# Patient Record
Sex: Male | Born: 1985 | Race: White | Hispanic: No | Marital: Married | Smoking: Never smoker
Health system: Southern US, Community
[De-identification: ages and names within clinical notes are randomized; demographics above are authoritative.]

## PROBLEM LIST (undated history)

## (undated) DIAGNOSIS — M109 Gout, unspecified: Secondary | ICD-10-CM

## (undated) DIAGNOSIS — E079 Disorder of thyroid, unspecified: Secondary | ICD-10-CM

## (undated) HISTORY — DX: Disorder of thyroid, unspecified: E07.9

## (undated) HISTORY — DX: Gout, unspecified: M10.9

## (undated) HISTORY — PX: VASECTOMY: SHX75

---
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
## 2010-12-29 HISTORY — PX: RETINAL DETACHMENT SURGERY: SHX105

## 2010-12-29 HISTORY — PX: LASIK: SHX215

## 2010-12-29 HISTORY — PX: EYE SURGERY: SHX253

## 2011-12-30 HISTORY — PX: RHINOPLASTY: SHX2354

## 2012-12-09 ENCOUNTER — Ambulatory Visit: Payer: Self-pay | Admitting: Family Medicine

## 2013-03-03 ENCOUNTER — Ambulatory Visit: Payer: Self-pay | Admitting: Otolaryngology

## 2013-03-04 LAB — PATHOLOGY REPORT

## 2013-12-13 ENCOUNTER — Ambulatory Visit (INDEPENDENT_AMBULATORY_CARE_PROVIDER_SITE_OTHER): Payer: 59

## 2013-12-13 ENCOUNTER — Ambulatory Visit (INDEPENDENT_AMBULATORY_CARE_PROVIDER_SITE_OTHER): Payer: 59 | Admitting: Podiatry

## 2013-12-13 ENCOUNTER — Encounter: Payer: Self-pay | Admitting: Podiatry

## 2013-12-13 VITALS — BP 136/86 | HR 66 | Resp 16 | Ht 70.0 in | Wt 250.0 lb

## 2013-12-13 DIAGNOSIS — M79609 Pain in unspecified limb: Secondary | ICD-10-CM

## 2013-12-13 DIAGNOSIS — M79672 Pain in left foot: Secondary | ICD-10-CM

## 2013-12-13 DIAGNOSIS — M109 Gout, unspecified: Secondary | ICD-10-CM

## 2013-12-13 DIAGNOSIS — M779 Enthesopathy, unspecified: Secondary | ICD-10-CM

## 2013-12-13 DIAGNOSIS — S93409A Sprain of unspecified ligament of unspecified ankle, initial encounter: Secondary | ICD-10-CM

## 2013-12-13 MED ORDER — TRIAMCINOLONE ACETONIDE 10 MG/ML IJ SUSP
10.0000 mg | Freq: Once | INTRAMUSCULAR | Status: AC
  Administered 2013-12-13: 10 mg

## 2013-12-13 MED ORDER — DICLOFENAC SODIUM 75 MG PO TBEC: 75.0000 mg | DELAYED_RELEASE_TABLET | Freq: Two times a day (BID) | ORAL | Status: DC

## 2013-12-13 NOTE — Progress Notes (Signed)
   Subjective:    Patient ID: Oscar Allen, male    DOB: June 30, 1986, 27 y.o.   MRN: [id number]  HPI Comments: N PAIN L LEFT FOOT AND ANKLE D 1 YR O SLOWLY C WORSE A WALKING, ALL MOTIONS OF FOOT T IBUPROFEN, ICE, STRETCHING,     Foot Pain Associated symptoms include headaches.      Review of Systems  Constitutional: Negative.   Eyes: Negative.   Respiratory: Negative.   Cardiovascular: Negative.   Gastrointestinal: Positive for constipation.  Endocrine: Negative.   Genitourinary: Negative.   Musculoskeletal:       JOINT PAIN DIFFICULTY WALKING  Skin: Negative.   Allergic/Immunologic: Negative.   Neurological: Positive for headaches.  Hematological: Negative.   Psychiatric/Behavioral: Negative.        Objective:   Physical Exam        Assessment & Plan:

## 2013-12-13 NOTE — Progress Notes (Signed)
Subjective:     Patient ID: Oscar Allen, male   DOB: 10/28/1986, 27 y.o.   MRN: [id number]  Foot Pain   patient presents stating I'm having a lot of pain in my left ankle and now on the outside of my left foot has become sore patient states that this has been going on for about a year after helping a friend move and he did have it checked at that time and he did have elevated uric acid on blood work .patient states it's worse with activity    Review of Systems  All other systems reviewed and are negative.       Objective:   Physical Exam  Nursing note and vitals reviewed. Constitutional: He is oriented to person, place, and time.  Cardiovascular: Intact distal pulses.   Musculoskeletal: Normal range of motion.  Neurological: He is oriented to person, place, and time.  Skin: Skin is warm.   neurovascular status intact with no muscle strength loss or loss of motion in the subtalar midtarsal joint. No equinus condition no crepitus was noted upon inversion eversion of the subtalar or ankle joint pain is present underneath the fibula on the lateral side of the ankle and at the peroneal brevis insertion fifth metatarsal     Assessment:    Cannot rule out systemic disease versus localized inflammation versus possibility for some kind of subtle arthritis local process     Plan:     H&P and foot and ankle x-rays reviewed. Today I injected the lateral gutter of the fibula 3 mg Kenalog 500 Xylocaine Marcaine mixture and at the peroneal base two milligrams Kenalog 3 mg Xylocaine and dispensed air fracture walker for complete immobilization of the ankle and placed on Voltaren 75 mg twice a day. I am sending for blood work to rule out systemic disease

## 2013-12-14 LAB — ANA: Anti Nuclear Antibody(ANA): NEGATIVE

## 2013-12-14 LAB — RHEUMATOID FACTOR: Rhuematoid fact SerPl-aCnc: 10 IU/mL (ref 0.0–13.9)

## 2013-12-14 LAB — C-REACTIVE PROTEIN: CRP: 5.8 mg/L — ABNORMAL HIGH (ref 0.0–4.9)

## 2013-12-16 ENCOUNTER — Telehealth: Payer: Self-pay | Admitting: *Deleted

## 2013-12-16 NOTE — Telephone Encounter (Signed)
Pt called was in severe pain, spoke with dr Charlsie Merles about this i called him in a prescription to [facility] sterapred dos pak 12 day. He is away for the holidays , also gave him the results of his uric acid which was elevated.

## 2013-12-30 ENCOUNTER — Telehealth: Payer: Self-pay | Admitting: *Deleted

## 2013-12-30 MED ORDER — PREDNISONE 10 MG PO TABS: ORAL_TABLET | ORAL | Status: DC

## 2013-12-30 NOTE — Telephone Encounter (Signed)
Patient called requesting possible meds or appt. States that he does have appt on Monday with Dr Milinda Pointer in [place], but his foot was hurting a lot. States that the 5th MPJ is sore. Wearing an air fracture walker and taking Diclofenac Rx'd by Dr Paulla Dolly at last visit. Requesting something else to help until appt on Monday. I adivsed to continue with boot and I would check with the doc in office today.

## 2013-12-30 NOTE — Telephone Encounter (Signed)
E-scribed Sterapred dose pack 12 day per Dr Valentina Lucks. Called pt to let him know. Also advised to continue with air fracture walker and rest as much as possible over the weekend.

## 2014-01-02 ENCOUNTER — Ambulatory Visit (INDEPENDENT_AMBULATORY_CARE_PROVIDER_SITE_OTHER): Payer: 59

## 2014-01-02 ENCOUNTER — Ambulatory Visit (INDEPENDENT_AMBULATORY_CARE_PROVIDER_SITE_OTHER): Payer: 59 | Admitting: Podiatry

## 2014-01-02 ENCOUNTER — Encounter: Payer: Self-pay | Admitting: Podiatry

## 2014-01-02 ENCOUNTER — Ambulatory Visit: Payer: Self-pay

## 2014-01-02 VITALS — BP 146/93 | HR 75 | Resp 16

## 2014-01-02 DIAGNOSIS — M7750 Other enthesopathy of unspecified foot: Secondary | ICD-10-CM

## 2014-01-02 DIAGNOSIS — M79609 Pain in unspecified limb: Secondary | ICD-10-CM

## 2014-01-02 DIAGNOSIS — M79672 Pain in left foot: Secondary | ICD-10-CM

## 2014-01-02 MED ORDER — OXYCODONE-ACETAMINOPHEN 10-325 MG PO TABS
ORAL_TABLET | ORAL | Status: DC
Start: 2014-01-02 — End: 2015-08-02

## 2014-01-02 NOTE — Progress Notes (Signed)
   Subjective:    Patient ID: Oscar Allen, male    DOB: [birth year], 28 y.o.   MRN: [id number]  HPI Comments: Saw dr Paulla Dolly back in December was diagnosed with gout still having a ton of pain ,its staying around the 5th met lateral side and radiates down the side of the foot, gets worse at night, been using the boot and diclofenac , took the first round of steroid and it tore up my stomach. Did not take the second round that dr egerton prescribed.     Review of Systems     Objective:   Physical Exam: I have reviewed his past medical history medications allergies surgical history and labs. He continues to have pain about the fifth metatarsal and fifth metatarsal base as well as the peroneal tendons. Elevated uric acid levels are indicative of gout however his pain has changed very little and on for evaluation I see no signs of typical gout. He has tenderness on palpation of the fifth metatarsal base of the left foot. He also has pain on plantar flexion and abduction against resistance as well as pain on abduction against resistance. There is no overlying edema. Radiographic evaluation does not demonstrate any type of osseous abnormalities other than some soft tissue increase in density of the peroneal tendons as they course beneath the lateral malleolus extending to the level of the arcuate portion of the cuboid and the fifth metatarsal base.        Assessment & Plan:  Assessment: At this point I am concerned that he has rather gout but a tear of the peroneal brevis or peroneal longus tendons.  Plan: We discussed the etiology pathology conservative versus surgical therapies. We also discussed the fact that he has hyperuricemia and this very well may need to be treated as well. But at this point since the boot seems to help particularly with the tendons, I feel we should leave him in the boot for another 6 weeks. I also wrote her prescription for Percocet 7.5-325 mg #60, take 1-2 tablets by  mouth every 6-8 hours.

## 2014-01-04 ENCOUNTER — Ambulatory Visit: Payer: 59 | Admitting: Podiatry

## 2014-02-08 ENCOUNTER — Ambulatory Visit (INDEPENDENT_AMBULATORY_CARE_PROVIDER_SITE_OTHER): Payer: 59

## 2014-02-08 ENCOUNTER — Ambulatory Visit (INDEPENDENT_AMBULATORY_CARE_PROVIDER_SITE_OTHER): Payer: 59 | Admitting: Podiatry

## 2014-02-08 ENCOUNTER — Encounter: Payer: Self-pay | Admitting: Podiatry

## 2014-02-08 VITALS — BP 134/91 | HR 77 | Resp 16

## 2014-02-08 DIAGNOSIS — M7750 Other enthesopathy of unspecified foot: Secondary | ICD-10-CM

## 2014-02-08 DIAGNOSIS — M7670 Peroneal tendinitis, unspecified leg: Secondary | ICD-10-CM

## 2014-02-08 DIAGNOSIS — M109 Gout, unspecified: Secondary | ICD-10-CM

## 2014-02-08 MED ORDER — DICLOFENAC SODIUM 75 MG PO TBEC: 75.0000 mg | DELAYED_RELEASE_TABLET | Freq: Two times a day (BID) | ORAL | Status: DC

## 2014-02-08 MED ORDER — OXYCODONE-ACETAMINOPHEN 10-325 MG PO TABS: ORAL_TABLET | ORAL | Status: DC

## 2014-02-08 NOTE — Progress Notes (Signed)
Two weeks ago it was doing real good and about 5-6 days ago it flared up again out of the blue. He denies fever chills nausea vomiting muscle aches or pains or any trauma to the foot. States that he may the overdone it while at a convention.  Objective: Vital signs are stable he is alert and oriented x3. There is no erythema edema cellulitis drainage or odor to the left foot. Pulses are palpable left foot. He still has pain on palpation of the peroneal tendons from the ankle distal to the fifth metatarsal into the cuboid. He also has tenderness on the dorsal aspect of the foot and within the mid arch of the foot deep. He does have a nonpulsatile soft movable mass to the dorsal aspect of the foot that is visible on radiograph. Radiograph does not demonstrate any type of osseous abnormalities. There is no erythema edema cellulitis drainage or odor to the foot.  Assessment: Probable peroneal tendinitis with split tear left.  Plan: Discussed etiology pathology conservative versus surgical therapies. At this point I'm going to send him for blood work consisting of an arthritic profile. I refilled his diclofenac as well as his Percocet. 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 He would like to take a look at his peroneal tendons to see if there is a tear. I encouraged him to do so. Should his blood work come back abnormal we will correct with medications if necessary. Otherwise at the blood work is normal an MRI of the left foot would be necessary.

## 2014-02-09 ENCOUNTER — Other Ambulatory Visit: Payer: Self-pay | Admitting: Podiatry

## 2014-02-09 LAB — C-REACTIVE PROTEIN: CRP: 4 mg/L (ref 0.0–4.9)

## 2014-02-09 LAB — URIC ACID: Uric Acid: 8.8 mg/dL — ABNORMAL HIGH (ref 3.7–8.6)

## 2014-02-09 LAB — ANA: ANA: NEGATIVE

## 2014-02-09 LAB — RHEUMATOID FACTOR: Rhuematoid fact SerPl-aCnc: 9.3 IU/mL (ref 0.0–13.9)

## 2014-02-09 LAB — SEDIMENTATION RATE: SED RATE: 21 mm/h — AB (ref 0–15)

## 2014-02-09 MED ORDER — COLCHICINE 0.6 MG PO TABS: 0.6000 mg | ORAL_TABLET | Freq: Every day | ORAL | Status: DC

## 2014-02-10 ENCOUNTER — Telehealth: Payer: Self-pay | Admitting: *Deleted

## 2014-02-10 NOTE — Telephone Encounter (Signed)
Left message for Oscar Allen regarding his lab results.

## 2014-02-10 NOTE — Telephone Encounter (Signed)
Message copied by Dierdre Searles on Fri Feb 10, 2014  8:33 AM ------      Message from: Tyson Dense T      Created: Thu Feb 09, 2014  5:21 PM       Hyper uricemia (gout) with elevated inflammatory markers.  Start on colchicine ------

## 2014-02-22 ENCOUNTER — Ambulatory Visit (INDEPENDENT_AMBULATORY_CARE_PROVIDER_SITE_OTHER): Payer: 59 | Admitting: Podiatry

## 2014-02-22 VITALS — BP 135/87 | HR 70 | Resp 16 | Ht 70.0 in | Wt 252.0 lb

## 2014-02-22 DIAGNOSIS — M7750 Other enthesopathy of unspecified foot: Secondary | ICD-10-CM

## 2014-02-22 DIAGNOSIS — S86319A Strain of muscle(s) and tendon(s) of peroneal muscle group at lower leg level, unspecified leg, initial encounter: Secondary | ICD-10-CM

## 2014-02-22 DIAGNOSIS — M7670 Peroneal tendinitis, unspecified leg: Secondary | ICD-10-CM

## 2014-02-22 DIAGNOSIS — S838X9A Sprain of other specified parts of unspecified knee, initial encounter: Secondary | ICD-10-CM

## 2014-02-22 DIAGNOSIS — S86819A Strain of other muscle(s) and tendon(s) at lower leg level, unspecified leg, initial encounter: Secondary | ICD-10-CM

## 2014-02-22 DIAGNOSIS — M109 Gout, unspecified: Secondary | ICD-10-CM

## 2014-02-22 MED ORDER — ALLOPURINOL 100 MG PO TABS: 100.0000 mg | ORAL_TABLET | Freq: Two times a day (BID) | ORAL | Status: DC

## 2014-02-23 NOTE — Progress Notes (Signed)
He presents today continually wearing the Cam Walker for his left foot secondary to peroneal tendinitis. He also has a history of gout in which she is taking colchicine. He's been taking colchicine daily with some mild stomach upset. He has related decrease in pain to the posterior lateral ankle and lateral left foot. He relates that one of his attendings I performed a diagnostic ultrasound over his lateral ankle and peroneal groove and it did appear to demonstrate a tear of his peroneal tendon.  Objective: Vital signs are stable alert and oriented x3. He has some tenderness on palpation of the peroneal tendons but appears to be less since then previously noted. Currently there is no erythema edema cellulitis drainage or odor to the first metatarsophalangeal joint lateral foot or ankle.  Assessment: History of gout with positive for elevated uric acid levels. Probable split tear of the peroneal tendon group left ankle.  Plan: Discussed etiology pathology conservative versus surgical therapies currently we are going to discontinue colchicine and start allopurinol 200 mg daily I will followup with him in 6 weeks to reevaluate and perform a blood uric acid level.

## 2014-04-05 ENCOUNTER — Ambulatory Visit: Payer: 59 | Admitting: Podiatry

## 2014-04-13 ENCOUNTER — Telehealth: Payer: Self-pay | Admitting: *Deleted

## 2014-04-13 MED ORDER — METHYLPREDNISOLONE (PAK) 4 MG PO TABS: ORAL_TABLET | ORAL | Status: DC

## 2014-04-13 NOTE — Telephone Encounter (Signed)
Pt called said he had a gout flare up and is taking colchicine and its not helping. Pt states he has had the flare up for one week in his great toe.  i left message for pt to return call.

## 2014-04-13 NOTE — Telephone Encounter (Signed)
Pt had gout flare up already taken colchicine .Marland Kitchen Ok per dr Valentina Lucks

## 2014-04-13 NOTE — Telephone Encounter (Signed)
Dr Milinda Pointer pt called said he has a gout flare up in his great toe and its been like that for one week. States he is taking colchicine and its not working. Wants to know whats next. i left him a message asking him to call [facility] office and make sure we have correct pharmacy in case he needed a different rx.

## 2014-04-13 NOTE — Telephone Encounter (Signed)
Steroid dose pack-- I think Jody already took care of this

## 2014-04-21 ENCOUNTER — Ambulatory Visit (INDEPENDENT_AMBULATORY_CARE_PROVIDER_SITE_OTHER): Payer: 59

## 2014-04-21 ENCOUNTER — Ambulatory Visit (INDEPENDENT_AMBULATORY_CARE_PROVIDER_SITE_OTHER): Payer: 59 | Admitting: Podiatrist

## 2014-04-21 VITALS — BP 125/86 | HR 70 | Resp 16

## 2014-04-21 DIAGNOSIS — M109 Gout, unspecified: Secondary | ICD-10-CM

## 2014-04-21 MED ORDER — METHYLPREDNISOLONE (PAK) 4 MG PO TABS: ORAL_TABLET | ORAL | Status: DC

## 2014-04-21 MED ORDER — HYDROCODONE-ACETAMINOPHEN 5-325 MG PO TABS: 1.0000 | ORAL_TABLET | ORAL | Status: DC | PRN

## 2014-04-21 MED ORDER — COLCHICINE 0.6 MG PO TABS: 0.6000 mg | ORAL_TABLET | Freq: Every day | ORAL | Status: DC

## 2014-04-21 NOTE — Patient Instructions (Signed)

## 2014-04-28 NOTE — Progress Notes (Signed)
Chief Complaint  Patient presents with  . Foot Pain    right great toe is still hurting, finished prednisone      HPI: Patient is 28 y.o. male who presents today for pain in his right foot.  He states it is still very painful despite taking the medication for gout.  His peroneal tendon feels fine now and he is having no further issues with it.  He is getting ready to go abroad on Monday for a rotation in school.       Physical Exam Neurovascular status intact and unchanged from his last visit.  He has pain on his right foot at the first MPJ and it is swollen, red and tender.      Assessment: gout flare right  Plan: injected the right foot with kenalog and marcaine mix.  Recommended a steroid dose pack and a rx was written.  He is to continue with the allopurinol as rx'd by dr. Milinda Pointer.  He will call if the pain does not improve.

## 2015-01-22 LAB — LIPID PANEL
Cholesterol: 192 mg/dL (ref 0–200)
HDL: 49 mg/dL (ref 35–70)
LDL CALC: 114 mg/dL
Triglycerides: 143 mg/dL (ref 40–160)

## 2015-02-11 ENCOUNTER — Ambulatory Visit: Payer: Self-pay | Admitting: Internal Medicine

## 2015-04-20 NOTE — Op Note (Signed)
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   OPERATION:   Septoplasty. Bilateral submucous resection of the inferior turbinates.   SURGEON:  Janalee Dane, MD  ANESTHESIA:  General  FINDINGS: The anterior septum on the left side, especially at Little's area, was very cobblestoned and the mucoperichondrium was very adherent there. I did send two pieces to Pathology for analysis. There were no overlying lesions. There was some crusting on the mucosa, but no obvious erosion or lesion. The septum was deviated to the left inferiorly, dislocated out of the maxillary crest, and to the right more superiorly. The inferior turbinates were severely hypertrophied.   DESCRIPTION OF PROCEDURE:  The patient was identified in the holding area and was brought back to the operating room in the supine position on the operating room table.  After general endotracheal anesthesia had been induced the patient was turned 90 degrees counter clockwise from anesthesia.  The nose was anesthetized with infraorbital nerve blocks and septal injection with 0.5% Lidocaine and 0.25% Bupivacaine mixed with 1:150,000 with Epinephrine and phenylephrine Lidocaine soaked pledgets, two on each side were placed and the face was prepped and draped in the usual fashion.  The pledgets were removed.  A 15 blade was used to make a left-sided hemitransfixion incision and septal mucoperichondrial mucoperiosteal leaflets elevated.  There was a large inferior spur that was resected with Jansen-Middleton forceps.  The remaining septum was deviated back and forth in an accordion like fashion.  The bony cartilaginous junction was then divided and a moderate amount of vomer and perpendicular  plate was taken down with Jansen-Middleton forceps, releasing the tension on the remaining septum.  The septum then swung back into the midline.  The septal leaflets were closed with quilting 4-0 chromic suture.  The left sided hemitransfixion incision was closed with 4-0 plain gut.  Attention was directed to the turbinates which had been previously injected on the left.  The head of the inferior turbinate on the left was incised with a 15 blade and the medial mucoperiosteum was elevated using a Psychologist, educational.  Once this had been elevated Knight scissors were used to resect the conchal bone and lateral mucoperiosteum.  The inferior margin of the remaining mucoperiosteum was then cauterized with suction cautery and Surgiflo was placed at the inferior to the inferior margin of the remaining inferior turbinate.  An identical procedure was performed on the right inferior turbinate with once again placement of Surgiflo along its inferior margin.  Temporary Telfa pledgets were then placed.  The patient was allowed to emerge from anesthesia, extubated in the operating room and taken to the recovery room in stable condition.  There were no complications.    BLOOD LOSS: 10 mL.    ____________________________ J. Nadeen Landau, MD jmc:dm D: 03/03/2013 14:14:00 ET T: 03/03/2013 14:50:00 ET JOB#: [id number]  cc: Janalee Dane, MD, <Dictator>  Nicholos Johns MD ELECTRONICALLY SIGNED 03/21/2013 7:45

## 2015-07-13 ENCOUNTER — Ambulatory Visit: Payer: Self-pay | Admitting: Family Medicine

## 2015-08-02 ENCOUNTER — Ambulatory Visit
Admission: EM | Admit: 2015-08-02 | Discharge: 2015-08-02 | Disposition: A | Discharge status: Home / Self Care | Payer: 59 | Attending: Internal Medicine | Admitting: Internal Medicine

## 2015-08-02 ENCOUNTER — Encounter: Payer: Self-pay | Admitting: Emergency Medicine

## 2015-08-02 ENCOUNTER — Ambulatory Visit: Payer: 59

## 2015-08-02 DIAGNOSIS — Z79899 Other long term (current) drug therapy: Secondary | ICD-10-CM | POA: Diagnosis not present

## 2015-08-02 DIAGNOSIS — M109 Gout, unspecified: Secondary | ICD-10-CM | POA: Insufficient documentation

## 2015-08-02 DIAGNOSIS — E079 Disorder of thyroid, unspecified: Secondary | ICD-10-CM | POA: Insufficient documentation

## 2015-08-02 DIAGNOSIS — J209 Acute bronchitis, unspecified: Secondary | ICD-10-CM | POA: Diagnosis not present

## 2015-08-02 DIAGNOSIS — J40 Bronchitis, not specified as acute or chronic: Secondary | ICD-10-CM

## 2015-08-02 DIAGNOSIS — R05 Cough: Secondary | ICD-10-CM | POA: Diagnosis present

## 2015-08-02 MED ORDER — HYDROCOD POLST-CPM POLST ER 10-8 MG/5ML PO SUER: 5.0000 mL | Freq: Two times a day (BID) | ORAL | Status: DC

## 2015-08-02 MED ORDER — IPRATROPIUM-ALBUTEROL 0.5-2.5 (3) MG/3ML IN SOLN
3.0000 mL | Freq: Once | RESPIRATORY_TRACT | Status: AC
  Administered 2015-08-02: 3 mL via RESPIRATORY_TRACT

## 2015-08-02 MED ORDER — ALBUTEROL SULFATE HFA 108 (90 BASE) MCG/ACT IN AERS: 1.0000 | INHALATION_SPRAY | Freq: Four times a day (QID) | RESPIRATORY_TRACT | Status: DC | PRN

## 2015-08-02 MED ORDER — AZITHROMYCIN 250 MG PO TABS: ORAL_TABLET | ORAL | Status: DC

## 2015-08-02 MED ORDER — PREDNISONE 20 MG PO TABS
ORAL_TABLET | ORAL | Status: DC
Start: 2015-08-02 — End: 2015-08-31

## 2015-08-02 NOTE — ED Provider Notes (Addendum)
CSN: [id number]     Arrival date & time 08/02/15  1804 History   First MD Initiated Contact with Patient 08/02/15 1833     Chief Complaint  Patient presents with  . 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 He's never had any lung issues in the past. Denies fever or chills. He states he is having difficulty taking in deep breaths. He has never smoked.  Past Medical History  Diagnosis Date  . Thyroid disorder   . Gout    Past Surgical History  Procedure Laterality Date  . Foot surgery Right   . Mandible fracture surgery    . Rhinoplasty    . Eye surgery Bilateral    History reviewed. No pertinent family history. History  Substance Use Topics  . Smoking status: Never Smoker   . Smokeless tobacco: Never Used  . Alcohol Use: Yes     Comment: OCCASIONALLY    Review of Systems  Respiratory: Positive for cough, chest tightness, shortness of breath and wheezing.   All other systems reviewed and are negative.   Allergies  Review of patient's allergies indicates no known allergies.  Home Medications   Prior to Admission medications   Medication Sig Start Date End Date Taking? Authorizing Provider  Acetaminophen (TYLENOL PO) Take by mouth as needed.    Historical Provider, MD  albuterol (PROVENTIL HFA;VENTOLIN HFA) 108 (90 BASE) MCG/ACT inhaler Inhale 1-2 puffs into the lungs every 6 (six) hours as needed for wheezing or shortness of breath. 08/02/15   Lorin Picket, PA-C  allopurinol (ZYLOPRIM) 100 MG tablet Take 1 tablet (100 mg total) by mouth 2 (two) times daily. 02/22/14   Max T Hyatt, DPM  Arginine 1000 MG TABS Take by mouth daily.    Historical Provider, MD  azithromycin (ZITHROMAX Z-PAK) 250 MG tablet Take per package instructions 08/02/15   Lorin Picket, PA-C   chlorpheniramine-HYDROcodone [facility]) 10-8 MG/5ML SUER Take 5 mLs by mouth 2 (two) times daily. 08/02/15   Lorin Picket, PA-C  cholecalciferol (VITAMIN D) 1000 UNITS tablet Take 1,000 Units by mouth daily.    Historical Provider, MD  clonazePAM (KLONOPIN) 0.5 MG tablet Take 0.5 mg by mouth as needed for anxiety.    Historical Provider, MD  diclofenac (VOLTAREN) 75 MG EC tablet Take 1 tablet (75 mg total) by mouth 2 (two) times daily. 12/13/13   Wallene Huh, DPM  diclofenac (VOLTAREN) 75 MG EC tablet Take 1 tablet (75 mg total) by mouth 2 (two) times daily. 02/08/14   Max T Hyatt, DPM  Ibuprofen (ADVIL PO) Take by mouth as needed.    Historical Provider, MD  levothyroxine (SYNTHROID, LEVOTHROID) 75 MCG tablet Take 75 mcg by mouth daily before breakfast.    Historical Provider, MD  Multiple Vitamins-Minerals (MULTIVITAMIN PO) Take by mouth daily.    Historical Provider, MD  predniSONE (DELTASONE) 20 MG tablet Take 2 tabs (40 mg total) daily for 4 days 08/02/15   Lorin Picket, PA-C   BP 137/75 mmHg  Pulse 56  Temp(Src) 98.3 F (36.8 C) (Oral)  Resp 16  SpO2 98% Physical Exam  Constitutional: He is oriented to person, place, and time. He appears well-developed and well-nourished.  HENT:  Head: Normocephalic and atraumatic.  Right Ear: External ear normal.  Left Ear: External ear normal.  Eyes: Pupils are equal, round,  and reactive to light.  Neck: Neck supple. No tracheal deviation present.  Cardiovascular: Normal rate, regular rhythm and normal heart sounds.  Exam reveals no gallop and no friction rub.   No murmur heard. Pulmonary/Chest: Effort normal. No respiratory distress. He has wheezes. He has no rales. He exhibits no tenderness.  Examination lungs harsh breath sounds throughout. He has loud rattly wheezing with expiration. No areas of consolidation or rales are appreciated.  Lymphadenopathy:    He has no cervical adenopathy.  Neurological: He is alert and  oriented to person, place, and time.  Skin: Skin is warm and dry.  Psychiatric: He has a normal mood and affect. His behavior is normal. Judgment and thought content normal.  Nursing note and vitals reviewed.   ED Course  Procedures (including critical care time) Labs Review Labs Reviewed - No data to display  Imaging Review No results found. 19:00 Medication Given TL  ipratropium-albuterol (DUONEB) 0.5-2.5 (3) MG/3ML nebulizer solution 3 mL - Dose: 3 mL ; Route: Nebulization ; Scheduled Time: 1900       MDM   1. Bronchitis with bronchospasm    Discharge Medication List as of 08/02/2015  7:41 PM    START taking these medications   Details  albuterol (PROVENTIL HFA;VENTOLIN HFA) 108 (90 BASE) MCG/ACT inhaler Inhale 1-2 puffs into the lungs every 6 (six) hours as needed for wheezing or shortness of breath., Starting 08/02/2015, Until Discontinued, Print    azithromycin (ZITHROMAX Z-PAK) 250 MG tablet Take per package instructions, Print    chlorpheniramine-HYDROcodone (TUSSIONEX PENNKINETIC ER) 10-8 MG/5ML SUER Take 5 mLs by mouth 2 (two) times daily., Starting 08/02/2015, Until Discontinued, Print    predniSONE (DELTASONE) 20 MG tablet Take 2 tabs (40 mg total) daily for 4 days, Print        Plan: 1. Test/x-ray results and diagnosis reviewed with patient 2. rx as per orders; risks, benefits, potential side effects reviewed with patient 3. Recommend supportive treatment with rest and fluids. 4. F/u with PCP or pulmonologist if not improving.  Lorin Picket, PA-C 08/02/15 1943  Lorin Picket, PA-C 08/06/15 [phone]

## 2015-08-02 NOTE — ED Notes (Signed)
Pt states that he has had a cough for 2 weeks with no relief of OTC medications.

## 2015-08-02 NOTE — Discharge Instructions (Signed)

## 2015-08-08 IMAGING — CR DG CHEST 2V
2 series · 2 of 2 positions shown · non-contrast
Comparison: None.

CLINICAL DATA: Productive cough with wheezing

EXAM:
CHEST  2 VIEW

[chest pa]
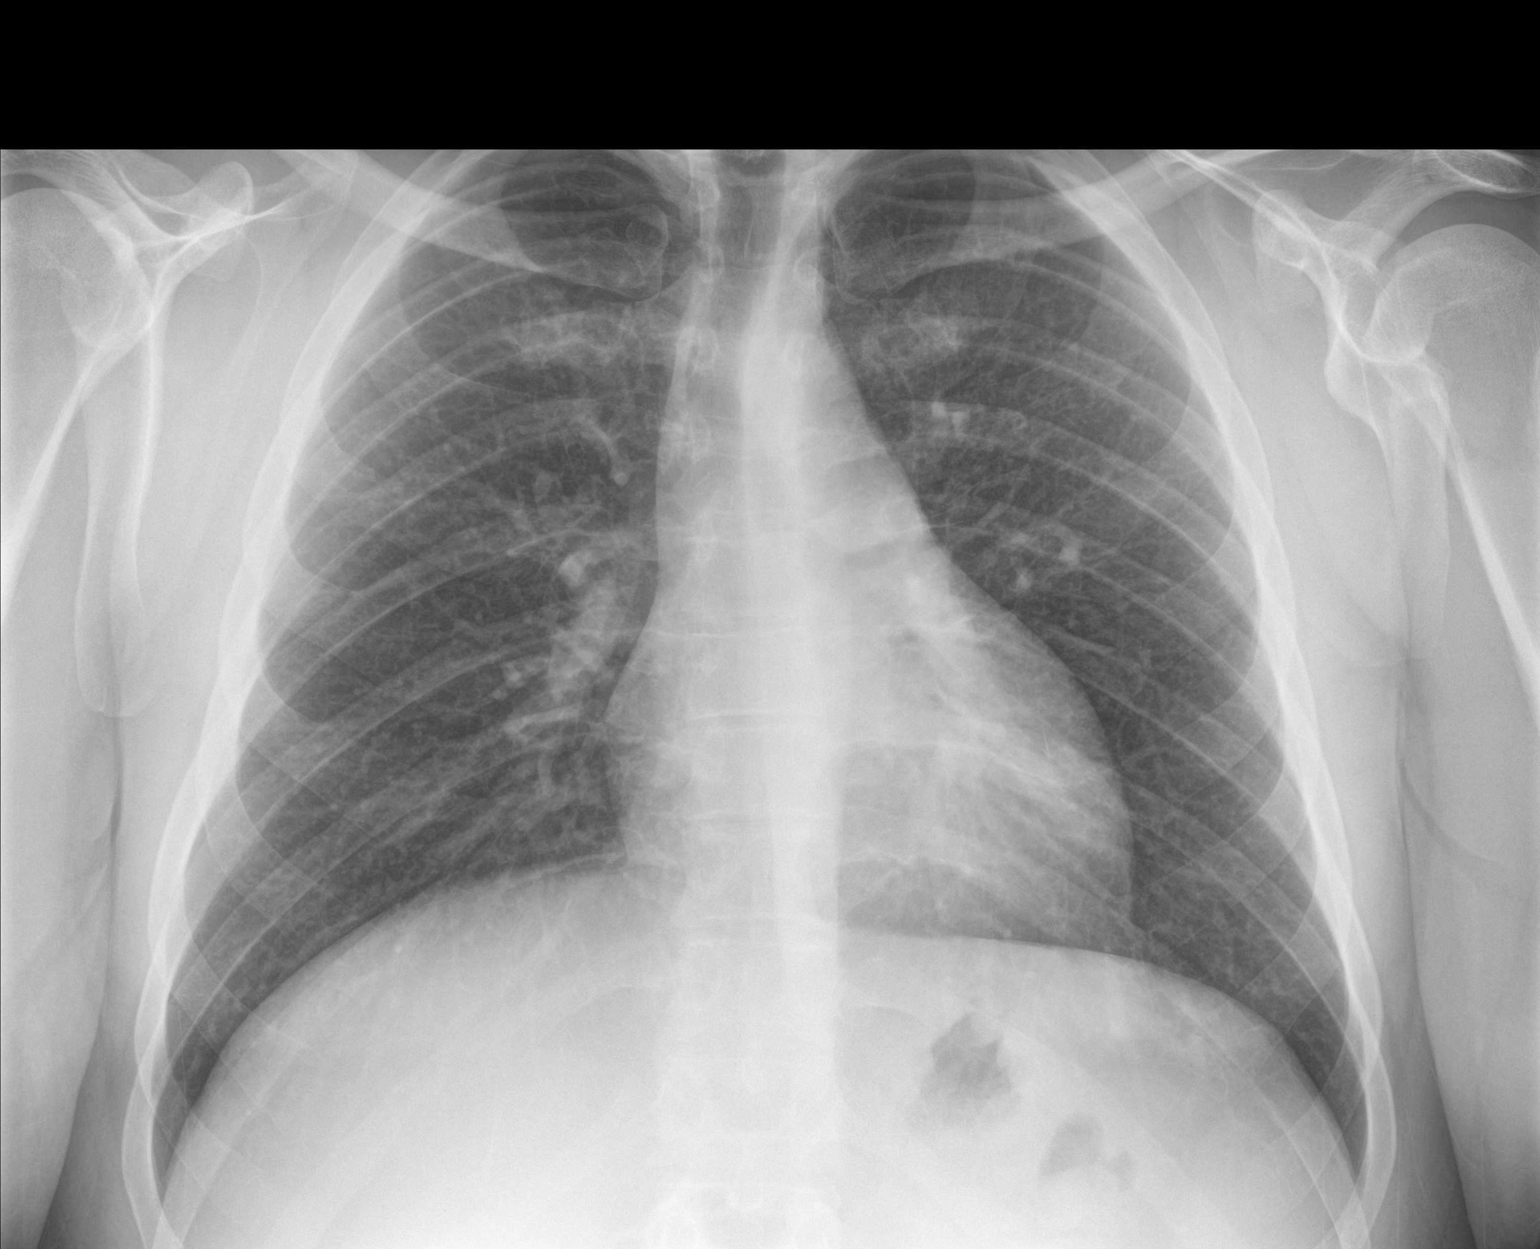

[chest lat]
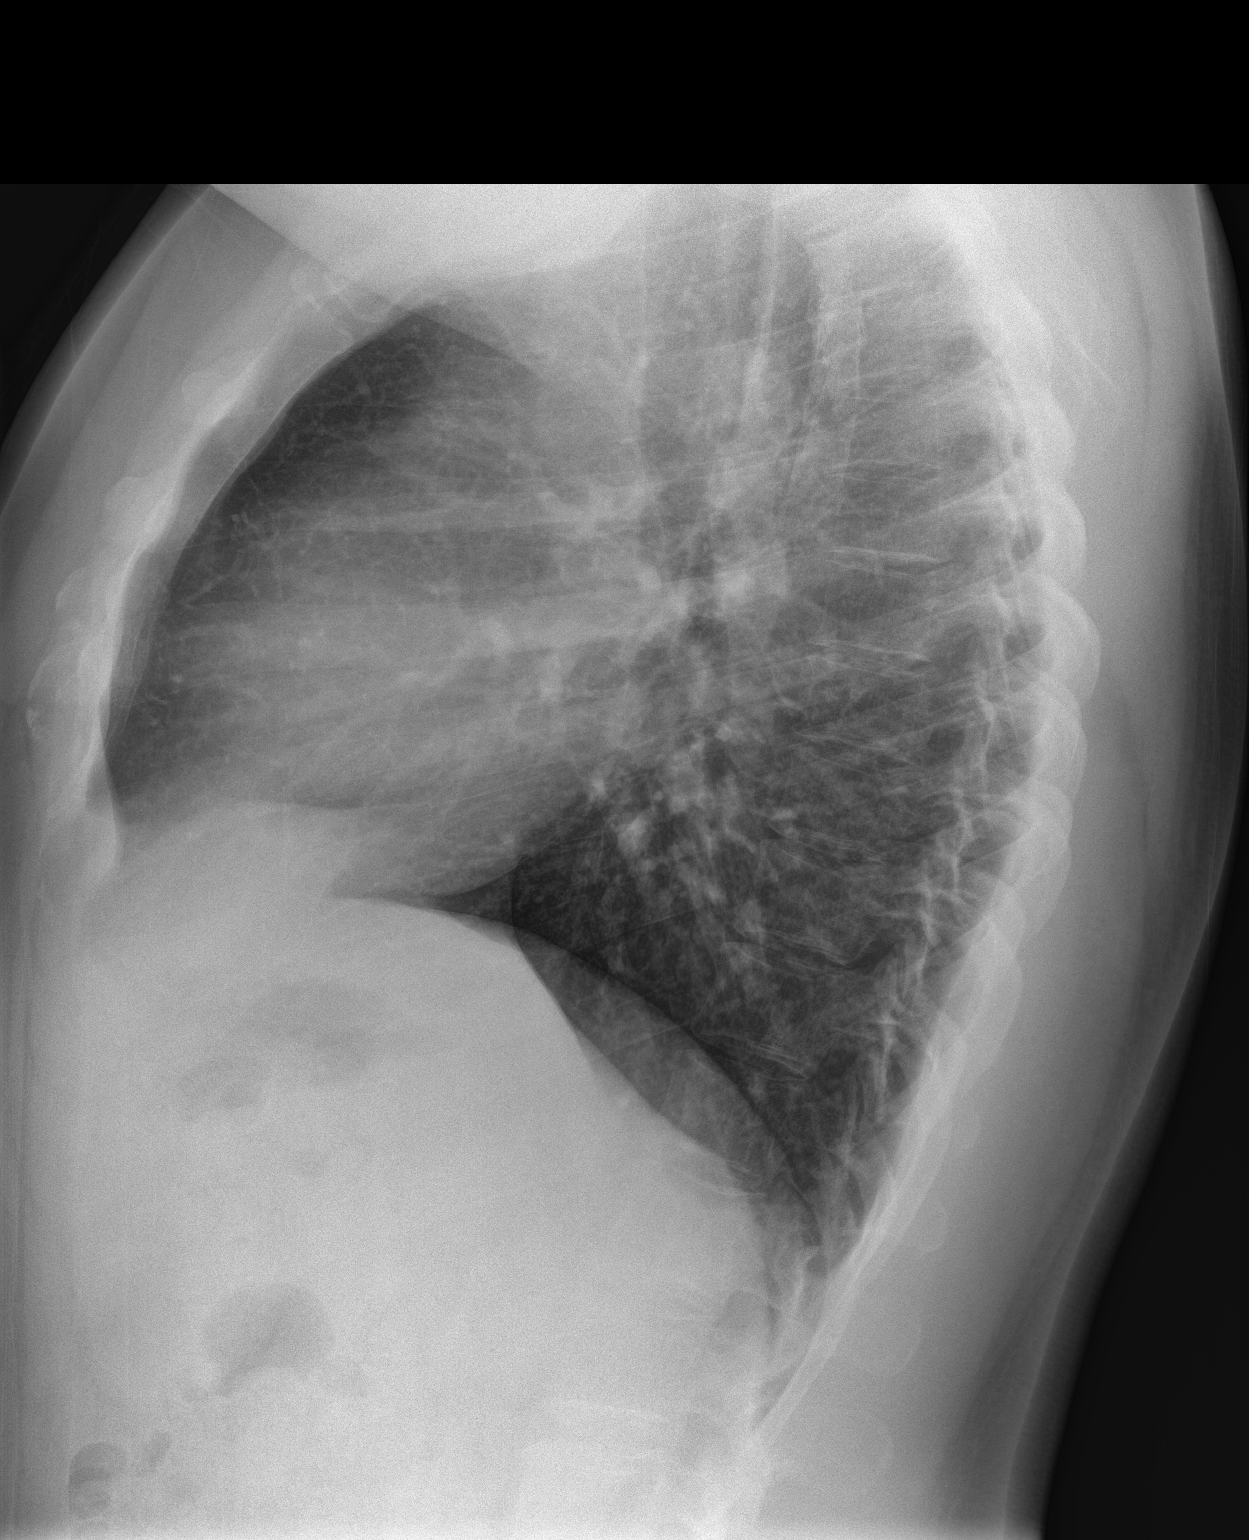

[2 of 2 positions shown; findings below may reference images not displayed]

FINDINGS: Prominent interstitial markings with tram tracking and bronchial
cuffing. No consolidation or collapse. No edema, effusion, or
pneumothorax. Normal heart size and mediastinal contours.
IMPRESSION: Bronchitis without pneumonia or collapse.

## 2015-08-26 ENCOUNTER — Encounter: Payer: Self-pay | Admitting: Family Medicine

## 2015-08-26 DIAGNOSIS — M109 Gout, unspecified: Secondary | ICD-10-CM | POA: Insufficient documentation

## 2015-08-26 DIAGNOSIS — E039 Hypothyroidism, unspecified: Secondary | ICD-10-CM | POA: Insufficient documentation

## 2015-08-26 DIAGNOSIS — Z8679 Personal history of other diseases of the circulatory system: Secondary | ICD-10-CM | POA: Insufficient documentation

## 2015-08-26 DIAGNOSIS — E669 Obesity, unspecified: Secondary | ICD-10-CM | POA: Insufficient documentation

## 2015-08-26 DIAGNOSIS — G4700 Insomnia, unspecified: Secondary | ICD-10-CM | POA: Insufficient documentation

## 2015-08-26 DIAGNOSIS — E538 Deficiency of other specified B group vitamins: Secondary | ICD-10-CM | POA: Insufficient documentation

## 2015-08-26 DIAGNOSIS — E559 Vitamin D deficiency, unspecified: Secondary | ICD-10-CM | POA: Insufficient documentation

## 2015-08-31 ENCOUNTER — Encounter: Payer: Self-pay | Admitting: Family Medicine

## 2015-08-31 ENCOUNTER — Ambulatory Visit (INDEPENDENT_AMBULATORY_CARE_PROVIDER_SITE_OTHER): Payer: 59 | Admitting: Family Medicine

## 2015-08-31 VITALS — BP 130/86 | HR 64 | Temp 98.6°F | Resp 16 | Ht 70.0 in | Wt 242.0 lb

## 2015-08-31 DIAGNOSIS — M109 Gout, unspecified: Secondary | ICD-10-CM

## 2015-08-31 DIAGNOSIS — E668 Other obesity: Secondary | ICD-10-CM | POA: Diagnosis not present

## 2015-08-31 DIAGNOSIS — I10 Essential (primary) hypertension: Secondary | ICD-10-CM

## 2015-08-31 DIAGNOSIS — R7989 Other specified abnormal findings of blood chemistry: Secondary | ICD-10-CM | POA: Diagnosis not present

## 2015-08-31 DIAGNOSIS — E559 Vitamin D deficiency, unspecified: Secondary | ICD-10-CM | POA: Diagnosis not present

## 2015-08-31 DIAGNOSIS — E038 Other specified hypothyroidism: Secondary | ICD-10-CM

## 2015-08-31 DIAGNOSIS — Z13220 Encounter for screening for lipoid disorders: Secondary | ICD-10-CM

## 2015-08-31 DIAGNOSIS — M1000 Idiopathic gout, unspecified site: Secondary | ICD-10-CM

## 2015-08-31 DIAGNOSIS — G4700 Insomnia, unspecified: Secondary | ICD-10-CM | POA: Diagnosis not present

## 2015-08-31 DIAGNOSIS — Z114 Encounter for screening for human immunodeficiency virus [HIV]: Secondary | ICD-10-CM | POA: Diagnosis not present

## 2015-08-31 DIAGNOSIS — E538 Deficiency of other specified B group vitamins: Secondary | ICD-10-CM

## 2015-08-31 DIAGNOSIS — IMO0002 Reserved for concepts with insufficient information to code with codable children: Secondary | ICD-10-CM

## 2015-08-31 MED ORDER — NALTREXONE-BUPROPION HCL ER 8-90 MG PO TB12: 2.0000 | ORAL_TABLET | Freq: Two times a day (BID) | ORAL | Status: DC

## 2015-08-31 MED ORDER — COLCHICINE 0.6 MG PO TABS: 1.2000 mg | ORAL_TABLET | Freq: Every day | ORAL | Status: DC | PRN

## 2015-08-31 MED ORDER — INDOMETHACIN 50 MG PO CAPS: 50.0000 mg | ORAL_CAPSULE | Freq: Three times a day (TID) | ORAL | Status: DC | PRN

## 2015-08-31 NOTE — Progress Notes (Signed)
Name: Oscar Allen   MRN: [id number]    DOB: 08-17-1986   Date:08/31/2015       Progress Note  Subjective  Chief Complaint  Chief Complaint  Patient presents with  . Medication Refill    3 month F/U  . Hypothyroidism    Fatigue, Constipated, Bloated.  . Obesity    Doing well on medication, losted 14 pounds total, weigh has been fluctuating.  . Hip Pain    Right Hip, non traumatic. Onset this am, first episode, took Arginine.    HPI  Hypothyroidism: taking medication as prescribed, he had bronchitis a few weeks ago and is feeling more tired than usual.   Obesity : started on Comtrave at the end of July and is finally on the two pills twice daily, he has been compliant, he is logging his diet on [employer]. He states medication has helped control his appetite and has lost 5 lbs since last visit. Goal is 5% of weight loss in 3 months, so he is on tract to get there.  He is also exercising, going to the gym five days weekly .   Hip Pain: he has history of gout and woke up at 4: 30 am with right hip pain, he took colchicine and is feeling better. He states he ate a burger and drank beer last night.   Fatigue: girlfriend has noticed her falls asleep fast, snores at times . ESS today was 10. He may have sleep apnea, but he wants to try weight loss first and if no improvement we will do a sleep study .   Patient Active Problem List   Diagnosis Date Noted  . Insomnia, persistent 08/26/2015  . Benign hypertension 08/26/2015  . Adult hypothyroidism 08/26/2015  . Low serum cobalamin 08/26/2015  . Adult BMI 30+ 08/26/2015  . Gout of big toe 08/26/2015  . Vitamin D deficiency 08/26/2015    Past Surgical History  Procedure Laterality Date  . Foot surgery Right   . Mandible fracture surgery    . Rhinoplasty    . Eye surgery Bilateral     Family History  Problem Relation Age of Onset  . Anxiety disorder Mother   . Hypothyroidism Father   . Heart Problems Brother      Social History   Social History  . Marital Status: Single    Spouse Name: N/A  . Number of Children: N/A  . Years of Education: N/A   Occupational History  . Not on file.   Social History Main Topics  . Smoking status: Never Smoker   . Smokeless tobacco: Never Used  . Alcohol Use: 0.0 oz/week    0 Standard drinks or equivalent per week     Comment: OCCASIONALLY  . Drug Use: No  . Sexual Activity:    Partners: Female   Other Topics Concern  . Not on file   Social History Narrative     Current outpatient prescriptions:  .  Acetaminophen (TYLENOL PO), Take by mouth as needed., Disp: , Rfl:  .  Ibuprofen (ADVIL PO), Take by mouth as needed., Disp: , Rfl:  .  levothyroxine (SYNTHROID, LEVOTHROID) 75 MCG tablet, Take 75 mcg by mouth daily before breakfast., Disp: , Rfl:  .  Multiple Vitamin tablet, Take by mouth., Disp: , Rfl:  .  Naltrexone-Bupropion HCl ER (CONTRAVE) 8-90 MG TB12, Take by mouth., Disp: , Rfl:  .  [DISCONTINUED] colchicine 0.6 MG tablet, Take 1 tablet (0.6 mg total) by mouth daily.,  Disp: 30 tablet, Rfl: 1  Allergies  Allergen Reactions  . Rhus Coriaria  [[place]]      ROS  Constitutional: Negative for fever positive for  weight change.  Respiratory: Negative for cough and shortness of breath.   Cardiovascular: Negative for chest pain or palpitations.  Gastrointestinal: Negative for abdominal pain, no bowel changes.  Musculoskeletal: Negative for gait problem or joint swelling.  Skin: Negative for rash.  Neurological: Negative for dizziness or headache.  No other specific complaints in a complete review of systems (except as listed in HPI above).  Objective  Filed Vitals:   08/31/15 0945  BP: 130/86  Pulse: 64  Temp: 98.6 F (37 C)  TempSrc: Oral  Resp: 16  Height: 5\' 10"  (1.778 m)  Weight: 242 lb (109.77 kg)  SpO2: 98%    Body mass index is 34.72 kg/(m^2).  Physical Exam  Constitutional: Patient appears well-developed and  well-nourished. Obese  No distress.  HEENT: head atraumatic, normocephalic, pupils equal and reactive to light, neck supple, throat within normal limits Cardiovascular: Normal rate, regular rhythm and normal heart sounds.  No murmur heard. No BLE edema. Pulmonary/Chest: Effort normal and breath sounds normal. No respiratory distress. Abdominal: Soft.  There is no tenderness. Psychiatric: Patient has a normal mood and affect. behavior is normal. Judgment and thought content normal. Muscular Skeletal: pain in right groin with internal and external rotation of right hip.    PHQ2/9: Depression screen PHQ 2/9 08/31/2015  Decreased Interest 0  Down, Depressed, Hopeless 0  PHQ - 2 Score 0    Fall Risk: Fall Risk  08/31/2015  Falls in the past year? No      Assessment & Plan  1. Benign hypertension Doing well with bp , off medication. Life style modification only  - Comprehensive metabolic panel  2. Other specified hypothyroidism  - TSH  3. Insomnia, persistent He falls asleep quickly but wakes up during the night, some snoring. ESS is 1 0 wakes up feeling tired, he will try losing weight and if needed check sleep study in 6 months  4. Low serum cobalamin  - Vitamin B12  5. Vitamin D deficiency  - Vit D  25 hydroxy (rtn osteoporosis monitoring)  6. Adult BMI 30+ Continue Contrave twice daily  Discussed all optiosns for weight loss medications including Belviq, Qsymia, Saxenda and Contrave. Discussed risk and benefits of each of them. - Hemoglobin A1c  7. Gout of big toe  - Uric acid  8. Lipid screening  - Lipid panel  9. Encounter for screening for HIV  - HIV antibody

## 2015-09-01 LAB — TSH: TSH: 2.06 u[IU]/mL (ref 0.450–4.500)

## 2015-09-01 LAB — HEMOGLOBIN A1C
Est. average glucose Bld gHb Est-mCnc: 111 mg/dL
HEMOGLOBIN A1C: 5.5 % (ref 4.8–5.6)

## 2015-09-01 LAB — VITAMIN B12: Vitamin B-12: 597 pg/mL (ref 211–946)

## 2015-09-01 LAB — COMPREHENSIVE METABOLIC PANEL
ALBUMIN: 4.6 g/dL (ref 3.5–5.5)
ALT: 33 IU/L (ref 0–44)
AST: 19 IU/L (ref 0–40)
Albumin/Globulin Ratio: 1.9 (ref 1.1–2.5)
Alkaline Phosphatase: 47 IU/L (ref 39–117)
BUN/Creatinine Ratio: 12 (ref 8–19)
BUN: 13 mg/dL (ref 6–20)
Bilirubin Total: 0.4 mg/dL (ref 0.0–1.2)
CALCIUM: 9.6 mg/dL (ref 8.7–10.2)
CO2: 25 mmol/L (ref 18–29)
CREATININE: 1.05 mg/dL (ref 0.76–1.27)
Chloride: 103 mmol/L (ref 97–108)
GFR calc Af Amer: 110 mL/min/{1.73_m2} (ref >59)
GFR, EST NON AFRICAN AMERICAN: 95 mL/min/{1.73_m2} (ref >59)
GLOBULIN, TOTAL: 2.4 g/dL (ref 1.5–4.5)
Glucose: 95 mg/dL (ref 65–99)
Potassium: 4.9 mmol/L (ref 3.5–5.2)
SODIUM: 143 mmol/L (ref 134–144)
Total Protein: 7 g/dL (ref 6.0–8.5)

## 2015-09-01 LAB — LIPID PANEL
CHOL/HDL RATIO: 4 ratio (ref 0.0–5.0)
Cholesterol, Total: 185 mg/dL (ref 100–199)
HDL: 46 mg/dL (ref >39)
LDL CALC: 116 mg/dL — AB (ref 0–99)
TRIGLYCERIDES: 114 mg/dL (ref 0–149)
VLDL CHOLESTEROL CAL: 23 mg/dL (ref 5–40)

## 2015-09-01 LAB — VITAMIN D 25 HYDROXY (VIT D DEFICIENCY, FRACTURES): Vit D, 25-Hydroxy: 20.8 ng/mL — ABNORMAL LOW (ref 30.0–100.0)

## 2015-09-01 LAB — URIC ACID: Uric Acid: 9.4 mg/dL — ABNORMAL HIGH (ref 3.7–8.6)

## 2015-09-01 LAB — HIV ANTIBODY (ROUTINE TESTING W REFLEX): HIV SCREEN 4TH GENERATION: NONREACTIVE

## 2015-09-04 NOTE — Progress Notes (Signed)
Pt.notified

## 2015-09-11 ENCOUNTER — Other Ambulatory Visit: Payer: Self-pay | Admitting: Family Medicine

## 2015-10-02 ENCOUNTER — Encounter: Payer: Self-pay | Admitting: Family Medicine

## 2015-10-19 ENCOUNTER — Ambulatory Visit: Payer: 59 | Admitting: Family Medicine

## 2015-11-02 ENCOUNTER — Ambulatory Visit (INDEPENDENT_AMBULATORY_CARE_PROVIDER_SITE_OTHER): Payer: 59 | Admitting: Family Medicine

## 2015-11-02 ENCOUNTER — Encounter: Payer: Self-pay | Admitting: Family Medicine

## 2015-11-02 VITALS — BP 130/86 | HR 64 | Temp 98.0°F | Resp 16 | Ht 70.0 in | Wt 247.7 lb

## 2015-11-02 DIAGNOSIS — E668 Other obesity: Secondary | ICD-10-CM | POA: Diagnosis not present

## 2015-11-02 DIAGNOSIS — G4700 Insomnia, unspecified: Secondary | ICD-10-CM

## 2015-11-02 DIAGNOSIS — E790 Hyperuricemia without signs of inflammatory arthritis and tophaceous disease: Secondary | ICD-10-CM

## 2015-11-02 DIAGNOSIS — R7989 Other specified abnormal findings of blood chemistry: Secondary | ICD-10-CM | POA: Diagnosis not present

## 2015-11-02 DIAGNOSIS — R61 Generalized hyperhidrosis: Secondary | ICD-10-CM

## 2015-11-02 DIAGNOSIS — I10 Essential (primary) hypertension: Secondary | ICD-10-CM | POA: Diagnosis not present

## 2015-11-02 DIAGNOSIS — Z23 Encounter for immunization: Secondary | ICD-10-CM | POA: Diagnosis not present

## 2015-11-02 DIAGNOSIS — IMO0002 Reserved for concepts with insufficient information to code with codable children: Secondary | ICD-10-CM

## 2015-11-02 MED ORDER — LORCASERIN HCL 10 MG PO TABS: 1.0000 | ORAL_TABLET | Freq: Two times a day (BID) | ORAL | Status: DC

## 2015-11-02 NOTE — Progress Notes (Signed)
Name: Oscar Allen   MRN: [id number]    DOB: [birth year]   Date:11/02/2015       Progress Note  Subjective  Chief Complaint  Chief Complaint  Patient presents with  . Follow-up    6 weeks  . Hypertension  . Obesity    currently out of contrave has gained 5pds since last visit  . Night Sweats    onset couple years off and on.  Patient wakes up with wet bed    HPI  HTN: on diet only, bp has been at goal, no chest pain or palpitatoin  Insomnia: he states recently he has been falling asleep but wakes up in the middle of the night with the entire mattress being sweaty. He does not recall any dreams. Girlfriend is worried that he may have leukemia. No easy bruising, no increase in fatigue. No weight loss.  Symptoms have been present for the past 5-6 years, he is not worried - intermittent symptoms, but his girlfriend is really worried  Obesity: he tried Contrave, but did not noticed any change in his appetite, out of medication past 2 weeks, and he has gained weight since last visit. He is still going to the gym a few times weekly , trying to eat healthy, but slacked off this past week.   Elevated Uric Acid: explained need to take allopurinol but he does not want to add medication, discussed risk of kidney stones   Patient Active Problem List   Diagnosis Date Noted  . Insomnia, persistent 08/26/2015  . Benign hypertension 08/26/2015  . Adult hypothyroidism 08/26/2015  . Low serum cobalamin 08/26/2015  . Adult BMI 30+ 08/26/2015  . Gout of big toe 08/26/2015  . Vitamin D deficiency 08/26/2015    Past Surgical History  Procedure Laterality Date  . Foot surgery Right   . Mandible fracture surgery    . Rhinoplasty    . Eye surgery Bilateral     Family History  Problem Relation Age of Onset  . Anxiety disorder Mother   . Hypothyroidism Father   . Heart Problems Brother     Social History   Social History  . Marital Status: Single    Spouse Name: N/A  . Number of  Children: N/A  . Years of Education: N/A   Occupational History  . Not on file.   Social History Main Topics  . Smoking status: Never Smoker   . Smokeless tobacco: Never Used  . Alcohol Use: 0.0 oz/week    0 Standard drinks or equivalent per week     Comment: OCCASIONALLY  . Drug Use: No  . Sexual Activity:    Partners: Female   Other Topics Concern  . Not on file   Social History Narrative     Current outpatient prescriptions:  .  colchicine 0.6 MG tablet, Take 2 tablets (1.2 mg total) by mouth daily as needed (and repat in one hour if no resolution of pain)., Disp: 30 tablet, Rfl: 0 .  Ibuprofen (ADVIL PO), Take by mouth as needed., Disp: , Rfl:  .  indomethacin (INDOCIN) 50 MG capsule, Take 1 capsule (50 mg total) by mouth 3 (three) times daily as needed., Disp: 30 capsule, Rfl: 0 .  Lorcaserin HCl (BELVIQ) 10 MG TABS, Take 1 tablet by mouth 2 (two) times daily., Disp: 60 tablet, Rfl: 1 .  Multiple Vitamin tablet, Take by mouth., Disp: , Rfl:  .  SYNTHROID 75 MCG tablet, TAKE 1 TABLET BY MOUTH ONCE DAILY, Disp:  90 tablet, Rfl: 1  Allergies  Allergen Reactions  . Rhus Coriaria  [[place]]      ROS  Constitutional: Negative for fever , positive for weight change.  Respiratory: Negative for cough and shortness of breath.   Cardiovascular: Negative for chest pain or palpitations.  Gastrointestinal: Negative for abdominal pain, no bowel changes.  Musculoskeletal: Negative for gait problem or joint swelling.  Skin: Negative for rash.  Neurological: Negative for dizziness or headache.  No other specific complaints in a complete review of systems (except as listed in HPI above).  Objective  Filed Vitals:   11/02/15 1003  BP: 130/86  Pulse: 64  Temp: 98 F (36.7 C)  TempSrc: Oral  Resp: 16  Height: 5\' 10"  (1.778 m)  Weight: 247 lb 11.2 oz (112.356 kg)  SpO2: 98%    Body mass index is 35.54 kg/(m^2).  Physical Exam  Constitutional: Patient appears  well-developed and well-nourished. Obese No distress.  HEENT: head atraumatic, normocephalic, pupils equal and reactive to light,  neck supple, throat within normal limits Cardiovascular: Normal rate, regular rhythm and normal heart sounds.  No murmur heard. No BLE edema. Pulmonary/Chest: Effort normal and breath sounds normal. No respiratory distress. Abdominal: Soft.  There is no tenderness. Psychiatric: Patient has a normal mood and affect. behavior is normal. Judgment and thought content normal.  Recent Results (from the past 2160 hour(s))  Comprehensive metabolic panel     Status: None   Collection Time: 08/31/15 11:05 AM  Result Value Ref Range   Glucose 95 65 - 99 mg/dL   BUN 13 6 - 20 mg/dL   Creatinine, Ser 1.05 0.76 - 1.27 mg/dL   GFR calc non Af Amer 95 >59 mL/min/1.73   GFR calc Af Amer 110 >59 mL/min/1.73   BUN/Creatinine Ratio 12 8 - 19   Sodium 143 134 - 144 mmol/L   Potassium 4.9 3.5 - 5.2 mmol/L   Chloride 103 97 - 108 mmol/L   CO2 25 18 - 29 mmol/L   Calcium 9.6 8.7 - 10.2 mg/dL   Total Protein 7.0 6.0 - 8.5 g/dL   Albumin 4.6 3.5 - 5.5 g/dL   Globulin, Total 2.4 1.5 - 4.5 g/dL   Albumin/Globulin Ratio 1.9 1.1 - 2.5   Bilirubin Total 0.4 0.0 - 1.2 mg/dL   Alkaline Phosphatase 47 39 - 117 IU/L   AST 19 0 - 40 IU/L   ALT 33 0 - 44 IU/L  Lipid panel     Status: Abnormal   Collection Time: 08/31/15 11:05 AM  Result Value Ref Range   Cholesterol, Total 185 100 - 199 mg/dL   Triglycerides 114 0 - 149 mg/dL   HDL 46 >39 mg/dL    Comment: According to ATP-III Guidelines, HDL-C >59 mg/dL is considered a negative risk factor for CHD.    VLDL Cholesterol Cal 23 5 - 40 mg/dL   LDL Calculated 116 (H) 0 - 99 mg/dL   Chol/HDL Ratio 4.0 0.0 - 5.0 ratio units    Comment:                                   T. Chol/HDL Ratio                                             Men  Women                               1/2 Avg.Risk  3.4    3.3                                    Avg.Risk  5.0    4.4                                2X Avg.Risk  9.6    7.1                                3X Avg.Risk 23.4   11.0   Vitamin B12     Status: None   Collection Time: 08/31/15 11:05 AM  Result Value Ref Range   Vitamin B-12 597 211 - 946 pg/mL  Vit D  25 hydroxy (rtn osteoporosis monitoring)     Status: Abnormal   Collection Time: 08/31/15 11:05 AM  Result Value Ref Range   Vit D, 25-Hydroxy 20.8 (L) 30.0 - 100.0 ng/mL    Comment: Vitamin D deficiency has been defined by the [place] and an Endocrine Society practice guideline as a level of serum 25-OH vitamin D less than 20 ng/mL (1,2). The Endocrine Society went on to further define vitamin D insufficiency as a level between 21 and 29 ng/mL (2). 1. IOM (Institute of Medicine). 2010. Dietary reference    intakes for calcium and D. [place]: The    [employer]. 2. Holick MF, Binkley [place], Bischoff-Ferrari HA, et al.    Evaluation, treatment, and prevention of vitamin D    deficiency: an Endocrine Society clinical practice    guideline. JCEM. 2011 Jul; 96(7):1911-30.   Hemoglobin A1c     Status: None   Collection Time: 08/31/15 11:05 AM  Result Value Ref Range   Hgb A1c MFr Bld 5.5 4.8 - 5.6 %    Comment:          Pre-diabetes: 5.7 - 6.4          Diabetes: >6.4          Glycemic control for adults with diabetes: <7.0    Est. average glucose Bld gHb Est-mCnc 111 mg/dL  Uric acid     Status: Abnormal   Collection Time: 08/31/15 11:05 AM  Result Value Ref Range   Uric Acid 9.4 (H) 3.7 - 8.6 mg/dL    Comment:            Therapeutic target for gout patients: <6.0  TSH     Status: None   Collection Time: 08/31/15 11:05 AM  Result Value Ref Range   TSH 2.060 0.450 - 4.500 uIU/mL  HIV antibody     Status: None   Collection Time: 08/31/15 11:05 AM  Result Value Ref Range   HIV Screen 4th Generation wRfx Non Reactive Non Reactive    PHQ2/9: Depression screen [facility] 2/9 11/02/2015 08/31/2015   Decreased Interest 0 0  Down, Depressed, Hopeless 0 0  PHQ - 2 Score 0 0    Fall Risk: Fall Risk  11/02/2015 08/31/2015  Falls in the past year? No No    Functional Status Survey: Is the patient deaf or have  difficulty hearing?: No Does the patient have difficulty seeing, even when wearing glasses/contacts?: No Does the patient have difficulty concentrating, remembering, or making decisions?: No Does the patient have difficulty walking or climbing stairs?: No Does the patient have difficulty dressing or bathing?: No Does the patient have difficulty doing errands alone such as visiting a doctor's office or shopping?: No    Assessment & Plan  1. Benign hypertension  Doing well on life style modification  2. Adult BMI 30+  - Lorcaserin HCl (BELVIQ) 10 MG TABS; Take 1 tablet by mouth 2 (two) times daily.  Dispense: 60 tablet; Refill: 1  3. Insomnia, persistent  From nigh sweats  4. Night sweats  - CBC with Differential/Platelet  5. Elevated uric acid in blood

## 2015-11-03 LAB — CBC WITH DIFFERENTIAL/PLATELET
BASOS: 0 %
Basophils Absolute: 0 10*3/uL (ref 0.0–0.2)
EOS (ABSOLUTE): 0.2 10*3/uL (ref 0.0–0.4)
EOS: 3 %
HEMATOCRIT: 43.7 % (ref 37.5–51.0)
HEMOGLOBIN: 14.7 g/dL (ref 12.6–17.7)
IMMATURE GRANS (ABS): 0 10*3/uL (ref 0.0–0.1)
IMMATURE GRANULOCYTES: 0 %
LYMPHS: 40 %
Lymphocytes Absolute: 2.5 10*3/uL (ref 0.7–3.1)
MCH: 27.3 pg (ref 26.6–33.0)
MCHC: 33.6 g/dL (ref 31.5–35.7)
MCV: 81 fL (ref 79–97)
MONOCYTES: 9 %
Monocytes Absolute: 0.6 10*3/uL (ref 0.1–0.9)
NEUTROS PCT: 48 %
Neutrophils Absolute: 2.9 10*3/uL (ref 1.4–7.0)
PLATELETS: 264 10*3/uL (ref 150–379)
RBC: 5.39 x10E6/uL (ref 4.14–5.80)
RDW: 14 % (ref 12.3–15.4)
WBC: 6.2 10*3/uL (ref 3.4–10.8)

## 2015-11-21 ENCOUNTER — Encounter: Payer: Self-pay | Admitting: Family Medicine

## 2015-12-14 ENCOUNTER — Ambulatory Visit (INDEPENDENT_AMBULATORY_CARE_PROVIDER_SITE_OTHER): Payer: 59 | Admitting: Family Medicine

## 2015-12-14 ENCOUNTER — Encounter: Payer: Self-pay | Admitting: Family Medicine

## 2015-12-14 VITALS — BP 122/64 | HR 58 | Temp 97.6°F | Resp 16 | Ht 70.0 in | Wt 251.5 lb

## 2015-12-14 DIAGNOSIS — R7989 Other specified abnormal findings of blood chemistry: Secondary | ICD-10-CM

## 2015-12-14 DIAGNOSIS — E668 Other obesity: Secondary | ICD-10-CM | POA: Diagnosis not present

## 2015-12-14 DIAGNOSIS — E538 Deficiency of other specified B group vitamins: Secondary | ICD-10-CM

## 2015-12-14 DIAGNOSIS — I10 Essential (primary) hypertension: Secondary | ICD-10-CM | POA: Diagnosis not present

## 2015-12-14 DIAGNOSIS — IMO0002 Reserved for concepts with insufficient information to code with codable children: Secondary | ICD-10-CM

## 2015-12-14 MED ORDER — B-12 1000 MCG SL SUBL: 1.0000 | SUBLINGUAL_TABLET | Freq: Every day | SUBLINGUAL | Status: DC

## 2015-12-14 MED ORDER — PHENTERMINE-TOPIRAMATE ER 3.75-23 MG PO CP24: 1.0000 | ORAL_CAPSULE | ORAL | Status: DC

## 2015-12-14 MED ORDER — PHENTERMINE-TOPIRAMATE ER 7.5-46 MG PO CP24: 1.0000 | ORAL_CAPSULE | Freq: Every day | ORAL | Status: DC

## 2015-12-14 NOTE — Progress Notes (Signed)
Name: Oscar Allen   MRN: [id number]    DOB: 17-Jul-1986   Date:12/14/2015       Progress Note  Subjective  Chief Complaint  Chief Complaint  Patient presents with  . Hypertension  . Obesity    belviq not working as well as Herbalist.  Patient states working out    HPI  Obesity: he tried Herbalist, but did not noticed any change in his appetite, also tool Belviq for the past 6 weeks and he has gained weight ( three pounds ). He states he is always hungry, usually worse after dinner.  He is still going to the gym a few times weekly and also doing some weight training twice a week at home. Eating a healthy breakfast, salads mostly for lunch and home cooked dinner, but feels hungry and snacks after dinner.   HTN: patient is on life style modification only, denies chest pain or palpitation, more cardiovascular exercises and heart rate and bp have improved  B12 deficiency: not taking supplements, explained the importance of getting SL B12  Patient Active Problem List   Diagnosis Date Noted  . Insomnia, persistent 08/26/2015  . Benign hypertension 08/26/2015  . Adult hypothyroidism 08/26/2015  . Low serum cobalamin 08/26/2015  . Adult BMI 30+ 08/26/2015  . Gout of big toe 08/26/2015  . Vitamin D deficiency 08/26/2015    Past Surgical History  Procedure Laterality Date  . Foot surgery Right   . Mandible fracture surgery    . Rhinoplasty    . Eye surgery Bilateral     Family History  Problem Relation Age of Onset  . Anxiety disorder Mother   . Hypothyroidism Father   . Heart Problems Brother     Social History   Social History  . Marital Status: Single    Spouse Name: N/A  . Number of Children: N/A  . Years of Education: N/A   Occupational History  . Not on file.   Social History Main Topics  . Smoking status: Never Smoker   . Smokeless tobacco: Never Used  . Alcohol Use: 0.0 oz/week    0 Standard drinks or equivalent per week     Comment: OCCASIONALLY  .  Drug Use: No  . Sexual Activity:    Partners: Female   Other Topics Concern  . Not on file   Social History Narrative     Current outpatient prescriptions:  .  colchicine 0.6 MG tablet, Take 2 tablets (1.2 mg total) by mouth daily as needed (and repat in one hour if no resolution of pain)., Disp: 30 tablet, Rfl: 0 .  Cyanocobalamin (B-12) 1000 MCG SUBL, Place 1 tablet under the tongue daily., Disp: 30 each, Rfl: 0 .  Ibuprofen (ADVIL PO), Take by mouth as needed., Disp: , Rfl:  .  indomethacin (INDOCIN) 50 MG capsule, Take 1 capsule (50 mg total) by mouth 3 (three) times daily as needed., Disp: 30 capsule, Rfl: 0 .  Phentermine-Topiramate (QSYMIA) 3.75-23 MG CP24, Take 1 tablet by mouth every morning., Disp: 14 capsule, Rfl: 0 .  Phentermine-Topiramate (QSYMIA) 7.5-46 MG CP24, Take 1 tablet by mouth daily., Disp: 30 capsule, Rfl: 0 .  SYNTHROID 75 MCG tablet, TAKE 1 TABLET BY MOUTH ONCE DAILY, Disp: 90 tablet, Rfl: 1  Allergies  Allergen Reactions  . Rhus Coriaria  [[place]]      ROS  Constitutional: Negative for fever or significant  weight change.  Respiratory: Negative for cough and shortness of breath.   Cardiovascular: Negative  for chest pain or palpitations.  Gastrointestinal: Negative for abdominal pain, no bowel changes.  Musculoskeletal: Negative for gait problem or joint swelling.  Skin: Negative for rash.  Neurological: Negative for dizziness or headache.  No other specific complaints in a complete review of systems (except as listed in HPI above).  Objective  Filed Vitals:   12/14/15 1405  BP: 122/64  Pulse: 58  Temp: 97.6 F (36.4 C)  TempSrc: Oral  Resp: 16  Height: 5\' 10"  (1.778 m)  Weight: 251 lb 8 oz (114.08 kg)  SpO2: 98%    Body mass index is 36.09 kg/(m^2).  Physical Exam  Constitutional: Patient appears well-developed and well-nourished. Obese  No distress.  HEENT: head atraumatic, normocephalic, pupils equal and reactive to light,  neck  supple, throat within normal limits Cardiovascular: Normal rate, regular rhythm and normal heart sounds.  No murmur heard. No BLE edema. Pulmonary/Chest: Effort normal and breath sounds normal. No respiratory distress. Abdominal: Soft.  There is no tenderness. Psychiatric: Patient has a normal mood and affect. behavior is normal. Judgment and thought content normal.  Recent Results (from the past 2160 hour(s))  CBC with Differential/Platelet     Status: None   Collection Time: 11/02/15 10:50 AM  Result Value Ref Range   WBC 6.2 3.4 - 10.8 x10E3/uL   RBC 5.39 4.14 - 5.80 x10E6/uL   Hemoglobin 14.7 12.6 - 17.7 g/dL   Hematocrit 43.7 37.5 - 51.0 %   MCV 81 79 - 97 fL   MCH 27.3 26.6 - 33.0 pg   MCHC 33.6 31.5 - 35.7 g/dL   RDW 14.0 12.3 - 15.4 %   Platelets 264 150 - 379 x10E3/uL   Neutrophils 48 %   Lymphs 40 %   Monocytes 9 %   Eos 3 %   Basos 0 %   Neutrophils Absolute 2.9 1.4 - 7.0 x10E3/uL   Lymphocytes Absolute 2.5 0.7 - 3.1 x10E3/uL   Monocytes Absolute 0.6 0.1 - 0.9 x10E3/uL   EOS (ABSOLUTE) 0.2 0.0 - 0.4 x10E3/uL   Basophils Absolute 0.0 0.0 - 0.2 x10E3/uL   Immature Granulocytes 0 %   Immature Grans (Abs) 0.0 0.0 - 0.1 x10E3/uL    PHQ2/9: Depression screen [facility] 2/9 11/02/2015 08/31/2015  Decreased Interest 0 0  Down, Depressed, Hopeless 0 0  PHQ - 2 Score 0 0    Fall Risk: Fall Risk  11/02/2015 08/31/2015  Falls in the past year? No No    Assessment & Plan  1. Benign hypertension  Doing well without medication  2. Adult BMI 30+  Discussed all optiosns for weight loss medications including Belviq, Qsymia, Saxenda and Contrave. Discussed risk and benefits of each of them. - Phentermine-Topiramate (QSYMIA) 3.75-23 MG CP24; Take 1 tablet by mouth every morning.  Dispense: 14 capsule; Refill: 0 - Phentermine-Topiramate (QSYMIA) 7.5-46 MG CP24; Take 1 tablet by mouth daily.  Dispense: 30 capsule; Refill: 0  3. Low serum cobalamin  - Cyanocobalamin (B-12) 1000 MCG  SUBL; Place 1 tablet under the tongue daily.  Dispense: 30 each; Refill: 0

## 2015-12-18 ENCOUNTER — Encounter: Payer: Self-pay | Admitting: Emergency Medicine

## 2015-12-18 ENCOUNTER — Ambulatory Visit
Admission: EM | Admit: 2015-12-18 | Discharge: 2015-12-18 | Disposition: A | Discharge status: Home / Self Care | Payer: 59 | Attending: Family Medicine | Admitting: Family Medicine

## 2015-12-18 ENCOUNTER — Ambulatory Visit (INDEPENDENT_AMBULATORY_CARE_PROVIDER_SITE_OTHER): Payer: 59

## 2015-12-18 DIAGNOSIS — M659 Synovitis and tenosynovitis, unspecified: Secondary | ICD-10-CM

## 2015-12-18 DIAGNOSIS — M65872 Other synovitis and tenosynovitis, left ankle and foot: Secondary | ICD-10-CM

## 2015-12-18 MED ORDER — NAPROXEN 500 MG PO TABS: 500.0000 mg | ORAL_TABLET | Freq: Two times a day (BID) | ORAL | Status: DC

## 2015-12-18 NOTE — ED Notes (Signed)
Patient c/o left foot and ankle pain that started last night.  Patient denies injury or fall.

## 2015-12-18 NOTE — ED Provider Notes (Signed)
CSN: [id number]     Arrival date & time 12/18/15  1131 History   First MD Initiated Contact with Patient 12/18/15 1216     Chief Complaint  Patient presents with  . Ankle Pain  . Foot Pain   (Consider location/radiation/quality/duration/timing/severity/associated sxs/prior Treatment) HPI   This a 29 year old male who presents today with the sudden onset of left foot and ankle pain began yesterday and worsened last night. He states he had no injury to his ankle that he remembers. There is been no increase in his physical activity. Works as a Community education officer at [employer] and has been performing some cardio exercises but nothing excessive or any change. He does have a history of gout in the past as well. He states that 3:00 this morning the pain was more severe he was unable to comfortable. He took an indomethacin ( unknown strength) Tylenol and it has  improved somewhat. Most pain is over anterior ankle. He also feels it near the tarsal navicular. He states with movement of his ankle he feels a definite clunk. He is limping somewhat.  Past Medical History  Diagnosis Date  . Thyroid disorder   . Gout    Past Surgical History  Procedure Laterality Date  . Foot surgery Right   . Mandible fracture surgery    . Rhinoplasty    . Eye surgery Bilateral    Family History  Problem Relation Age of Onset  . Anxiety disorder Mother   . Hypothyroidism Father   . Heart Problems Brother    Social History  Substance Use Topics  . Smoking status: Never Smoker   . Smokeless tobacco: Never Used  . Alcohol Use: 0.0 oz/week    0 Standard drinks or equivalent per week     Comment: OCCASIONALLY    Review of Systems  Constitutional: Positive for activity change. Negative for fever, chills, diaphoresis, fatigue and unexpected weight change.  Musculoskeletal: Positive for arthralgias and gait problem.  All other systems reviewed and are negative.   Allergies  Rhus coriaria   Home  Medications   Prior to Admission medications   Medication Sig Start Date End Date Taking? Authorizing Provider  colchicine 0.6 MG tablet Take 2 tablets (1.2 mg total) by mouth daily as needed (and repat in one hour if no resolution of pain). 08/31/15   Steele Sizer, MD  Cyanocobalamin (B-12) 1000 MCG SUBL Place 1 tablet under the tongue daily. 12/14/15   Steele Sizer, MD  Ibuprofen (ADVIL PO) Take by mouth as needed.    Historical Provider, MD  indomethacin (INDOCIN) 50 MG capsule Take 1 capsule (50 mg total) by mouth 3 (three) times daily as needed. 08/31/15   Steele Sizer, MD  naproxen (NAPROSYN) 500 MG tablet Take 1 tablet (500 mg total) by mouth 2 (two) times daily with a meal. 12/18/15   Lorin Picket, PA-C  Phentermine-Topiramate (QSYMIA) 3.75-23 MG CP24 Take 1 tablet by mouth every morning. 12/14/15   Steele Sizer, MD  Phentermine-Topiramate (QSYMIA) 7.5-46 MG CP24 Take 1 tablet by mouth daily. 12/14/15   Steele Sizer, MD  SYNTHROID 75 MCG tablet TAKE 1 TABLET BY MOUTH ONCE DAILY 09/11/15   Steele Sizer, MD   Meds Ordered and Administered this Visit  Medications - No data to display  BP 144/89 mmHg  Pulse 51  Temp(Src) 96.5 F (35.8 C) (Tympanic)  Resp 16  Ht 5\' 10"  (1.778 m)  Wt 248 lb (112.492 kg)  BMI 35.58 kg/m2  SpO2 99%  No data found.   Physical Exam  Constitutional: He is oriented to person, place, and time. He appears well-developed and well-nourished. No distress.  HENT:  Head: Normocephalic and atraumatic.  Eyes: Pupils are equal, round, and reactive to light.  Musculoskeletal: He exhibits edema and tenderness.  Exam nation of the left ankle in comparison to the right shows a near normal range of motion bilaterally. Talar motion is also intact but complains of increased ankle pain with inversion with more pain laterally with that motion. There appears to be some bogginess to the anterior ankle joint in comparison to the right. There is no warmth or  significant tenderness.  Neurological: He is alert and oriented to person, place, and time.  Skin: Skin is warm and dry. No rash noted. He is not diaphoretic. No erythema.  Psychiatric: He has a normal mood and affect. His behavior is normal. Judgment and thought content normal.  Nursing note and vitals reviewed.   ED Course  Procedures (including critical care time)  Labs Review Labs Reviewed - No data to display  Imaging Review Dg Ankle Complete Left  12/18/2015  CLINICAL DATA:  29 year old male with left ankle pain since 0300 hours. No known injury. Initial encounter. EXAM: LEFT ANKLE COMPLETE - 3+ VIEW COMPARISON:  12/09/2012. FINDINGS: Mortise joint alignment is stable and normal. Ankle joint effusion seen in 2013 has resolved. Talar dome intact. Unchanged small lucent area in the lateral malleolus since 2013, favor benign and inconsequential otherwise Bone mineralization is within normal limits. Mild degenerative spurring at the lateral malleolus and lateral talus has increased since that time. Calcaneus intact. No acute osseous abnormality identified. IMPRESSION: Mild progression of lateral ankle degenerative spurring since 2013. No acute osseous abnormality identified. Electronically Signed   By: Genevie Ann M.D.   On: 12/18/2015 12:48     Visual Acuity Review  Right Eye Distance:   Left Eye Distance:   Bilateral Distance:    Right Eye Near:   Left Eye Near:    Bilateral Near:         MDM   1. Synovitis of left ankle    Discharge Medication List as of 12/18/2015  1:09 PM    START taking these medications   Details  naproxen (NAPROSYN) 500 MG tablet Take 1 tablet (500 mg total) by mouth 2 (two) times daily with a meal., Starting 12/18/2015, Until Discontinued, Normal      Plan: 1. Test/x-ray results and diagnosis reviewed with patient 2. rx as per orders; risks, benefits, potential side effects reviewed with patient 3. Recommend supportive treatment with symptom  avoidance and rest as necessary. May use a boot when necessary. Patient states that he has one at home from previous injury. Avoid aggravating his ankle. 4. F/u  with his PCP if it worsens.     Lorin Picket, PA-C 12/18/15 2056

## 2016-01-25 ENCOUNTER — Encounter: Payer: Self-pay | Admitting: Family Medicine

## 2016-01-25 ENCOUNTER — Ambulatory Visit (INDEPENDENT_AMBULATORY_CARE_PROVIDER_SITE_OTHER): Payer: 59 | Admitting: Family Medicine

## 2016-01-25 ENCOUNTER — Other Ambulatory Visit: Payer: Self-pay | Admitting: Family Medicine

## 2016-01-25 VITALS — BP 130/82 | HR 73 | Temp 98.7°F | Resp 16 | Ht 70.0 in | Wt 245.7 lb

## 2016-01-25 DIAGNOSIS — I10 Essential (primary) hypertension: Secondary | ICD-10-CM

## 2016-01-25 DIAGNOSIS — M25572 Pain in left ankle and joints of left foot: Secondary | ICD-10-CM | POA: Diagnosis not present

## 2016-01-25 DIAGNOSIS — G8929 Other chronic pain: Secondary | ICD-10-CM

## 2016-01-25 DIAGNOSIS — IMO0002 Reserved for concepts with insufficient information to code with codable children: Secondary | ICD-10-CM

## 2016-01-25 DIAGNOSIS — E668 Other obesity: Secondary | ICD-10-CM | POA: Diagnosis not present

## 2016-01-25 MED ORDER — PHENTERMINE-TOPIRAMATE ER 7.5-46 MG PO CP24: 1.0000 | ORAL_CAPSULE | Freq: Every day | ORAL | Status: DC

## 2016-01-25 MED ORDER — TRAMADOL HCL 50 MG PO TABS: 50.0000 mg | ORAL_TABLET | Freq: Four times a day (QID) | ORAL | Status: DC

## 2016-01-25 NOTE — Progress Notes (Signed)
Name: Oscar Allen   MRN: [id number]    DOB: 07/14/1986   Date:01/25/2016       Progress Note  Subjective  Chief Complaint  Chief Complaint  Patient presents with  . Hypertension    lower dose more headache than with the higher dose of medication. monitors it regularly.  . Ankle Injury    patient stated that he has intermittent pain in his left ankle, has been seen at the urgent care    HPI  HTN: he has been drinking more water, going to the gym 4-5 days weekly and incorporating more cardio. His bp has been under control without medication  Ankle Injury: he has a long history of left lateral ankle pain, seen by Podiatrist years ago - [place], per patient x-ray was normal, but they suspect a peroneal tendon tear and will need a MRI - but he can't afford it. Recently seen by Urgent Care with a flare of pain, and was given Naproxen, but the pain affected his ability to work for a couple days. Pain at this time is not bad, only mild with plantar flexion and left lateral movement of foot.   Obesity: he tried Contrave, but did not noticed any change in his appetite, also tool Belviq for the past 6 weeks and he gained weight.  He is still going to the gym about 5 times per weekly. He states since he started on Qsymia 11/2016 he has noticed improvement of his appetite, not feeling hungry all the time.   Patient Active Problem List   Diagnosis Date Noted  . Insomnia, persistent 08/26/2015  . Benign hypertension 08/26/2015  . Adult hypothyroidism 08/26/2015  . Low serum cobalamin 08/26/2015  . Adult BMI 30+ 08/26/2015  . Gout of big toe 08/26/2015  . Vitamin D deficiency 08/26/2015    Past Surgical History  Procedure Laterality Date  . Foot surgery Right   . Mandible fracture surgery    . Rhinoplasty    . Eye surgery Bilateral     Family History  Problem Relation Age of Onset  . Anxiety disorder Mother   . Hypothyroidism Father   . Heart Problems Brother      Social History   Social History  . Marital Status: Single    Spouse Name: N/A  . Number of Children: N/A  . Years of Education: N/A   Occupational History  . Not on file.   Social History Main Topics  . Smoking status: Never Smoker   . Smokeless tobacco: Never Used  . Alcohol Use: 0.0 oz/week    0 Standard drinks or equivalent per week     Comment: OCCASIONALLY  . Drug Use: No  . Sexual Activity:    Partners: Female   Other Topics Concern  . Not on file   Social History Narrative     Current outpatient prescriptions:  .  naproxen (NAPROSYN) 500 MG tablet, Take 1 tablet (500 mg total) by mouth 2 (two) times daily with a meal., Disp: 60 tablet, Rfl: 0 .  Phentermine-Topiramate (QSYMIA) 7.5-46 MG CP24, Take 1 tablet by mouth daily., Disp: 30 capsule, Rfl: 1 .  SYNTHROID 75 MCG tablet, TAKE 1 TABLET BY MOUTH ONCE DAILY, Disp: 90 tablet, Rfl: 1 .  colchicine 0.6 MG tablet, Take 2 tablets (1.2 mg total) by mouth daily as needed (and repat in one hour if no resolution of pain). (Patient not taking: Reported on 01/25/2016), Disp: 30 tablet, Rfl: 0 .  Cyanocobalamin (B-12)  1000 MCG SUBL, Place 1 tablet under the tongue daily. (Patient not taking: Reported on 01/25/2016), Disp: 30 each, Rfl: 0 .  Ibuprofen (ADVIL PO), Take by mouth as needed. Reported on 01/25/2016, Disp: , Rfl:  .  indomethacin (INDOCIN) 50 MG capsule, Take 1 capsule (50 mg total) by mouth 3 (three) times daily as needed. (Patient not taking: Reported on 01/25/2016), Disp: 30 capsule, Rfl: 0 .  traMADol (ULTRAM) 50 MG tablet, Take 1 tablet (50 mg total) by mouth 4 (four) times daily., Disp: 40 tablet, Rfl: 0  Allergies  Allergen Reactions  . Rhus Coriaria  [[place]]      ROS  Constitutional: Negative for fever or significant weight change.  Respiratory: Negative for cough and shortness of breath.   Cardiovascular: Negative for chest pain or palpitations.  Gastrointestinal: Negative for abdominal pain, no bowel  changes.  Musculoskeletal: Negative for gait problem or joint swelling.  Skin: Negative for rash.  Neurological: Negative for dizziness or headache.  No other specific complaints in a complete review of systems (except as listed in HPI above).  Objective  Filed Vitals:   01/25/16 1450  BP: 130/82  Pulse: 73  Temp: 98.7 F (37.1 C)  TempSrc: Oral  Resp: 16  Height: 5\' 10"  (1.778 m)  Weight: 245 lb 11.2 oz (111.449 kg)  SpO2: 98%    Body mass index is 35.25 kg/(m^2).  Physical Exam  Constitutional: Patient appears well-developed and well-nourished. Obese  No distress.  HEENT: head atraumatic, normocephalic, pupils equal and reactive to light,  neck supple, throat within normal limits Cardiovascular: Normal rate, regular rhythm and normal heart sounds.  No murmur heard. No BLE edema. Pulmonary/Chest: Effort normal and breath sounds normal. No respiratory distress. Abdominal: Soft.  There is no tenderness. Psychiatric: Patient has a normal mood and affect. behavior is normal. Judgment and thought content normal. Muscular Skeletal: normal rom of left foot behind left lateral malleolus, no effusion or redness.   Recent Results (from the past 2160 hour(s))  CBC with Differential/Platelet     Status: None   Collection Time: 11/02/15 10:50 AM  Result Value Ref Range   WBC 6.2 3.4 - 10.8 x10E3/uL   RBC 5.39 4.14 - 5.80 x10E6/uL   Hemoglobin 14.7 12.6 - 17.7 g/dL   Hematocrit 43.7 37.5 - 51.0 %   MCV 81 79 - 97 fL   MCH 27.3 26.6 - 33.0 pg   MCHC 33.6 31.5 - 35.7 g/dL   RDW 14.0 12.3 - 15.4 %   Platelets 264 150 - 379 x10E3/uL   Neutrophils 48 %   Lymphs 40 %   Monocytes 9 %   Eos 3 %   Basos 0 %   Neutrophils Absolute 2.9 1.4 - 7.0 x10E3/uL   Lymphocytes Absolute 2.5 0.7 - 3.1 x10E3/uL   Monocytes Absolute 0.6 0.1 - 0.9 x10E3/uL   EOS (ABSOLUTE) 0.2 0.0 - 0.4 x10E3/uL   Basophils Absolute 0.0 0.0 - 0.2 x10E3/uL   Immature Granulocytes 0 %   Immature Grans (Abs) 0.0 0.0  - 0.1 x10E3/uL     PHQ2/9: Depression screen [facility] 2/9 01/25/2016 11/02/2015 08/31/2015  Decreased Interest 0 0 0  Down, Depressed, Hopeless 0 0 0  PHQ - 2 Score 0 0 0    Fall Risk: Fall Risk  01/25/2016 11/02/2015 08/31/2015  Falls in the past year? No No No     Functional Status Survey: Is the patient deaf or have difficulty hearing?: No Does the patient have difficulty seeing,  even when wearing glasses/contacts?: No Does the patient have difficulty concentrating, remembering, or making decisions?: No Does the patient have difficulty walking or climbing stairs?: Yes (only when his left ankle flares up) Does the patient have difficulty dressing or bathing?: No Does the patient have difficulty doing errands alone such as visiting a doctor's office or shopping?: No    Assessment & Plan  1. Chronic pain of left ankle  - traMADol (ULTRAM) 50 MG tablet; Take 1 tablet (50 mg total) by mouth 4 (four) times daily.  Dispense: 40 tablet; Refill: 0  2. Adult BMI 30+  - Phentermine-Topiramate (QSYMIA) 7.5-46 MG CP24; Take 1 tablet by mouth daily.  Dispense: 30 capsule; Refill: 1  3. Benign hypertension  Doing well, continue to monitor, avoid nsaid's

## 2016-02-29 ENCOUNTER — Other Ambulatory Visit: Payer: Self-pay | Admitting: Family Medicine

## 2016-03-28 ENCOUNTER — Ambulatory Visit: Payer: 59 | Admitting: Family Medicine

## 2016-03-28 ENCOUNTER — Encounter: Payer: Self-pay | Admitting: Family Medicine

## 2016-03-28 ENCOUNTER — Ambulatory Visit (INDEPENDENT_AMBULATORY_CARE_PROVIDER_SITE_OTHER): Payer: 59 | Admitting: Family Medicine

## 2016-03-28 VITALS — BP 122/76 | HR 56 | Temp 98.3°F | Resp 12 | Wt 230.3 lb

## 2016-03-28 DIAGNOSIS — IMO0002 Reserved for concepts with insufficient information to code with codable children: Secondary | ICD-10-CM

## 2016-03-28 DIAGNOSIS — Z8679 Personal history of other diseases of the circulatory system: Secondary | ICD-10-CM | POA: Diagnosis not present

## 2016-03-28 DIAGNOSIS — M779 Enthesopathy, unspecified: Secondary | ICD-10-CM

## 2016-03-28 DIAGNOSIS — M7752 Other enthesopathy of left foot: Secondary | ICD-10-CM | POA: Insufficient documentation

## 2016-03-28 DIAGNOSIS — E668 Other obesity: Secondary | ICD-10-CM

## 2016-03-28 DIAGNOSIS — E038 Other specified hypothyroidism: Secondary | ICD-10-CM | POA: Diagnosis not present

## 2016-03-28 DIAGNOSIS — M25572 Pain in left ankle and joints of left foot: Secondary | ICD-10-CM | POA: Diagnosis not present

## 2016-03-28 MED ORDER — PHENTERMINE-TOPIRAMATE ER 7.5-46 MG PO CP24: 1.0000 | ORAL_CAPSULE | Freq: Every day | ORAL | Status: DC

## 2016-03-28 NOTE — Progress Notes (Signed)
Name: Oscar Allen   MRN: [id number]    DOB: 1986/09/24   Date:03/28/2016       Progress Note  Subjective  Chief Complaint  Chief Complaint  Patient presents with  . Follow-up  . Ankle Pain    patient stated that he has had a flare up, nonresponsive to tramadol and ice    HPI  Ankle pain: he states that symptoms started three years ago, without injury. Seen by [place] and was diagnosed with possible peroneal tendon split and he was given a couple steroid injections and symptoms improved. Since that time he has been having intermittent pain, but he went running for a little over one mile and that evening he developed a dull pain on the left lateral malleolus, and has been aching since, he states symptoms are worse after activity and he has been taking Tramadol at night to control symptoms. He had recent x-ray done at Urgent Care and showed some spurring on left ankle.   Obesity: he tried Contrave, but did not noticed any change in his appetite, also tool Belviq for the past 6 weeks and he gained weight.  He is still going to the gym about 3  times per weekly. He states since he started on Qsymia 11/2016  At weight of 251 lbs he has noticed improvement of his appetite, not feeling hungry all the time. He has lost 21 lbs since and has achieve over 5 % of weight loss. He is tracking his calorie intake.  Hypothyroidism: taking medication and denies side effects of medication.   Patient Active Problem List   Diagnosis Date Noted  . Bone spur of left ankle 03/28/2016  . Insomnia, persistent 08/26/2015  . History of hypertension 08/26/2015  . Adult hypothyroidism 08/26/2015  . Low serum cobalamin 08/26/2015  . Adult BMI 30+ 08/26/2015  . Gout of big toe 08/26/2015  . Vitamin D deficiency 08/26/2015    Past Surgical History  Procedure Laterality Date  . Foot surgery Right   . Mandible fracture surgery    . Rhinoplasty    . Eye surgery Bilateral     Family History   Problem Relation Age of Onset  . Anxiety disorder Mother   . Hypothyroidism Father   . Heart Problems Brother     Social History   Social History  . Marital Status: Single    Spouse Name: N/A  . Number of Children: N/A  . Years of Education: N/A   Occupational History  . Not on file.   Social History Main Topics  . Smoking status: Never Smoker   . Smokeless tobacco: Never Used  . Alcohol Use: 0.0 oz/week    0 Standard drinks or equivalent per week     Comment: OCCASIONALLY  . Drug Use: No  . Sexual Activity:    Partners: Female   Other Topics Concern  . Not on file   Social History Narrative     Current outpatient prescriptions:  .  colchicine 0.6 MG tablet, Take 2 tablets (1.2 mg total) by mouth daily as needed (and repat in one hour if no resolution of pain). (Patient not taking: Reported on 01/25/2016), Disp: 30 tablet, Rfl: 0 .  Cyanocobalamin (B-12) 1000 MCG SUBL, Place 1 tablet under the tongue daily. (Patient not taking: Reported on 01/25/2016), Disp: 30 each, Rfl: 0 .  Ibuprofen (ADVIL PO), Take by mouth as needed. Reported on 01/25/2016, Disp: , Rfl:  .  indomethacin (INDOCIN) 50 MG capsule, Take  1 capsule (50 mg total) by mouth 3 (three) times daily as needed. (Patient not taking: Reported on 01/25/2016), Disp: 30 capsule, Rfl: 0 .  naproxen (NAPROSYN) 500 MG tablet, Take 1 tablet (500 mg total) by mouth 2 (two) times daily with a meal., Disp: 60 tablet, Rfl: 0 .  Phentermine-Topiramate (QSYMIA) 7.5-46 MG CP24, Take 1 tablet by mouth daily., Disp: 30 capsule, Rfl: 1 .  SYNTHROID 75 MCG tablet, TAKE 1 TABLET BY MOUTH ONCE DAILY, Disp: 90 tablet, Rfl: 0 .  traMADol (ULTRAM) 50 MG tablet, Take 1 tablet (50 mg total) by mouth 4 (four) times daily., Disp: 40 tablet, Rfl: 0  Allergies  Allergen Reactions  . Rhus Coriaria  [[place]]      ROS  Ten systems reviewed and is negative except as mentioned in HPI   Objective  Filed Vitals:   03/28/16 1402  BP: 122/76   Pulse: 56  Temp: 98.3 F (36.8 C)  TempSrc: Oral  Resp: 12  Weight: 230 lb 4.8 oz (104.463 kg)  SpO2: 98%    Body mass index is 33.04 kg/(m^2).  Physical Exam  Constitutional: Patient appears well-developed and well-nourished. Obese  No distress.  HEENT: head atraumatic, normocephalic, pupils equal and reactive to light,  neck supple, throat within normal limits Cardiovascular: Normal rate, regular rhythm and normal heart sounds.  No murmur heard. No BLE edema. Pulmonary/Chest: Effort normal and breath sounds normal. No respiratory distress. Abdominal: Soft.  There is no tenderness. Psychiatric: Patient has a normal mood and affect. behavior is normal. Judgment and thought content normal. Muscular Skeletal: pain with dorsiflexion of left ankle, some pain during palpation of left malleolus, no swelling or redness.   PHQ2/9: Depression screen [facility] 2/9 03/28/2016 01/25/2016 11/02/2015 08/31/2015  Decreased Interest 0 0 0 0  Down, Depressed, Hopeless 0 0 0 0  PHQ - 2 Score 0 0 0 0    Fall Risk: Fall Risk  03/28/2016 01/25/2016 11/02/2015 08/31/2015  Falls in the past year? No No No No    Functional Status Survey: Is the patient deaf or have difficulty hearing?: No Does the patient have difficulty seeing, even when wearing glasses/contacts?: No Does the patient have difficulty concentrating, remembering, or making decisions?: No Does the patient have difficulty walking or climbing stairs?: No Does the patient have difficulty dressing or bathing?: No Does the patient have difficulty doing errands alone such as visiting a doctor's office or shopping?: No    Assessment & Plan  1. Left ankle pain  He will try Naproxen for 5-7 days and if no improvement he will call back for referral to Podiatrist ( seen at [facility] in the past )  2. Adult BMI 30+  Doing great with weight loss, changed his diet, logging calories, exercising on a regular basis and is feeling good -  Phentermine-Topiramate (QSYMIA) 7.5-46 MG CP24; Take 1 tablet by mouth daily.  Dispense: 30 capsule; Refill: 2  3. Bone spur of left ankle  On x-ray done at Urgent Care  4. Other specified hypothyroidism  Continue levothyroxine  5. History of hypertension  He has lost weight, bp is great, no longer has hypertension, mild bradycardia likely from cardiovascular fitness, since he has lost so much weight and is asymptomatic.

## 2016-04-07 ENCOUNTER — Other Ambulatory Visit: Payer: Self-pay | Admitting: Family Medicine

## 2016-04-07 NOTE — Telephone Encounter (Signed)
Patient requesting refill. 

## 2016-06-27 ENCOUNTER — Other Ambulatory Visit: Payer: Self-pay | Admitting: Family Medicine

## 2016-06-27 NOTE — Telephone Encounter (Signed)
Patient requesting refill. 

## 2016-07-07 ENCOUNTER — Ambulatory Visit: Payer: 59 | Admitting: Family Medicine

## 2016-07-11 ENCOUNTER — Encounter: Payer: Self-pay | Admitting: Family Medicine

## 2016-07-11 ENCOUNTER — Ambulatory Visit (INDEPENDENT_AMBULATORY_CARE_PROVIDER_SITE_OTHER): Payer: 59 | Admitting: Family Medicine

## 2016-07-11 VITALS — BP 128/72 | HR 83 | Temp 98.0°F | Resp 16 | Ht 70.0 in | Wt 218.3 lb

## 2016-07-11 DIAGNOSIS — G4700 Insomnia, unspecified: Secondary | ICD-10-CM | POA: Diagnosis not present

## 2016-07-11 DIAGNOSIS — R7989 Other specified abnormal findings of blood chemistry: Secondary | ICD-10-CM | POA: Diagnosis not present

## 2016-07-11 DIAGNOSIS — IMO0002 Reserved for concepts with insufficient information to code with codable children: Secondary | ICD-10-CM

## 2016-07-11 DIAGNOSIS — E038 Other specified hypothyroidism: Secondary | ICD-10-CM

## 2016-07-11 DIAGNOSIS — E668 Other obesity: Secondary | ICD-10-CM

## 2016-07-11 DIAGNOSIS — M779 Enthesopathy, unspecified: Secondary | ICD-10-CM

## 2016-07-11 DIAGNOSIS — E538 Deficiency of other specified B group vitamins: Secondary | ICD-10-CM

## 2016-07-11 DIAGNOSIS — E559 Vitamin D deficiency, unspecified: Secondary | ICD-10-CM | POA: Diagnosis not present

## 2016-07-11 DIAGNOSIS — M7752 Other enthesopathy of left foot: Secondary | ICD-10-CM

## 2016-07-11 LAB — TSH: TSH: 2.41 m[IU]/L (ref 0.40–4.50)

## 2016-07-11 LAB — VITAMIN B12: Vitamin B-12: 337 pg/mL (ref 200–1100)

## 2016-07-11 MED ORDER — TRAMADOL HCL 50 MG PO TABS: 50.0000 mg | ORAL_TABLET | Freq: Four times a day (QID) | ORAL | Status: DC

## 2016-07-11 MED ORDER — PHENTERMINE-TOPIRAMATE ER 11.25-69 MG PO CP24: 1.0000 | ORAL_CAPSULE | ORAL | Status: DC

## 2016-07-11 NOTE — Progress Notes (Signed)
Name: Oscar Allen   MRN: [id number]    DOB: 01-Dec-1986   Date:07/11/2016       Progress Note  Subjective  Chief Complaint  Chief Complaint  Patient presents with  . Follow-up    patient is here for his 3 month f/u  . Hypothyroidism    Patient has been fatigue but started Vitamin D a month ago. Patient has been stuck at a plateau even with working out though it could be due to thyroid    . Obesity    Doing well with medication and working    HPI  Ankle pain: he states that symptoms started three years ago, without injury. Seen by [facility] and was diagnosed with possible peroneal tendon split and he was given a couple steroid injections and symptoms improved. Since that time he has been having intermittent pain. It was getting aggravated by running, but he was able to run a 5 k about one month ago. He states symptoms are worse after activity and he has been taking Tramadol at night to control symptoms, but only intermittently. He had recent x-ray done at Urgent Care and showed some spurring on left ankle.   Obesity: he tried Contrave, but did not noticed any change in his appetite, also tool Belviq for the past 6 weeks and he gained weight.  He is still going to the gym about 3 times per weekly. He states since he started on Qsymia 11/2016, initial weight of 251 lbs and is down to 218.5 lbs , total loss of 32.5 lbs. He is doing very well, however over the past two month he has reached a plateu of around 220lbs. The medication decreases  his appetite, not feeling hungry all the time.  He is tracking his calorie intake.  Hypothyroidism: taking medication and denies side effects of medication. He has noticed increase in fatigue lately, still taking vitamin D but not taking B12   Gout: no recent episodes of gout, his uric acid was very high in the past but did not want to take medication  Insomnia: he is a light sleeper and wakes up during the night during the night, 5 nights  a week, but able to fall asleep, he has some snoring, no pauses during sleep, no headache when he wakes up. He wants to hold off on sleep study.    Patient Active Problem List   Diagnosis Date Noted  . Bone spur of left ankle 03/28/2016  . Insomnia, persistent 08/26/2015  . History of hypertension 08/26/2015  . Adult hypothyroidism 08/26/2015  . Low serum cobalamin 08/26/2015  . Adult BMI 30+ 08/26/2015  . Gout of big toe 08/26/2015  . Vitamin D deficiency 08/26/2015    Past Surgical History  Procedure Laterality Date  . Foot surgery Right   . Mandible fracture surgery    . Rhinoplasty    . Eye surgery Bilateral     Family History  Problem Relation Age of Onset  . Anxiety disorder Mother   . Hypothyroidism Father   . Heart Problems Brother     Social History   Social History  . Marital Status: Single    Spouse Name: N/A  . Number of Children: N/A  . Years of Education: N/A   Occupational History  . Not on file.   Social History Main Topics  . Smoking status: Never Smoker   . Smokeless tobacco: Never Used  . Alcohol Use: 0.0 oz/week    0 Standard drinks or  equivalent per week     Comment: OCCASIONALLY  . Drug Use: No  . Sexual Activity:    Partners: Female   Other Topics Concern  . Not on file   Social History Narrative     Current outpatient prescriptions:  .  Cholecalciferol (VITAMIN D) 2000 units CAPS, Take 1 capsule by mouth daily., Disp: , Rfl:  .  naproxen (NAPROSYN) 500 MG tablet, Take 1 tablet (500 mg total) by mouth 2 (two) times daily with a meal., Disp: 60 tablet, Rfl: 0 .  SYNTHROID 75 MCG tablet, TAKE 1 TABLET BY MOUTH ONCE DAILY, Disp: 90 tablet, Rfl: 0 .  traMADol (ULTRAM) 50 MG tablet, Take 1 tablet (50 mg total) by mouth 4 (four) times daily., Disp: 40 tablet, Rfl: 0 .  colchicine 0.6 MG tablet, Take 2 tablets (1.2 mg total) by mouth daily as needed (and repat in one hour if no resolution of pain). (Patient not taking: Reported on  01/25/2016), Disp: 30 tablet, Rfl: 0 .  Cyanocobalamin (B-12) 1000 MCG SUBL, Place 1 tablet under the tongue daily. (Patient not taking: Reported on 01/25/2016), Disp: 30 each, Rfl: 0 .  Phentermine-Topiramate (QSYMIA) 11.25-69 MG CP24, Take 1 tablet by mouth 30 (thirty) minutes before procedure., Disp: 30 capsule, Rfl: 2  Allergies  Allergen Reactions  . Rhus Coriaria  [Sumac]      ROS  Constitutional: Negative for fever, positive for  weight change.  Respiratory: Negative for cough and shortness of breath.   Cardiovascular: Negative for chest pain or palpitations.  Gastrointestinal: Negative for abdominal pain, no bowel changes.  Musculoskeletal: Negative for gait problem or joint swelling.  Skin: Negative for rash.  Neurological: Negative for dizziness or headache.  No other specific complaints in a complete review of systems (except as listed in HPI above).  Objective  Filed Vitals:   07/11/16 1447  BP: 128/72  Pulse: 83  Temp: 98 F (36.7 C)  TempSrc: Oral  Resp: 16  Height: 5\' 10"  (1.778 m)  Weight: 218 lb 4.8 oz (99.02 kg)  SpO2: 98%    Body mass index is 31.32 kg/(m^2).  Physical Exam  Constitutional: Patient appears well-developed and well-nourished. Obese  No distress.  HEENT: head atraumatic, normocephalic, pupils equal and reactive to light,  neck supple, throat within normal limits, no thyromegaly  Cardiovascular: Normal rate, regular rhythm and normal heart sounds.  No murmur heard. No BLE edema. Pulmonary/Chest: Effort normal and breath sounds normal. No respiratory distress. Abdominal: Soft.  There is no tenderness. Psychiatric: Patient has a normal mood and affect. behavior is normal. Judgment and thought content normal.  PHQ2/9: Depression screen [facility] 2/9 07/11/2016 03/28/2016 01/25/2016 11/02/2015 08/31/2015  Decreased Interest 0 0 0 0 0  Down, Depressed, Hopeless 0 0 0 0 0  PHQ - 2 Score 0 0 0 0 0    Fall Risk: Fall Risk  07/11/2016 03/28/2016 01/25/2016  11/02/2015 08/31/2015  Falls in the past year? No No No No No    Functional Status Survey: Is the patient deaf or have difficulty hearing?: No Does the patient have difficulty seeing, even when wearing glasses/contacts?: No Does the patient have difficulty concentrating, remembering, or making decisions?: No Does the patient have difficulty walking or climbing stairs?: No Does the patient have difficulty dressing or bathing?: No Does the patient have difficulty doing errands alone such as visiting a doctor's office or shopping?: No    Assessment & Plan  1. Other specified hypothyroidism  - TSH  2.  Adult BMI 30+  We will increase dose of Qysmia and monitor, continue diet and exercise - Phentermine-Topiramate (QSYMIA) 11.25-69 MG CP24; Take 1 tablet by mouth 30 (thirty) minutes before procedure.  Dispense: 30 capsule; Refill: 2  3. Vitamin D deficiency  - VITAMIN D 25 Hydroxy (Vit-D Deficiency, Fractures)  4. Insomnia, persistent  Discussed sleep hygiene  5. Low serum cobalamin  - Vitamin B12  6. Bone spur of left ankle  - traMADol (ULTRAM) 50 MG tablet; Take 1 tablet (50 mg total) by mouth 4 (four) times daily.  Dispense: 40 tablet; Refill: 0

## 2016-07-12 LAB — VITAMIN D 25 HYDROXY (VIT D DEFICIENCY, FRACTURES): VIT D 25 HYDROXY: 36 ng/mL (ref 30–100)

## 2016-07-13 ENCOUNTER — Other Ambulatory Visit: Payer: Self-pay | Admitting: Family Medicine

## 2016-07-18 ENCOUNTER — Ambulatory Visit (INDEPENDENT_AMBULATORY_CARE_PROVIDER_SITE_OTHER): Payer: 59

## 2016-07-18 DIAGNOSIS — E538 Deficiency of other specified B group vitamins: Secondary | ICD-10-CM | POA: Diagnosis not present

## 2016-07-18 MED ORDER — CYANOCOBALAMIN 1000 MCG/ML IJ SOLN
1000.0000 ug | Freq: Once | INTRAMUSCULAR | Status: AC
  Administered 2016-07-18: 1000 ug via INTRAMUSCULAR

## 2016-08-11 ENCOUNTER — Telehealth: Payer: Self-pay

## 2016-08-11 ENCOUNTER — Telehealth: Payer: 59 | Admitting: Family Medicine

## 2016-08-11 DIAGNOSIS — R300 Dysuria: Secondary | ICD-10-CM

## 2016-08-11 NOTE — Telephone Encounter (Signed)
We can usually treat females over the phone for cystitis, for males we need cultures and make sure it is not prostatitis or epididymitis. He may be able to see [place] at 7:40 am, or try employee health. He cannot delay to be seen. Another option is urgent care. I am sorry

## 2016-08-11 NOTE — Progress Notes (Signed)
  Based on what you shared with me it looks like you have a serious condition that should be evaluated in a face to face office visit.  If you are having a true medical emergency please call 911.  If you need an urgent face to face visit, [facility] has four urgent care centers for your convenience.  If you need care fast and have a high deductible or no insurance consider:   [email or web address]  [phone]  [address], [place], [place] [address] 8 am to 8 pm Monday-Friday 10 am to 4 pm Saturday-Sunday   The following sites will take your  insurance:    . [facility] Urgent [place] a Provider at this Location  [address] [place], [place] [address] . 10 am to 8 pm Monday-Friday . 12 pm to 8 pm Saturday-Sunday   . [facility] at [place] a Provider at this Location  [address], [place], [place] [address] . 8 am to 8 pm Monday-Friday . 9 am to 6 pm Saturday . 11 am to 6 pm Sunday   . [facility] at [place] Get Driving Directions  [id number], [place] [address] . 8 am to 8 pm Monday-Friday . 8 am to 4 pm Saturday-Sunday   . [facility] (a walk in primary care provider)  [place] a Provider at this Location  [place], [place] [address] . 8 am to 8:30 pm Monday-Thursday . 8 am to 6 pm Friday . 8 am to 4 pm Saturday-Sunday   Your e-visit answers were reviewed by a board certified advanced clinical practitioner to complete your personal care plan.  Thank you for using e-Visits.  We are sorry that you are not feeling well.  Here is how we plan to help!  Male bladder infections are not very common.  We worry about prostate or kidney conditions.  The standard of care is to examine the  abdomen and kidneys, and to do a urine and blood test to make sure that something more serious is not going on.  We recommend that you see a provider today.  If your doctor's office is closed [facility] has the following Urgent Cares:

## 2016-08-11 NOTE — Telephone Encounter (Signed)
Pt called states tried to do E visit but did not work out for him.  He wanted to see about getting rx for antibiotic for UTI symptoms started this am frequency and burning.  Pt states unable to come in for visit.

## 2016-08-12 NOTE — Telephone Encounter (Signed)
Patient notified by MyChart.

## 2016-08-15 ENCOUNTER — Ambulatory Visit (INDEPENDENT_AMBULATORY_CARE_PROVIDER_SITE_OTHER): Payer: 59

## 2016-08-15 ENCOUNTER — Ambulatory Visit: Payer: 59

## 2016-08-15 DIAGNOSIS — D519 Vitamin B12 deficiency anemia, unspecified: Secondary | ICD-10-CM | POA: Diagnosis not present

## 2016-08-15 MED ORDER — CYANOCOBALAMIN 1000 MCG/ML IJ SOLN
1000.0000 ug | Freq: Once | INTRAMUSCULAR | Status: AC
  Administered 2016-08-15: 1000 ug via INTRAMUSCULAR

## 2016-09-26 ENCOUNTER — Ambulatory Visit (INDEPENDENT_AMBULATORY_CARE_PROVIDER_SITE_OTHER): Payer: 59

## 2016-09-26 DIAGNOSIS — E538 Deficiency of other specified B group vitamins: Secondary | ICD-10-CM | POA: Diagnosis not present

## 2016-09-26 MED ORDER — CYANOCOBALAMIN 1000 MCG/ML IJ SOLN
1000.0000 ug | Freq: Once | INTRAMUSCULAR | Status: AC
  Administered 2016-09-26: 1000 ug via INTRAMUSCULAR

## 2016-10-01 ENCOUNTER — Other Ambulatory Visit: Payer: Self-pay | Admitting: Family Medicine

## 2016-10-01 NOTE — Telephone Encounter (Signed)
Patient requesting refill of Synthroid to [facility].

## 2016-10-10 ENCOUNTER — Ambulatory Visit: Payer: 59 | Admitting: Family Medicine

## 2016-10-17 ENCOUNTER — Encounter: Payer: Self-pay | Admitting: Family Medicine

## 2016-10-17 ENCOUNTER — Ambulatory Visit (INDEPENDENT_AMBULATORY_CARE_PROVIDER_SITE_OTHER): Payer: 59 | Admitting: Family Medicine

## 2016-10-17 VITALS — BP 124/88 | HR 66 | Temp 98.2°F | Resp 16 | Ht 70.0 in | Wt 203.5 lb

## 2016-10-17 DIAGNOSIS — E038 Other specified hypothyroidism: Secondary | ICD-10-CM

## 2016-10-17 DIAGNOSIS — E559 Vitamin D deficiency, unspecified: Secondary | ICD-10-CM

## 2016-10-17 DIAGNOSIS — Z23 Encounter for immunization: Secondary | ICD-10-CM | POA: Diagnosis not present

## 2016-10-17 DIAGNOSIS — Z79899 Other long term (current) drug therapy: Secondary | ICD-10-CM | POA: Diagnosis not present

## 2016-10-17 DIAGNOSIS — Z8639 Personal history of other endocrine, nutritional and metabolic disease: Secondary | ICD-10-CM | POA: Diagnosis not present

## 2016-10-17 DIAGNOSIS — E538 Deficiency of other specified B group vitamins: Secondary | ICD-10-CM | POA: Diagnosis not present

## 2016-10-17 DIAGNOSIS — Z131 Encounter for screening for diabetes mellitus: Secondary | ICD-10-CM | POA: Diagnosis not present

## 2016-10-17 DIAGNOSIS — Z8679 Personal history of other diseases of the circulatory system: Secondary | ICD-10-CM

## 2016-10-17 DIAGNOSIS — Z13220 Encounter for screening for lipoid disorders: Secondary | ICD-10-CM

## 2016-10-17 DIAGNOSIS — M25572 Pain in left ankle and joints of left foot: Secondary | ICD-10-CM

## 2016-10-17 DIAGNOSIS — M7752 Other enthesopathy of left foot: Secondary | ICD-10-CM

## 2016-10-17 DIAGNOSIS — G8929 Other chronic pain: Secondary | ICD-10-CM | POA: Diagnosis not present

## 2016-10-17 LAB — CBC WITH DIFFERENTIAL/PLATELET
BASOS PCT: 0 %
Basophils Absolute: 0 cells/uL (ref 0–200)
EOS ABS: 142 {cells}/uL (ref 15–500)
Eosinophils Relative: 2 %
HEMATOCRIT: 45.1 % (ref 38.5–50.0)
Hemoglobin: 15.4 g/dL (ref 13.2–17.1)
LYMPHS ABS: 3124 {cells}/uL (ref 850–3900)
LYMPHS PCT: 44 %
MCH: 27.9 pg (ref 27.0–33.0)
MCHC: 34.1 g/dL (ref 32.0–36.0)
MCV: 81.7 fL (ref 80.0–100.0)
MONO ABS: 497 {cells}/uL (ref 200–950)
MPV: 10.3 fL (ref 7.5–12.5)
Monocytes Relative: 7 %
NEUTROS ABS: 3337 {cells}/uL (ref 1500–7800)
Neutrophils Relative %: 47 %
PLATELETS: 277 10*3/uL (ref 140–400)
RBC: 5.52 MIL/uL (ref 4.20–5.80)
RDW: 14.1 % (ref 11.0–15.0)
WBC: 7.1 10*3/uL (ref 3.8–10.8)

## 2016-10-17 LAB — VITAMIN B12: VITAMIN B 12: 529 pg/mL (ref 200–1100)

## 2016-10-17 LAB — COMPLETE METABOLIC PANEL WITH GFR
ALBUMIN: 4.9 g/dL (ref 3.6–5.1)
ALK PHOS: 41 U/L (ref 40–115)
ALT: 31 U/L (ref 9–46)
AST: 20 U/L (ref 10–40)
BILIRUBIN TOTAL: 0.7 mg/dL (ref 0.2–1.2)
BUN: 12 mg/dL (ref 7–25)
CALCIUM: 9.8 mg/dL (ref 8.6–10.3)
CO2: 24 mmol/L (ref 20–31)
Chloride: 106 mmol/L (ref 98–110)
Creat: 1.08 mg/dL (ref 0.60–1.35)
GLUCOSE: 77 mg/dL (ref 65–99)
POTASSIUM: 3.8 mmol/L (ref 3.5–5.3)
SODIUM: 142 mmol/L (ref 135–146)
TOTAL PROTEIN: 7.4 g/dL (ref 6.1–8.1)

## 2016-10-17 LAB — LIPID PANEL
CHOL/HDL RATIO: 3.3 ratio (ref <=5.0)
CHOLESTEROL: 146 mg/dL (ref 125–200)
HDL: 44 mg/dL (ref >=40)
LDL Cholesterol: 86 mg/dL (ref <130)
Triglycerides: 82 mg/dL (ref <150)
VLDL: 16 mg/dL (ref <30)

## 2016-10-17 LAB — TSH: TSH: 1.64 m[IU]/L (ref 0.40–4.50)

## 2016-10-17 MED ORDER — TRAMADOL HCL 50 MG PO TABS: 50.0000 mg | ORAL_TABLET | Freq: Four times a day (QID) | ORAL | 0 refills | Status: DC

## 2016-10-17 MED ORDER — PHENTERMINE-TOPIRAMATE ER 7.5-46 MG PO CP24: 1.0000 | ORAL_CAPSULE | Freq: Every day | ORAL | 2 refills | Status: DC

## 2016-10-17 MED ORDER — METHYLCOBALAMIN 1000 MCG SL SUBL: 1.0000 | SUBLINGUAL_TABLET | Freq: Every day | SUBLINGUAL | 0 refills | Status: DC

## 2016-10-17 NOTE — Progress Notes (Signed)
Name: Oscar Allen   MRN: [id number]    DOB: 01-10-1986   Date:10/17/2016       Progress Note  Subjective  Chief Complaint  Chief Complaint  Patient presents with  . Thyroid Problem  . Ankle Pain    pt needs refill on tramadol    HPI  Ankle pain: he states that symptoms started three years ago, without injury. Seen by [place] and was diagnosed with possible peroneal tendon split and he was given a couple steroid injections and symptoms improved. Since that time he has been having intermittent pain. He is doing much better now, but occasionally has swelling on left lateral ankle and takes Tramadol with Naproxen prn.   Obesity: he tried Contrave, but did not noticed any change in his appetite, also tool Belviq for the past 6 weeks and he gained weight.  He is still going to the gym about 3 times per weekly. He states since he started on Qsymia 11/2016, initial weight of 251 lbs and is down to 203.8 lbs , total loss of almost 50 lbs He is doing very well. He is still very consistent with his diet and exercise  Hypothyroidism: taking medication and denies side effects of medication. He has noticed increase in fatigue lately, still taking vitamin D but not taking B12   Gout: no recent episodes of gout, his uric acid was very high in the past but did not want to take medication  Insomnia: he is doing well since he lost more weight   Patient Active Problem List   Diagnosis Date Noted  . Bone spur of left ankle 03/28/2016  . Insomnia, persistent 08/26/2015  . History of hypertension 08/26/2015  . Adult hypothyroidism 08/26/2015  . Low serum cobalamin 08/26/2015  . Adult BMI 30+ 08/26/2015  . Gout of big toe 08/26/2015  . Vitamin D deficiency 08/26/2015    Past Surgical History:  Procedure Laterality Date  . EYE SURGERY Bilateral   . FOOT SURGERY Right   . MANDIBLE FRACTURE SURGERY    . RHINOPLASTY      Family History  Problem Relation Age of Onset  .  Anxiety disorder Mother   . Hypothyroidism Father   . Heart Problems Brother     Social History   Social History  . Marital status: Single    Spouse name: N/A  . Number of children: N/A  . Years of education: N/A   Occupational History  . Not on file.   Social History Main Topics  . Smoking status: Never Smoker  . Smokeless tobacco: Never Used  . Alcohol use 0.0 oz/week     Comment: OCCASIONALLY  . Drug use: No  . Sexual activity: Yes    Partners: Female   Other Topics Concern  . Not on file   Social History Narrative  . No narrative on file     Current Outpatient Prescriptions:  .  Cholecalciferol (VITAMIN D) 2000 units CAPS, Take 1 capsule by mouth daily., Disp: , Rfl:  .  colchicine 0.6 MG tablet, Take 2 tablets (1.2 mg total) by mouth daily as needed (and repat in one hour if no resolution of pain). (Patient not taking: Reported on 01/25/2016), Disp: 30 tablet, Rfl: 0 .  Methylcobalamin 1000 MCG SUBL, Place 1 tablet under the tongue daily., Disp: 30 tablet, Rfl: 0 .  naproxen (NAPROSYN) 500 MG tablet, Take 1 tablet (500 mg total) by mouth 2 (two) times daily with a meal., Disp: 60 tablet,  Rfl: 0 .  Phentermine-Topiramate 7.5-46 MG CP24, Take 1 capsule by mouth daily., Disp: 30 capsule, Rfl: 2 .  SYNTHROID 75 MCG tablet, TAKE 1 TABLET BY MOUTH ONCE DAILY, Disp: 90 tablet, Rfl: 0 .  traMADol (ULTRAM) 50 MG tablet, Take 1 tablet (50 mg total) by mouth 4 (four) times daily., Disp: 40 tablet, Rfl: 0  Allergies  Allergen Reactions  . Rhus Coriaria  [[place]]      ROS  Constitutional: Negative for fever , positive for weight change.  Respiratory: Negative for cough and shortness of breath.   Cardiovascular: Negative for chest pain or palpitations.  Gastrointestinal: Negative for abdominal pain, no bowel changes.  Musculoskeletal: Negative for gait problem or joint swelling ( but left ankle gets swollen at times).  Skin: Negative for rash.  Neurological: Negative for  dizziness or headache.  No other specific complaints in a complete review of systems (except as listed in HPI above).  Objective  Vitals:   10/17/16 1346  BP: 124/88  Pulse: 66  Resp: 16  Temp: 98.2 F (36.8 C)  TempSrc: Oral  SpO2: 98%  Weight: 203 lb 8 oz (92.3 kg)  Height: 5\' 10"  (1.778 m)    Body mass index is 29.2 kg/m.  Physical Exam  Constitutional: Patient appears well-developed and well-nourished. Obese  No distress.  HEENT: head atraumatic, normocephalic, pupils equal and reactive to light, neck supple, throat within normal limits Cardiovascular: Normal rate, regular rhythm and normal heart sounds.  No murmur heard. No BLE edema. Pulmonary/Chest: Effort normal and breath sounds normal. No respiratory distress. Abdominal: Soft.  There is no tenderness. Psychiatric: Patient has a normal mood and affect. behavior is normal. Judgment and thought content normal.  PHQ2/9: Depression screen [facility] 2/9 10/17/2016 07/11/2016 03/28/2016 01/25/2016 11/02/2015  Decreased Interest 0 0 0 0 0  Down, Depressed, Hopeless 0 0 0 0 0  PHQ - 2 Score 0 0 0 0 0     Fall Risk: Fall Risk  10/17/2016 07/11/2016 03/28/2016 01/25/2016 11/02/2015  Falls in the past year? No No No No No    Assessment & Plan  1. Other specified hypothyroidism  - TSH  2. History of obesity  - Phentermine-Topiramate 7.5-46 MG CP24; Take 1 capsule by mouth daily.  Dispense: 30 capsule; Refill: 2  3. B12 deficiency  - Methylcobalamin 1000 MCG SUBL; Place 1 tablet under the tongue daily.  Dispense: 30 tablet; Refill: 0 - Vitamin B12  4. History of hypertension  Resolved with weight loss  5. Lipid screening  - Lipid panel  6. Vitamin D deficiency  - VITAMIN D 25 Hydroxy (Vit-D Deficiency, Fractures)  7. Bone spur of left ankle  - traMADol (ULTRAM) 50 MG tablet; Take 1 tablet (50 mg total) by mouth 4 (four) times daily.  Dispense: 40 tablet; Refill: 0  8. Chronic pain of left ankle   9. Diabetes  mellitus screening  - Hemoglobin A1c  10. Long-term use of high-risk medication  - COMPLETE METABOLIC PANEL WITH GFR - CBC with Differential/Platelet  11. Need for Tdap vaccination  - Tdap vaccine greater than or equal to 7yo IM

## 2016-10-18 LAB — VITAMIN D 25 HYDROXY (VIT D DEFICIENCY, FRACTURES): VIT D 25 HYDROXY: 39 ng/mL (ref 30–100)

## 2016-10-18 LAB — HEMOGLOBIN A1C
Hgb A1c MFr Bld: 4.9 % (ref <5.7)
MEAN PLASMA GLUCOSE: 94 mg/dL

## 2016-11-24 ENCOUNTER — Ambulatory Visit (INDEPENDENT_AMBULATORY_CARE_PROVIDER_SITE_OTHER): Payer: 59 | Admitting: Family Medicine

## 2016-11-24 ENCOUNTER — Encounter: Payer: Self-pay | Admitting: Family Medicine

## 2016-11-24 VITALS — BP 122/78 | HR 68 | Temp 99.0°F | Resp 16 | Ht 70.0 in | Wt 205.5 lb

## 2016-11-24 DIAGNOSIS — J029 Acute pharyngitis, unspecified: Secondary | ICD-10-CM

## 2016-11-24 DIAGNOSIS — M542 Cervicalgia: Secondary | ICD-10-CM | POA: Diagnosis not present

## 2016-11-24 DIAGNOSIS — J0110 Acute frontal sinusitis, unspecified: Secondary | ICD-10-CM

## 2016-11-24 LAB — POCT RAPID STREP A (OFFICE): Rapid Strep A Screen: NEGATIVE

## 2016-11-24 MED ORDER — AMOXICILLIN-POT CLAVULANATE 875-125 MG PO TABS: 1.0000 | ORAL_TABLET | Freq: Two times a day (BID) | ORAL | 0 refills | Status: DC

## 2016-11-24 MED ORDER — TIZANIDINE HCL 2 MG PO CAPS: 2.0000 mg | ORAL_CAPSULE | Freq: Two times a day (BID) | ORAL | 0 refills | Status: DC | PRN

## 2016-11-24 NOTE — Progress Notes (Signed)
Name: Oscar Allen   MRN: [id number]    DOB: 21-Oct-1986   Date:11/24/2016       Progress Note  Subjective  Chief Complaint  Chief Complaint  Patient presents with  . Acute Visit    Fever, upset stomach, neck and back aches x3 days  This patient is usually followed by Dr. Ancil Boozer, new to me  Fever   This is a new problem. The current episode started in the past 7 days. The problem has been gradually improving. The maximum temperature noted was 101 to 101.9 F. Associated symptoms include abdominal pain, congestion, coughing, headaches, muscle aches, nausea and a sore throat. Pertinent negatives include no chest pain or wheezing. He has tried fluids and acetaminophen (Tramadol with Naproxen) for the symptoms.     Past Medical History:  Diagnosis Date  . Gout   . Thyroid disorder     Past Surgical History:  Procedure Laterality Date  . EYE SURGERY Bilateral   . FOOT SURGERY Right   . MANDIBLE FRACTURE SURGERY    . RHINOPLASTY      Family History  Problem Relation Age of Onset  . Anxiety disorder Mother   . Hypothyroidism Father   . Heart Problems Brother     Social History   Social History  . Marital status: Single    Spouse name: N/A  . Number of children: N/A  . Years of education: N/A   Occupational History  . Not on file.   Social History Main Topics  . Smoking status: Never Smoker  . Smokeless tobacco: Never Used  . Alcohol use 0.0 oz/week     Comment: OCCASIONALLY  . Drug use: No  . Sexual activity: Yes    Partners: Female   Other Topics Concern  . Not on file   Social History Narrative  . No narrative on file     Current Outpatient Prescriptions:  .  Cholecalciferol (VITAMIN D) 2000 units CAPS, Take 1 capsule by mouth daily., Disp: , Rfl:  .  colchicine 0.6 MG tablet, Take 2 tablets (1.2 mg total) by mouth daily as needed (and repat in one hour if no resolution of pain)., Disp: 30 tablet, Rfl: 0 .  Methylcobalamin 1000 MCG SUBL, Place 1  tablet under the tongue daily., Disp: 30 tablet, Rfl: 0 .  naproxen (NAPROSYN) 500 MG tablet, Take 1 tablet (500 mg total) by mouth 2 (two) times daily with a meal., Disp: 60 tablet, Rfl: 0 .  Phentermine-Topiramate 7.5-46 MG CP24, Take 1 capsule by mouth daily., Disp: 30 capsule, Rfl: 2 .  SYNTHROID 75 MCG tablet, TAKE 1 TABLET BY MOUTH ONCE DAILY, Disp: 90 tablet, Rfl: 0 .  traMADol (ULTRAM) 50 MG tablet, Take 1 tablet (50 mg total) by mouth 4 (four) times daily., Disp: 40 tablet, Rfl: 0  Allergies  Allergen Reactions  . Rhus Coriaria  [[place]]      Review of Systems  Constitutional: Positive for fever.  HENT: Positive for congestion and sore throat.   Respiratory: Positive for cough and sputum production. Negative for shortness of breath and wheezing.   Cardiovascular: Negative for chest pain.  Gastrointestinal: Positive for abdominal pain and nausea.  Neurological: Positive for headaches.     Objective  Vitals:   11/24/16 1530  BP: 122/78  Pulse: 68  Resp: 16  Temp: 99 F (37.2 C)  TempSrc: Oral  SpO2: 97%  Weight: 205 lb 8 oz (93.2 kg)  Height: 5\' 10"  (1.778 m)  Physical Exam  Constitutional: He is oriented to person, place, and time and well-developed, well-nourished, and in no distress.  HENT:  Right Ear: Tympanic membrane and ear canal normal.  Left Ear: Tympanic membrane and ear canal normal.  Nose: Right sinus exhibits no maxillary sinus tenderness and no frontal sinus tenderness. Left sinus exhibits no maxillary sinus tenderness and no frontal sinus tenderness.  Mouth/Throat: Posterior oropharyngeal erythema present.  Neck: Neck supple.  Cardiovascular: Normal rate, regular rhythm, S1 normal, S2 normal and normal heart sounds.   No murmur heard. Pulmonary/Chest: Effort normal and breath sounds normal.  Neurological: He is alert and oriented to person, place, and time.  Psychiatric: Mood, memory, affect and judgment normal.  Nursing note and vitals  reviewed.      Assessment & Plan  1. Sore throat Rapid strep test is negative - POCT rapid strep A  2. Acute non-recurrent frontal sinusitis Based on symptoms and exam, we'll start on antibiotic therapy. - amoxicillin-clavulanate (AUGMENTIN) 875-125 MG tablet; Take 1 tablet by mouth 2 (two) times daily.  Dispense: 20 tablet; Refill: 0  3. Posterior neck pain Likely associated with myalgia from acute infection. Started on tizanidine as needed. Advised to use heating pad. - tizanidine (ZANAFLEX) 2 MG capsule; Take 1 capsule (2 mg total) by mouth 2 (two) times daily as needed for muscle spasms.  Dispense: 14 capsule; Refill: 0   Eustolia Drennen Asad A. [facility] 11/24/2016 3:39 PM

## 2016-11-28 ENCOUNTER — Ambulatory Visit (INDEPENDENT_AMBULATORY_CARE_PROVIDER_SITE_OTHER): Payer: 59 | Admitting: Family Medicine

## 2016-11-28 ENCOUNTER — Encounter: Payer: Self-pay | Admitting: Family Medicine

## 2016-11-28 VITALS — BP 116/66 | HR 58 | Temp 98.1°F | Resp 18 | Ht 70.0 in | Wt 202.1 lb

## 2016-11-28 DIAGNOSIS — E038 Other specified hypothyroidism: Secondary | ICD-10-CM

## 2016-11-28 DIAGNOSIS — M7752 Other enthesopathy of left foot: Secondary | ICD-10-CM

## 2016-11-28 DIAGNOSIS — Z8679 Personal history of other diseases of the circulatory system: Secondary | ICD-10-CM

## 2016-11-28 DIAGNOSIS — Z8639 Personal history of other endocrine, nutritional and metabolic disease: Secondary | ICD-10-CM | POA: Diagnosis not present

## 2016-11-28 DIAGNOSIS — B349 Viral infection, unspecified: Secondary | ICD-10-CM

## 2016-11-28 MED ORDER — SYNTHROID 75 MCG PO TABS: 75.0000 ug | ORAL_TABLET | Freq: Every day | ORAL | 1 refills | Status: DC

## 2016-11-28 NOTE — Progress Notes (Addendum)
Name: Oscar Allen   MRN: [id number]    DOB: [birth year]   Date:11/28/2016       Progress Note  Subjective  Chief Complaint  Chief Complaint  Patient presents with  . Medication Refill    3 month F/U  . Hypothyroidism    constipation intermittenly  . Obesity    Doing well with weight loss, stop Qsymia due to working out and doing on his own. Has lost 4 more pounds last visit.   . Gout    Has had no recent issues with it  . Insomnia    Well controlled    HPI  Hypothyroidism: he is doing well on same dose of Synthroid. Weight has been stable, no dysphagia. He has been feeling tired because he is recovering from flu like illness.   Ankle pain: he states that symptoms started three years ago, without injury. Seen by [facility] and was diagnosed with possible peroneal tendon split and he was given a couple steroid injections and symptoms improved. Since that time he has been having intermittent pain. He is doing much better now, but occasionally has swelling on left lateral ankle and takes Tramadol with Naproxen prn. He states that no changes since last visit    Obesity: he tried Contrave, but did not noticed any change in his appetite, also tool Belviq for the past 6 weeks and he gained weight.  He is still going to the gym about 3 times per weekly. He states since he started on Qsymia 11/2016, initial weight of 251 lbs and is down to 202.1lbs , total loss of almost 50 lbs He is doing very well. He is still very consistent with his diet and exercise, he weaned self off Qsymia and appetite has not changed.   Flu like illness: he was very sick this past weekend, he had neck, back pain, fever, sore throat, rhinorrhea, dry cough, nasal congestion, chills. Dr. Manuella Ghazi gave him augmentin prescription but he never started, back to work since yesterday. He still has a mild cough, still feeling tired, appetite is back to normal. Chills resolved  Insomnia: usually sleeping well, except  from this past week because he was sick  Patient Active Problem List   Diagnosis Date Noted  . Bone spur of left ankle 03/28/2016  . Insomnia, persistent 08/26/2015  . History of hypertension 08/26/2015  . Adult hypothyroidism 08/26/2015  . Low serum cobalamin 08/26/2015  . Adult BMI 30+ 08/26/2015  . Gout of big toe 08/26/2015  . Vitamin D deficiency 08/26/2015    Past Surgical History:  Procedure Laterality Date  . EYE SURGERY Bilateral   . FOOT SURGERY Right   . MANDIBLE FRACTURE SURGERY    . RHINOPLASTY      Family History  Problem Relation Age of Onset  . Anxiety disorder Mother   . Hypothyroidism Father   . Heart Problems Brother     Social History   Social History  . Marital status: Single    Spouse name: N/A  . Number of children: N/A  . Years of education: N/A   Occupational History  . Not on file.   Social History Main Topics  . Smoking status: Never Smoker  . Smokeless tobacco: Never Used  . Alcohol use 0.0 oz/week     Comment: OCCASIONALLY  . Drug use: No  . Sexual activity: Yes    Partners: Female   Other Topics Concern  . Not on file   Social History Narrative  .  No narrative on file     Current Outpatient Prescriptions:  .  Cholecalciferol (VITAMIN D) 2000 units CAPS, Take 1 capsule by mouth daily., Disp: , Rfl:  .  colchicine 0.6 MG tablet, Take 2 tablets (1.2 mg total) by mouth daily as needed (and repat in one hour if no resolution of pain)., Disp: 30 tablet, Rfl: 0 .  Methylcobalamin 1000 MCG SUBL, Place 1 tablet under the tongue daily., Disp: 30 tablet, Rfl: 0 .  naproxen (NAPROSYN) 500 MG tablet, Take 1 tablet (500 mg total) by mouth 2 (two) times daily with a meal., Disp: 60 tablet, Rfl: 0 .  SYNTHROID 75 MCG tablet, Take 1 tablet (75 mcg total) by mouth daily., Disp: 90 tablet, Rfl: 1 .  traMADol (ULTRAM) 50 MG tablet, Take 1 tablet (50 mg total) by mouth 4 (four) times daily., Disp: 40 tablet, Rfl: 0  Allergies  Allergen  Reactions  . Rhus Coriaria  [[place]]      ROS  Ten systems reviewed and is negative except as mentioned in HPI    Objective  Vitals:   11/28/16 1535  BP: 116/66  Pulse: (!) 58  Resp: 18  Temp: 98.1 F (36.7 C)  TempSrc: Oral  SpO2: 97%  Weight: 202 lb 1.6 oz (91.7 kg)  Height: _0  (1.778 m)    Body mass index is 29 kg/m.  Physical Exam  Constitutional: Patient appears well-developed and well-nourished. Overweight No distress.  HEENT: head atraumatic, normocephalic, pupils equal and reactive to light, left conjunctiva is injected ( he does not wear contacts- likely viral - call back if no improvement by Monday ), neck supple, throat within normal limits Cardiovascular: Normal rate, regular rhythm and normal heart sounds.  No murmur heard. No BLE edema. Pulmonary/Chest: Effort normal and breath sounds normal. No respiratory distress. Abdominal: Soft.  There is no tenderness. Psychiatric: Patient has a normal mood and affect. behavior is normal. Judgment and thought content normal.  Recent Results (from the past 2160 hour(s))  COMPLETE METABOLIC PANEL WITH GFR     Status: None   Collection Time: 10/17/16  2:58 PM  Result Value Ref Range   Sodium 142 135 - 146 mmol/L   Potassium 3.8 3.5 - 5.3 mmol/L   Chloride 106 98 - 110 mmol/L   CO2 24 20 - 31 mmol/L   Glucose, Bld 77 65 - 99 mg/dL   BUN 12 7 - 25 mg/dL   Creat 1.08 0.60 - 1.35 mg/dL   Total Bilirubin 0.7 0.2 - 1.2 mg/dL   Alkaline Phosphatase 41 40 - 115 U/L   AST 20 10 - 40 U/L   ALT 31 9 - 46 U/L   Total Protein 7.4 6.1 - 8.1 g/dL   Albumin 4.9 3.6 - 5.1 g/dL   Calcium 9.8 8.6 - 10.3 mg/dL   GFR, Est African American >89 >=60 mL/min   GFR, Est Non African American >89 >=60 mL/min  TSH     Status: None   Collection Time: 10/17/16  2:58 PM  Result Value Ref Range   TSH 1.64 0.40 - 4.50 mIU/L  Vitamin B12     Status: None   Collection Time: 10/17/16  2:58 PM  Result Value Ref Range   Vitamin B-12 529  200 - 1,100 pg/mL  VITAMIN D 25 Hydroxy (Vit-D Deficiency, Fractures)     Status: None   Collection Time: 10/17/16  2:58 PM  Result Value Ref Range   Vit D, 25-Hydroxy 39 30 -  100 ng/mL    Comment: Vitamin D Status           25-OH Vitamin D        Deficiency                <20 ng/mL        Insufficiency         20 - 29 ng/mL        Optimal             > or = 30 ng/mL   For 25-OH Vitamin D testing on patients on D2-supplementation and patients for whom quantitation of D2 and D3 fractions is required, the [facility] 25-OH VIT D, (D2,D3), LC/MS/MS is recommended: order code [id number] (patients > 2 yrs).   Lipid panel     Status: None   Collection Time: 10/17/16  2:58 PM  Result Value Ref Range   Cholesterol 146 125 - 200 mg/dL   Triglycerides 82 <150 mg/dL   HDL 44 >=40 mg/dL   Total CHOL/HDL Ratio 3.3 <=5.0 Ratio   VLDL 16 <30 mg/dL   LDL Cholesterol 86 <130 mg/dL    Comment:   Total Cholesterol/HDL Ratio:CHD Risk                        Coronary Heart Disease Risk Table                                        Men       Women          1/2 Average Risk              3.4        3.3              Average Risk              5.0        4.4           2X Average Risk              9.6        7.1           3X Average Risk             23.4       11.0 Use the calculated Patient Ratio above and the CHD Risk table  to determine the patient's CHD Risk.   CBC with Differential/Platelet     Status: None   Collection Time: 10/17/16  2:58 PM  Result Value Ref Range   WBC 7.1 3.8 - 10.8 K/uL   RBC 5.52 4.20 - 5.80 MIL/uL   Hemoglobin 15.4 13.2 - 17.1 g/dL   HCT 45.1 38.5 - 50.0 %   MCV 81.7 80.0 - 100.0 fL   MCH 27.9 27.0 - 33.0 pg   MCHC 34.1 32.0 - 36.0 g/dL   RDW 14.1 11.0 - 15.0 %   Platelets 277 140 - 400 K/uL   MPV 10.3 7.5 - 12.5 fL   Neutro Abs 3,337 1,500 - 7,800 cells/uL   Lymphs Abs 3,124 850 - 3,900 cells/uL   Monocytes Absolute 497 200 - 950 cells/uL   Eosinophils Absolute 142 15  - 500 cells/uL   Basophils Absolute 0 0 - 200 cells/uL   Neutrophils Relative % 47 %   Lymphocytes Relative 44 %  Monocytes Relative 7 %   Eosinophils Relative 2 %   Basophils Relative 0 %   Smear Review Criteria for review not met   Hemoglobin A1c     Status: None   Collection Time: 10/17/16  2:58 PM  Result Value Ref Range   Hgb A1c MFr Bld 4.9 <5.7 %    Comment:   For the purpose of screening for the presence of diabetes:   <5.7%       Consistent with the absence of diabetes 5.7-6.4 %   Consistent with increased risk for diabetes (prediabetes) >=6.5 %     Consistent with diabetes   This assay result is consistent with a decreased risk of diabetes.   Currently, no consensus exists regarding use of hemoglobin A1c for diagnosis of diabetes in children.   According to American Diabetes Association (ADA) guidelines, hemoglobin A1c <7.0% represents optimal control in non-pregnant diabetic patients. Different metrics may apply to specific patient populations. Standards of Medical Care in Diabetes (ADA).      Mean Plasma Glucose 94 mg/dL  POCT rapid strep A     Status: None   Collection Time: 11/24/16  3:50 PM  Result Value Ref Range   Rapid Strep A Screen Negative Negative     PHQ2/9: Depression screen [facility] 2/9 11/24/2016 10/17/2016 07/11/2016 03/28/2016 01/25/2016  Decreased Interest 0 0 0 0 0  Down, Depressed, Hopeless 0 0 0 0 0  PHQ - 2 Score 0 0 0 0 0     Fall Risk: Fall Risk  11/24/2016 10/17/2016 07/11/2016 03/28/2016 01/25/2016  Falls in the past year? _0       Assessment & Plan  1. Other specified hypothyroidism  - SYNTHROID 75 MCG tablet; Take 1 tablet (75 mcg total) by mouth daily.  Dispense: 90 tablet; Refill: 1  2. History of obesity  Off medication and is doing well   3. History of hypertension  Resolved with weight loss  4. Viral illness  Likely flu , feeling well now  5. Bone spur of left ankle  Doing well at this time

## 2016-12-05 ENCOUNTER — Ambulatory Visit: Payer: 59 | Admitting: Family Medicine

## 2017-01-23 ENCOUNTER — Other Ambulatory Visit: Payer: Self-pay | Admitting: Family Medicine

## 2017-01-23 ENCOUNTER — Telehealth: Payer: Self-pay | Admitting: Family Medicine

## 2017-01-23 DIAGNOSIS — M109 Gout, unspecified: Secondary | ICD-10-CM

## 2017-01-23 MED ORDER — COLCHICINE 0.6 MG PO TABS: 1.2000 mg | ORAL_TABLET | Freq: Every day | ORAL | 0 refills | Status: DC | PRN

## 2017-01-23 NOTE — Telephone Encounter (Signed)
Patient is requesting a refill on Colchicine 0.6mg  for his gout flare.  He would also like for the pain.  He has been using the Tylenol & Ibuprofen but it's not helping.  Patient uses the [facility].

## 2017-01-30 ENCOUNTER — Other Ambulatory Visit: Payer: Self-pay | Admitting: Family Medicine

## 2017-01-30 ENCOUNTER — Telehealth: Payer: Self-pay | Admitting: Family Medicine

## 2017-01-30 DIAGNOSIS — M7752 Other enthesopathy of left foot: Secondary | ICD-10-CM

## 2017-01-30 NOTE — Telephone Encounter (Signed)
Dr. Ancil Boozer prescribed patient colchicine for gout, however, patient has tried Tylenol and Naproxen. He is wanting a refill on Tramadol. He is asking for this to help offset the pain. [phone]

## 2017-02-03 NOTE — Telephone Encounter (Signed)
He will have to follow up with PCP for prescription of any controlled substances

## 2017-02-04 ENCOUNTER — Other Ambulatory Visit: Payer: Self-pay | Admitting: Family Medicine

## 2017-02-04 NOTE — Telephone Encounter (Signed)
Please advise 

## 2017-02-04 NOTE — Telephone Encounter (Signed)
Unfortunately we can't call in controlled medications with the change in guidelines/per FDA. I am sorry

## 2017-02-05 NOTE — Telephone Encounter (Signed)
Left voice mail informing pt to schedule appt

## 2017-02-06 NOTE — Telephone Encounter (Signed)
Pt informed and states that as of right now he does not need the prescription but will call back to make appointment if he need another refill.

## 2017-04-06 MED FILL — SYNTHROID 75 MCG TABLET: 75 | 90 days supply | Qty: 90 | Fill #0

## 2017-06-08 ENCOUNTER — Other Ambulatory Visit: Payer: Self-pay | Admitting: Family Medicine

## 2017-06-08 ENCOUNTER — Encounter: Payer: Self-pay | Admitting: Family Medicine

## 2017-06-08 MED ORDER — PHENTERMINE-TOPIRAMATE ER 7.5-46 MG PO CP24: 1.0000 | ORAL_CAPSULE | Freq: Every day | ORAL | 0 refills | Status: DC

## 2017-06-12 ENCOUNTER — Encounter: Payer: Self-pay | Admitting: Family Medicine

## 2017-06-12 ENCOUNTER — Ambulatory Visit (INDEPENDENT_AMBULATORY_CARE_PROVIDER_SITE_OTHER): Payer: 59 | Admitting: Family Medicine

## 2017-06-12 VITALS — BP 128/86 | HR 57 | Temp 98.0°F | Resp 16 | Ht 70.0 in | Wt 212.9 lb

## 2017-06-12 DIAGNOSIS — E669 Obesity, unspecified: Secondary | ICD-10-CM

## 2017-06-12 DIAGNOSIS — E038 Other specified hypothyroidism: Secondary | ICD-10-CM | POA: Diagnosis not present

## 2017-06-12 DIAGNOSIS — E66811 Obesity, class 1: Secondary | ICD-10-CM

## 2017-06-12 MED ORDER — PHENTERMINE-TOPIRAMATE ER 7.5-46 MG PO CP24: 1.0000 | ORAL_CAPSULE | Freq: Every day | ORAL | 1 refills | Status: DC

## 2017-06-12 NOTE — Progress Notes (Addendum)
Name: Oscar Allen   MRN: [id number]    DOB: 1986/05/09   Date:06/12/2017       Progress Note  Subjective  Chief Complaint  Chief Complaint  Patient presents with  . Medication Refill    6 month F/U  . Hypothyroidism    Constipation  . Obesity    Started gained weight again and working out 3x weekly. But with diet changes he has been gaining slowly    HPI  Hypothyroidism: he is doing well on same dose of Synthroid. No dysphagia. He went on a vegan diet and caused constipation, he went back to eating meat two weeks ago and bowel movements have improved since. Discussed getting repeat TSH but he wants to hold off because of cost. He will call back if he changes his mind   Obesity: he tried Contrave, but did not noticed any change in his appetite, also tool Belviq for the past 6 weeks and he gained weight.  He is still going to the gym about 3 times per weekly. He states since he started on Qsymia 11/2016, initial weight of 251 lbs and he went as low as 198 lbs,  total loss of over 50 lbs He was doing very well, however recently he has gained weight and able to lose it back, 10 lbs above his last visit six months ago.  He is still very consistent with  Exercise, but not as consistent with his diet lately he had weaned off Qsymia but would like to resume medication.    Patient Active Problem List   Diagnosis Date Noted  . Bone spur of left ankle 03/28/2016  . Insomnia, persistent 08/26/2015  . History of hypertension 08/26/2015  . Adult hypothyroidism 08/26/2015  . B12 deficiency 08/26/2015  . Obesity (BMI 30.0-34.9) 08/26/2015  . Gout of big toe 08/26/2015  . Vitamin D deficiency 08/26/2015    Past Surgical History:  Procedure Laterality Date  . EYE SURGERY Bilateral   . FOOT SURGERY Right   . MANDIBLE FRACTURE SURGERY    . RHINOPLASTY      Family History  Problem Relation Age of Onset  . Anxiety disorder Mother   . Hypothyroidism Father   . Heart Problems  Brother     Social History   Social History  . Marital status: Single    Spouse name: N/A  . Number of children: N/A  . Years of education: N/A   Occupational History  . Not on file.   Social History Main Topics  . Smoking status: Never Smoker  . Smokeless tobacco: Never Used  . Alcohol use 0.0 oz/week     Comment: OCCASIONALLY  . Drug use: No  . Sexual activity: Yes    Partners: Female   Other Topics Concern  . Not on file   Social History Narrative  . No narrative on file     Current Outpatient Prescriptions:  .  Cholecalciferol (VITAMIN D) 2000 units CAPS, Take 1 capsule by mouth daily., Disp: , Rfl:  .  colchicine 0.6 MG tablet, Take 2 tablets (1.2 mg total) by mouth daily as needed (and repat in one hour if no resolution of pain)., Disp: 30 tablet, Rfl: 0 .  Methylcobalamin 1000 MCG SUBL, Place 1 tablet under the tongue daily., Disp: 30 tablet, Rfl: 0 .  naproxen (NAPROSYN) 500 MG tablet, Take 1 tablet (500 mg total) by mouth 2 (two) times daily with a meal., Disp: 60 tablet, Rfl: 0 .  Phentermine-Topiramate (QSYMIA) 7.5-46  MG CP24, Take 1 tablet by mouth daily., Disp: 30 capsule, Rfl: 1 .  SYNTHROID 75 MCG tablet, Take 1 tablet (75 mcg total) by mouth daily., Disp: 90 tablet, Rfl: 1 .  traMADol (ULTRAM) 50 MG tablet, Take 1 tablet (50 mg total) by mouth 4 (four) times daily. (Patient not taking: Reported on 06/12/2017), Disp: 40 tablet, Rfl: 0  Allergies  Allergen Reactions  . Rhus Coriaria [[place]] Hives     ROS  Ten systems reviewed and is negative except as mentioned in HPI   Objective  Vitals:   06/12/17 1428  BP: 128/86  Pulse: (!) 57  Resp: 16  Temp: 98 F (36.7 C)  TempSrc: Oral  SpO2: 98%  Weight: 212 lb 14.4 oz (96.6 kg)  Height: 5\' 10"  (1.778 m)    Body mass index is 30.55 kg/m.  Physical Exam  Constitutional: Patient appears well-developed and well-nourished. Obese. No distress.  HEENT: head atraumatic, normocephalic, pupils equal  and reactive to light, neck supple, throat within normal limits Cardiovascular: Normal rate, regular rhythm and normal heart sounds.  No murmur heard. No BLE edema. Pulmonary/Chest: Effort normal and breath sounds normal. No respiratory distress. Abdominal: Soft.  There is no tenderness. Psychiatric: Patient has a normal mood and affect. behavior is normal. Judgment and thought content normal.  PHQ2/9: Depression screen [facility] 2/9 06/12/2017 11/24/2016 10/17/2016 07/11/2016 03/28/2016  Decreased Interest 0 0 0 0 0  Down, Depressed, Hopeless 0 0 0 0 0  PHQ - 2 Score 0 0 0 0 0     Fall Risk: Fall Risk  06/12/2017 11/24/2016 10/17/2016 07/11/2016 03/28/2016  Falls in the past year? No No No No No     Functional Status Survey: Is the patient deaf or have difficulty hearing?: No Does the patient have difficulty seeing, even when wearing glasses/contacts?: No Does the patient have difficulty concentrating, remembering, or making decisions?: No Does the patient have difficulty walking or climbing stairs?: No Does the patient have difficulty dressing or bathing?: No Does the patient have difficulty doing errands alone such as visiting a doctor's office or shopping?: No    Assessment & Plan  1. Other specified hypothyroidism  He wants to hold off on getting TSH done  2. Obesity (BMI 30.0-34.9)  Discussed importance of 150 minutes of physical activity weekly, eat two servings of fish weekly, eat one serving of tree nuts ( cashews, pistachios, pecans, almonds.Marland Kitchen) every other day, eat 6 servings of fruit/vegetables daily and drink plenty of water and avoid sweet beverages.  - Phentermine-Topiramate (QSYMIA) 7.5-46 MG CP24; Take 1 tablet by mouth daily.  Dispense: 30 capsule; Refill: 1

## 2017-07-07 ENCOUNTER — Telehealth: Payer: Self-pay | Admitting: Family Medicine

## 2017-07-07 ENCOUNTER — Other Ambulatory Visit: Payer: Self-pay | Admitting: Family Medicine

## 2017-07-07 DIAGNOSIS — E038 Other specified hypothyroidism: Secondary | ICD-10-CM

## 2017-07-07 NOTE — Telephone Encounter (Signed)
Pt is requesting to change synthroid to generic levothyroxin (its to expensive) asking that you send to [facility] (it will be less expensive there). Pt took last pill last night. [phone]

## 2017-07-07 NOTE — Telephone Encounter (Signed)
Patient requesting refill of Synthroid to [facility].

## 2017-07-08 ENCOUNTER — Other Ambulatory Visit: Payer: Self-pay | Admitting: Family Medicine

## 2017-07-08 DIAGNOSIS — E038 Other specified hypothyroidism: Secondary | ICD-10-CM

## 2017-07-08 MED ORDER — LEVOTHYROXINE SODIUM 75 MCG PO TABS: 75.0000 ug | ORAL_TABLET | Freq: Every day | ORAL | 1 refills | Status: DC

## 2017-07-10 ENCOUNTER — Other Ambulatory Visit: Payer: Self-pay | Admitting: Family Medicine

## 2017-07-10 DIAGNOSIS — E038 Other specified hypothyroidism: Secondary | ICD-10-CM

## 2017-07-10 NOTE — Telephone Encounter (Signed)
Patient requesting refill of Levothyroxine to [facility].

## 2017-09-18 ENCOUNTER — Encounter: Payer: Self-pay | Admitting: Family Medicine

## 2017-09-18 ENCOUNTER — Other Ambulatory Visit: Payer: Self-pay | Admitting: Family Medicine

## 2017-09-18 ENCOUNTER — Ambulatory Visit (INDEPENDENT_AMBULATORY_CARE_PROVIDER_SITE_OTHER): Payer: 59 | Admitting: Family Medicine

## 2017-09-18 VITALS — BP 126/82 | HR 59 | Temp 98.2°F | Resp 18 | Ht 70.0 in | Wt 208.9 lb

## 2017-09-18 DIAGNOSIS — Z13220 Encounter for screening for lipoid disorders: Secondary | ICD-10-CM | POA: Diagnosis not present

## 2017-09-18 DIAGNOSIS — E038 Other specified hypothyroidism: Secondary | ICD-10-CM | POA: Diagnosis not present

## 2017-09-18 DIAGNOSIS — E538 Deficiency of other specified B group vitamins: Secondary | ICD-10-CM | POA: Diagnosis not present

## 2017-09-18 DIAGNOSIS — Z0000 Encounter for general adult medical examination without abnormal findings: Secondary | ICD-10-CM

## 2017-09-18 DIAGNOSIS — E663 Overweight: Secondary | ICD-10-CM | POA: Insufficient documentation

## 2017-09-18 NOTE — Patient Instructions (Addendum)
Preventive Care 32-39 Years, Male Preventive care refers to lifestyle choices and visits with your health care provider that can promote health and wellness. What does preventive care include?  A yearly physical exam. This is also called an annual well check.  Dental exams once or twice a year.  Routine eye exams. Ask your health care provider how often you should have your eyes checked.  Personal lifestyle choices, including: ? Daily care of your teeth and gums. ? Regular physical activity. ? Eating a healthy diet. ? Avoiding tobacco and drug use. ? Limiting alcohol use. ? Practicing safe sex. What happens during an annual well check? The services and screenings done by your health care provider during your annual well check will depend on your age, overall health, lifestyle risk factors, and family history of disease. Counseling Your health care provider may ask you questions about your:  Alcohol use.  Tobacco use.  Drug use.  Emotional well-being.  Home and relationship well-being.  Sexual activity.  Eating habits.  Work and work environment.  Screening You may have the following tests or measurements:  Height, weight, and BMI.  Blood pressure.  Lipid and cholesterol levels. These may be checked every 5 years starting at age 20.  Diabetes screening. This is done by checking your blood sugar (glucose) after you have not eaten for a while (fasting).  Skin check.  Hepatitis C blood test.  Hepatitis B blood test.  Sexually transmitted disease (STD) testing.  Discuss your test results, treatment options, and if necessary, the need for more tests with your health care provider. Vaccines Your health care provider may recommend certain vaccines, such as:  Influenza vaccine. This is recommended every year.  Tetanus, diphtheria, and acellular pertussis (Tdap, Td) vaccine. You may need a Td booster every 10 years.  Varicella vaccine. You may need this if you  have not been vaccinated.  HPV vaccine. If you are 32 or younger, you may need three doses over 6 months.  Measles, mumps, and rubella (MMR) vaccine. You may need at least one dose of MMR.You may also need a second dose.  Pneumococcal 13-valent conjugate (PCV13) vaccine. You may need this if you have certain conditions and have not been vaccinated.  Pneumococcal polysaccharide (PPSV23) vaccine. You may need one or two doses if you smoke cigarettes or if you have certain conditions.  Meningococcal vaccine. One dose is recommended if you are age 32-21 years years and a first-year college student living in a residence hall, or if you have one of several medical conditions. You may also need additional booster doses.  Hepatitis A vaccine. You may need this if you have certain conditions or if you travel or work in places where you may be exposed to hepatitis A.  Hepatitis B vaccine. You may need this if you have certain conditions or if you travel or work in places where you may be exposed to hepatitis B.  Haemophilus influenzae type b (Hib) vaccine. You may need this if you have certain risk factors.  Talk to your health care provider about which screenings and vaccines you need and how often you need them. This information is not intended to replace advice given to you by your health care provider. Make sure you discuss any questions you have with your health care provider. Document Released: 02/10/2002 Document Revised: 09/03/2016 Document Reviewed: 10/16/2015 Elsevier Interactive Patient Education  2017 [employer]  

## 2017-09-18 NOTE — Progress Notes (Signed)
Name: Oscar Allen   MRN: [id number]    DOB: [birth year]   Date:09/18/2017       Progress Note  Subjective  Chief Complaint  Chief Complaint  Patient presents with  . Annual Exam    HPI  Male exam: he is here today for a CPE, he states he feels tired ( he is a light sleeper), he would like to have some labs done. His brother has sleep apnea ( he does not want to have a sleep study because of the cost). He is exercising more, eating better, joined weight watchers. No longer obese/but overweight. He denies ED or lack of libido.    Patient Active Problem List   Diagnosis Date Noted  . Overweight (BMI 25.0-29.9) 09/18/2017  . Bone spur of left ankle 03/28/2016  . Insomnia, persistent 08/26/2015  . History of hypertension 08/26/2015  . Adult hypothyroidism 08/26/2015  . B12 deficiency 08/26/2015  . Gout of big toe 08/26/2015  . Vitamin D deficiency 08/26/2015    Past Surgical History:  Procedure Laterality Date  . EYE SURGERY Bilateral   . FOOT SURGERY Right   . MANDIBLE FRACTURE SURGERY    . RHINOPLASTY      Family History  Problem Relation Age of Onset  . Anxiety disorder Mother   . Hypothyroidism Father   . Heart Problems Brother     Social History   Social History  . Marital status: Single    Spouse name: N/A  . Number of children: N/A  . Years of education: N/A   Occupational History  . Not on file.   Social History Main Topics  . Smoking status: Never Smoker  . Smokeless tobacco: Never Used  . Alcohol use 0.0 oz/week     Comment: OCCASIONALLY  . Drug use: No  . Sexual activity: Yes    Partners: Female   Other Topics Concern  . Not on file   Social History Narrative  . No narrative on file     Current Outpatient Prescriptions:  .  Cholecalciferol (VITAMIN D) 2000 units CAPS, Take 1 capsule by mouth daily., Disp: , Rfl:  .  colchicine 0.6 MG tablet, Take 2 tablets (1.2 mg total) by mouth daily as needed (and repat in one hour if no  resolution of pain)., Disp: 30 tablet, Rfl: 0 .  levothyroxine (SYNTHROID) 75 MCG tablet, Take 1 tablet (75 mcg total) by mouth daily., Disp: 90 tablet, Rfl: 1 .  Methylcobalamin 1000 MCG SUBL, Place 1 tablet under the tongue daily., Disp: 30 tablet, Rfl: 0 .  naproxen (NAPROSYN) 500 MG tablet, Take 1 tablet (500 mg total) by mouth 2 (two) times daily with a meal., Disp: 60 tablet, Rfl: 0 .  Probiotic Product (ALIGN PO), Take 1 capsule by mouth daily., Disp: , Rfl:   Allergies  Allergen Reactions  . Rhus Coriaria [Sumac] Hives     ROS  Constitutional: Negative for fever, positive for  weight change.  Respiratory: Negative for cough and shortness of breath.   Cardiovascular: Negative for chest pain or palpitations.  Gastrointestinal: Negative for abdominal pain, no bowel changes.  Musculoskeletal: Negative for gait problem or joint swelling.  Skin: Negative for rash.  Neurological: Negative for dizziness or headache.  No other specific complaints in a complete review of systems (except as listed in HPI above).  Objective  Vitals:   09/18/17 1404  BP: 126/82  Pulse: (!) 59  Resp: 18  Temp: 98.2 F (36.8 C)  TempSrc: Oral  SpO2: 98%  Weight: 208 lb 14.4 oz (94.8 kg)  Height: 5\' 10"  (1.778 m)    Body mass index is 29.97 kg/m.  Physical Exam  Constitutional: Patient appears well-developed and well-nourished, overweight. No distress.  HENT: Head: Normocephalic and atraumatic. Ears: B TMs ok, no erythema or effusion; Nose: Nose normal. Mouth/Throat: Oropharynx is clear and moist. No oropharyngeal exudate.  Eyes: Conjunctivae and EOM are normal. Pupils are equal, round, and reactive to light. No scleral icterus.  Neck: Normal range of motion. Neck supple. No JVD present. No thyromegaly present.  Cardiovascular: Normal rate, regular rhythm and normal heart sounds.  No murmur heard. No BLE edema. Pulmonary/Chest: Effort normal and breath sounds normal. No respiratory  distress. Abdominal: Soft. Bowel sounds are normal, no distension. There is no tenderness. no masses MALE GENITALIA: Normal descended testes bilaterally, no masses palpated, no hernias, no lesions, no discharge RECTAL:  Not done Musculoskeletal: Normal range of motion, no joint effusions. No gross deformities Neurological: he is alert and oriented to person, place, and time. No cranial nerve deficit. Coordination, balance, strength, speech and gait are normal.  Skin: Skin is warm and dry. No rash noted. No erythema.  Psychiatric: Patient has a normal mood and affect. behavior is normal. Judgment and thought content normal.  PHQ2/9: Depression screen [facility] 2/9 09/18/2017 06/12/2017 11/24/2016 10/17/2016 07/11/2016  Decreased Interest 0 0 0 0 0  Down, Depressed, Hopeless 0 0 0 0 0  PHQ - 2 Score 0 0 0 0 0    Fall Risk: Fall Risk  09/18/2017 06/12/2017 11/24/2016 10/17/2016 07/11/2016  Falls in the past year? No No No No No    Functional Status Survey: Is the patient deaf or have difficulty hearing?: No Does the patient have difficulty seeing, even when wearing glasses/contacts?: No Does the patient have difficulty concentrating, remembering, or making decisions?: No Does the patient have difficulty walking or climbing stairs?: No Does the patient have difficulty dressing or bathing?: No Does the patient have difficulty doing errands alone such as visiting a doctor's office or shopping?: No    Assessment & Plan  1. Encounter for routine history and physical exam for male  Discussed importance of 150 minutes of physical activity weekly, eat two servings of fish weekly, eat one serving of tree nuts ( cashews, pistachios, pecans, almonds.Marland Kitchen) every other day, eat 6 servings of fruit/vegetables daily and drink plenty of water and avoid sweet beverages.  - Lipid panel - TSH - Vitamin B12 - COMPLETE METABOLIC PANEL WITH GFR - CBC with Differential/Platelet  He will have flu shot at work  2.  Other specified hypothyroidism  - TSH  3. Lipid screening  - Lipid panel  4. B12 deficiency  - Vitamin B12

## 2017-09-19 LAB — CBC WITH DIFFERENTIAL/PLATELET
BASOS ABS: 49 {cells}/uL (ref 0–200)
Basophils Relative: 0.8 %
EOS ABS: 159 {cells}/uL (ref 15–500)
Eosinophils Relative: 2.6 %
HCT: 42 % (ref 38.5–50.0)
HEMOGLOBIN: 14.2 g/dL (ref 13.2–17.1)
Lymphs Abs: 2464 cells/uL (ref 850–3900)
MCH: 27.8 pg (ref 27.0–33.0)
MCHC: 33.8 g/dL (ref 32.0–36.0)
MCV: 82.4 fL (ref 80.0–100.0)
MONOS PCT: 5.7 %
MPV: 9.7 fL (ref 7.5–12.5)
NEUTROS ABS: 3081 {cells}/uL (ref 1500–7800)
NEUTROS PCT: 50.5 %
Platelets: 236 10*3/uL (ref 140–400)
RBC: 5.1 10*6/uL (ref 4.20–5.80)
RDW: 12.5 % (ref 11.0–15.0)
TOTAL LYMPHOCYTE: 40.4 %
WBC mixed population: 348 cells/uL (ref 200–950)
WBC: 6.1 10*3/uL (ref 3.8–10.8)

## 2017-09-19 LAB — COMPLETE METABOLIC PANEL WITH GFR
AG RATIO: 2.3 (calc) (ref 1.0–2.5)
ALKALINE PHOSPHATASE (APISO): 35 U/L — AB (ref 40–115)
ALT: 36 U/L (ref 9–46)
AST: 23 U/L (ref 10–40)
Albumin: 4.8 g/dL (ref 3.6–5.1)
BILIRUBIN TOTAL: 0.7 mg/dL (ref 0.2–1.2)
BUN: 11 mg/dL (ref 7–25)
CHLORIDE: 104 mmol/L (ref 98–110)
CO2: 29 mmol/L (ref 20–32)
Calcium: 9.7 mg/dL (ref 8.6–10.3)
Creat: 0.9 mg/dL (ref 0.60–1.35)
GFR, EST AFRICAN AMERICAN: 131 mL/min/{1.73_m2} (ref >=60)
GFR, Est Non African American: 113 mL/min/{1.73_m2} (ref >=60)
Globulin: 2.1 g/dL (calc) (ref 1.9–3.7)
Glucose, Bld: 83 mg/dL (ref 65–99)
POTASSIUM: 4 mmol/L (ref 3.5–5.3)
Sodium: 142 mmol/L (ref 135–146)
TOTAL PROTEIN: 6.9 g/dL (ref 6.1–8.1)

## 2017-09-19 LAB — LIPID PANEL
Cholesterol: 200 mg/dL — ABNORMAL HIGH (ref <200)
HDL: 58 mg/dL (ref >40)
LDL CHOLESTEROL (CALC): 120 mg/dL — AB
NON-HDL CHOLESTEROL (CALC): 142 mg/dL — AB (ref <130)
TRIGLYCERIDES: 108 mg/dL (ref <150)
Total CHOL/HDL Ratio: 3.4 (calc) (ref <5.0)

## 2017-09-19 LAB — VITAMIN B12: VITAMIN B 12: 645 pg/mL (ref 200–1100)

## 2017-09-19 LAB — TSH: TSH: 2.07 m[IU]/L (ref 0.40–4.50)

## 2018-01-06 ENCOUNTER — Other Ambulatory Visit: Payer: Self-pay | Admitting: Family Medicine

## 2018-01-06 DIAGNOSIS — E038 Other specified hypothyroidism: Secondary | ICD-10-CM

## 2018-01-06 NOTE — Telephone Encounter (Signed)
Refill request for thyroid medication: Levothyroxine to [facility]  Last Physical:  Lab Results  Component Value Date   TSH 2.07 09/18/2017     Follow up on 03/19/2018

## 2018-01-16 DIAGNOSIS — M25571 Pain in right ankle and joints of right foot: Secondary | ICD-10-CM | POA: Diagnosis not present

## 2018-01-16 DIAGNOSIS — M25572 Pain in left ankle and joints of left foot: Secondary | ICD-10-CM | POA: Diagnosis not present

## 2018-01-25 DIAGNOSIS — M25572 Pain in left ankle and joints of left foot: Secondary | ICD-10-CM | POA: Diagnosis not present

## 2018-01-25 DIAGNOSIS — M79672 Pain in left foot: Secondary | ICD-10-CM | POA: Diagnosis not present

## 2018-01-25 DIAGNOSIS — M25571 Pain in right ankle and joints of right foot: Secondary | ICD-10-CM | POA: Diagnosis not present

## 2018-02-02 DIAGNOSIS — M25571 Pain in right ankle and joints of right foot: Secondary | ICD-10-CM | POA: Diagnosis not present

## 2018-02-02 DIAGNOSIS — M25572 Pain in left ankle and joints of left foot: Secondary | ICD-10-CM | POA: Diagnosis not present

## 2018-02-12 DIAGNOSIS — M25471 Effusion, right ankle: Secondary | ICD-10-CM | POA: Diagnosis not present

## 2018-02-12 DIAGNOSIS — M7670 Peroneal tendinitis, unspecified leg: Secondary | ICD-10-CM | POA: Insufficient documentation

## 2018-02-12 DIAGNOSIS — M7672 Peroneal tendinitis, left leg: Secondary | ICD-10-CM | POA: Diagnosis not present

## 2018-03-01 ENCOUNTER — Other Ambulatory Visit: Payer: Self-pay | Admitting: Family Medicine

## 2018-03-01 DIAGNOSIS — M109 Gout, unspecified: Secondary | ICD-10-CM

## 2018-03-02 ENCOUNTER — Other Ambulatory Visit: Payer: Self-pay | Admitting: Family Medicine

## 2018-03-02 DIAGNOSIS — M109 Gout, unspecified: Secondary | ICD-10-CM

## 2018-03-02 MED ORDER — COLCHICINE 0.6 MG PO TABS: 1.2000 mg | ORAL_TABLET | Freq: Every day | ORAL | 0 refills | Status: DC | PRN

## 2018-03-02 NOTE — Telephone Encounter (Signed)
Refill request for general medication: Colchicine 0.6 mg  Last office visit: 09/18/2017  Last physical exam: 09/18/2017  Follow-up on file. 03/19/2018

## 2018-03-02 NOTE — Telephone Encounter (Signed)
Copied from [facility]. Topic: Quick Communication - Rx Refill/Question >> Mar 02, 2018 12:16 PM Cecelia Byars, NT wrote: Medication:  colchicine 0.6 MG tablet  Has the patient contacted their pharmacy?  (Agent: If no, request that the patient contact the pharmacy for the refill. Preferred Pharmacy (with phone number or street name):[facility]  [address]  [phone] fax [phone] Agent: Please be advised that RX refills may take up to 3 business days. We ask that you follow-up with your pharmacy. Patient did not want to contact the pharmacy and requested that I request refill , this is a different pharmacy than the one previously used

## 2018-03-02 NOTE — Telephone Encounter (Signed)
Refill request for general medication: Colchinie 0.6 mg  Last office visit: 09/18/2017  Last physical exam: 09/18/2017  Follow-up on file. 03/19/2018

## 2018-03-03 MED FILL — COLCHICINE 0.6 MG TABS: 0.6 | 30 days supply | Qty: 30 | Fill #0

## 2018-03-19 ENCOUNTER — Ambulatory Visit (INDEPENDENT_AMBULATORY_CARE_PROVIDER_SITE_OTHER): Payer: 59 | Admitting: Family Medicine

## 2018-03-19 ENCOUNTER — Encounter: Payer: Self-pay | Admitting: Family Medicine

## 2018-03-19 VITALS — BP 120/90 | HR 64 | Temp 98.3°F | Resp 16 | Ht 70.0 in | Wt 213.7 lb

## 2018-03-19 DIAGNOSIS — R0683 Snoring: Secondary | ICD-10-CM

## 2018-03-19 DIAGNOSIS — E669 Obesity, unspecified: Secondary | ICD-10-CM | POA: Diagnosis not present

## 2018-03-19 DIAGNOSIS — M25579 Pain in unspecified ankle and joints of unspecified foot: Secondary | ICD-10-CM

## 2018-03-19 DIAGNOSIS — R5383 Other fatigue: Secondary | ICD-10-CM

## 2018-03-19 DIAGNOSIS — Z8739 Personal history of other diseases of the musculoskeletal system and connective tissue: Secondary | ICD-10-CM | POA: Diagnosis not present

## 2018-03-19 DIAGNOSIS — E038 Other specified hypothyroidism: Secondary | ICD-10-CM

## 2018-03-19 DIAGNOSIS — G8929 Other chronic pain: Secondary | ICD-10-CM | POA: Diagnosis not present

## 2018-03-19 DIAGNOSIS — E538 Deficiency of other specified B group vitamins: Secondary | ICD-10-CM

## 2018-03-19 DIAGNOSIS — E790 Hyperuricemia without signs of inflammatory arthritis and tophaceous disease: Secondary | ICD-10-CM

## 2018-03-19 MED ORDER — TRAMADOL HCL 50 MG PO TABS
50.0000 mg | ORAL_TABLET | Freq: Three times a day (TID) | ORAL | 0 refills | Status: DC | PRN
Start: 2018-03-19 — End: 2019-05-13

## 2018-03-19 MED ORDER — PHENTERMINE-TOPIRAMATE ER 3.75-23 MG PO CP24: 14.0000 | ORAL_CAPSULE | Freq: Every day | ORAL | 0 refills | Status: DC

## 2018-03-19 MED ORDER — PHENTERMINE-TOPIRAMATE ER 7.5-46 MG PO CP24: 1.0000 | ORAL_CAPSULE | Freq: Every day | ORAL | 0 refills | Status: DC

## 2018-03-19 NOTE — Progress Notes (Signed)
Name: Oscar Allen   MRN: [id number]    DOB: 1986-08-11   Date:03/19/2018       Progress Note  Subjective  Chief Complaint  Chief Complaint  Patient presents with  . Medication Refill    6 month F/U  . Hypothyroidism    Fatigue  . Joint Swelling    Onset-January, Right ankle constantly has been bothering him-worst with weight bearing activites. Had a MRI of both ankles- Left ankle had a split in tendon. Is in constant pain    HPI  Ankle pain and swelling: he states he had to go to [facility] since last year for worsening of left ankle swelling and pain ( going on intermittent for the past couple of years) but persistent right ankle pain, constant for the past 2 months. He had multiple labs - including sed rate, ANA, RF panels, and the only positive was elevated uric acid. It is affecting his ability to exercise, he has chronic swelling on right ankle and is tired of taking nsaid's without significant help. X-rays were negative, and he states he had an MRI but I was unable to see it on epic. They suggested referral to rheumatologist and I will place referral today. He also asked for some pain medication to relieve his pain and I will give him 15 days of Tramadol  Hypothyroidism: he has noticed more fatigue and some weight gain over the past couple of months, discussed recheck TSH but he would like to hold off  Snoring/interrupted sleep: he had a high ESS - 14- in our office, also states father and younger brother have sleep apnea. Neck circumference of 16 inches  Obesity: he tried Contrave, but did not noticed any change in his appetite, also tool Belviq for the past 6 weeks and he gained weight.  He is has not been going to the gym because of ankle pain. He states since he started on Qsymia 11/2016, initial weight of 251 lbs and he went as low as 198 lbs,  total loss of over 50 lbs  He has been off mediation because of cost, but is willing to try re-starting it.    Patient  Active Problem List   Diagnosis Date Noted  . Overweight (BMI 25.0-29.9) 09/18/2017  . Bone spur of left ankle 03/28/2016  . Insomnia, persistent 08/26/2015  . History of hypertension 08/26/2015  . Adult hypothyroidism 08/26/2015  . B12 deficiency 08/26/2015  . Gout of big toe 08/26/2015  . Vitamin D deficiency 08/26/2015    Past Surgical History:  Procedure Laterality Date  . EYE SURGERY Bilateral   . FOOT SURGERY Right   . MANDIBLE FRACTURE SURGERY    . RHINOPLASTY      Family History  Problem Relation Age of Onset  . Anxiety disorder Mother   . Hypothyroidism Father   . Heart Problems Brother     Social History   Socioeconomic History  . Marital status: Married    Spouse name: Jinny Blossom  . Number of children: 0  . Years of education: Not on file  . Highest education level: Doctorate  Occupational History  . Not on file  Social Needs  . Financial resource strain: Not on file  . Food insecurity:    Worry: Not on file    Inability: Not on file  . Transportation needs:    Medical: Not on file    Non-medical: Not on file  Tobacco Use  . Smoking status: Never Smoker  . Smokeless tobacco:  Never Used  Substance and Sexual Activity  . Alcohol use: Yes    Alcohol/week: 0.0 oz    Comment: OCCASIONALLY  . Drug use: No  . Sexual activity: Yes    Partners: Female  Lifestyle  . Physical activity:    Days per week: Not on file    Minutes per session: Not on file  . Stress: Not on file  Relationships  . Social connections:    Talks on phone: Not on file    Gets together: Not on file    Attends religious service: Not on file    Active member of club or organization: Not on file    Attends meetings of clubs or organizations: Not on file    Relationship status: Not on file  . Intimate partner violence:    Fear of current or ex partner: Not on file    Emotionally abused: Not on file    Physically abused: Not on file    Forced sexual activity: Not on file  Other  Topics Concern  . Not on file  Social History Narrative  . Not on file     Current Outpatient Medications:  .  Acetaminophen (TYLENOL) 325 MG CAPS, Take 2 capsules by mouth as needed., Disp: , Rfl:  .  Cholecalciferol (VITAMIN D) 2000 units CAPS, Take 1 capsule by mouth daily., Disp: , Rfl:  .  colchicine 0.6 MG tablet, Take 2 tablets (1.2 mg total) by mouth daily as needed (and repat in one hour if no resolution of pain)., Disp: 30 tablet, Rfl: 0 .  levothyroxine (SYNTHROID, LEVOTHROID) 75 MCG tablet, TAKE 1 TABLET BY MOUTH EVERY DAY, Disp: 90 tablet, Rfl: 0 .  Methylcobalamin 1000 MCG SUBL, Place 1 tablet under the tongue daily. (Patient not taking: Reported on 03/19/2018), Disp: 30 tablet, Rfl: 0 .  Probiotic Product (ALIGN PO), Take 1 capsule by mouth daily., Disp: , Rfl:   Allergies  Allergen Reactions  . Rhus Coriaria [Sumac] Hives     ROS  Constitutional: Negative for fever , positive for mild weight change.  Respiratory: Negative for cough and shortness of breath.   Cardiovascular: Negative for chest pain or palpitations.  Gastrointestinal: Negative for abdominal pain, no bowel changes.  Musculoskeletal: Negative for gait problem, positive for intermittent ankle  joint swelling.  Skin: Negative for rash.  Neurological: Negative for dizziness or headache.  No other specific complaints in a complete review of systems (except as listed in HPI above).  Objective  Vitals:   03/19/18 1421  BP: 120/90  Pulse: 64  Resp: 16  Temp: 98.3 F (36.8 C)  TempSrc: Oral  SpO2: 97%  Weight: 213 lb 11.2 oz (96.9 kg)  Height: 5\' 10"  (1.778 m)    Body mass index is 30.66 kg/m.  Physical Exam  Constitutional: Patient appears well-developed and well-nourished. Obese No distress.  HEENT: head atraumatic, normocephalic, pupils equal and reactive to light, neck supple, throat within normal limits Cardiovascular: Normal rate, regular rhythm and normal heart sounds.  No murmur heard.  No BLE edema. Pulmonary/Chest: Effort normal and breath sounds normal. No respiratory distress. Abdominal: Soft.  There is no tenderness. Psychiatric: Patient has a normal mood and affect. behavior is normal. Judgment and thought content normal.  PHQ2/9: Depression screen [facility] 2/9 09/18/2017 06/12/2017 11/24/2016 10/17/2016 07/11/2016  Decreased Interest 0 0 0 0 0  Down, Depressed, Hopeless 0 0 0 0 0  PHQ - 2 Score 0 0 0 0 0     Fall  Risk: Fall Risk  09/18/2017 06/12/2017 11/24/2016 10/17/2016 07/11/2016  Falls in the past year? No No No No No     Assessment & Plan  1. Other specified hypothyroidism  He prefers not getting TSH today  2. B12 deficiency  Continue supplementation   3. Obesity (BMI 30.0-34.9)  He would like to resume medication  - Phentermine-Topiramate (QSYMIA) 3.75-23 MG CP24; Take 14 capsules by mouth daily.  Dispense: 14 capsule; Refill: 0 - Phentermine-Topiramate (QSYMIA) 7.5-46 MG CP24; Take 1 capsule by mouth daily.  Dispense: 30 capsule; Refill: 0  4. History of gout  Recent uric acid done at [facility]   5. Elevated blood uric acid level  - Ambulatory referral to Rheumatology  6. Chronic ankle pain, unspecified laterality  - Ambulatory referral to Rheumatology - traMADol (ULTRAM) 50 MG tablet; Take 1 tablet (50 mg total) by mouth every 8 (eight) hours as needed.  Dispense: 15 tablet; Refill: 0  7. Other fatigue  - Ambulatory referral to Sleep Studies  8. Snoring  - Ambulatory referral to Sleep Studies

## 2018-03-26 DIAGNOSIS — M25471 Effusion, right ankle: Secondary | ICD-10-CM | POA: Diagnosis not present

## 2018-03-26 DIAGNOSIS — M1009 Idiopathic gout, multiple sites: Secondary | ICD-10-CM | POA: Diagnosis not present

## 2018-03-31 ENCOUNTER — Encounter: Payer: Self-pay | Admitting: Family Medicine

## 2018-04-01 ENCOUNTER — Encounter: Payer: Self-pay | Admitting: Family Medicine

## 2018-04-04 ENCOUNTER — Other Ambulatory Visit: Payer: Self-pay | Admitting: Family Medicine

## 2018-04-04 DIAGNOSIS — E038 Other specified hypothyroidism: Secondary | ICD-10-CM

## 2018-04-07 DIAGNOSIS — M1990 Unspecified osteoarthritis, unspecified site: Secondary | ICD-10-CM | POA: Diagnosis not present

## 2018-04-07 DIAGNOSIS — M25472 Effusion, left ankle: Secondary | ICD-10-CM | POA: Diagnosis not present

## 2018-04-07 DIAGNOSIS — M2550 Pain in unspecified joint: Secondary | ICD-10-CM | POA: Diagnosis not present

## 2018-04-07 DIAGNOSIS — M25571 Pain in right ankle and joints of right foot: Secondary | ICD-10-CM | POA: Diagnosis not present

## 2018-04-07 DIAGNOSIS — D869 Sarcoidosis, unspecified: Secondary | ICD-10-CM | POA: Diagnosis not present

## 2018-04-07 DIAGNOSIS — M25572 Pain in left ankle and joints of left foot: Secondary | ICD-10-CM | POA: Diagnosis not present

## 2018-04-07 DIAGNOSIS — M109 Gout, unspecified: Secondary | ICD-10-CM | POA: Diagnosis not present

## 2018-04-07 DIAGNOSIS — M76821 Posterior tibial tendinitis, right leg: Secondary | ICD-10-CM | POA: Diagnosis not present

## 2018-04-07 DIAGNOSIS — M7672 Peroneal tendinitis, left leg: Secondary | ICD-10-CM | POA: Diagnosis not present

## 2018-04-07 DIAGNOSIS — M25471 Effusion, right ankle: Secondary | ICD-10-CM | POA: Diagnosis not present

## 2018-04-07 DIAGNOSIS — E790 Hyperuricemia without signs of inflammatory arthritis and tophaceous disease: Secondary | ICD-10-CM | POA: Diagnosis not present

## 2018-04-09 ENCOUNTER — Ambulatory Visit: Payer: 59 | Attending: Neurology

## 2018-04-09 DIAGNOSIS — Z09 Encounter for follow-up examination after completed treatment for conditions other than malignant neoplasm: Secondary | ICD-10-CM | POA: Diagnosis not present

## 2018-04-09 DIAGNOSIS — Z8669 Personal history of other diseases of the nervous system and sense organs: Secondary | ICD-10-CM | POA: Insufficient documentation

## 2018-04-09 DIAGNOSIS — G4733 Obstructive sleep apnea (adult) (pediatric): Secondary | ICD-10-CM | POA: Diagnosis present

## 2018-04-13 ENCOUNTER — Encounter: Payer: Self-pay | Admitting: Family Medicine

## 2018-05-07 DIAGNOSIS — M109 Gout, unspecified: Secondary | ICD-10-CM | POA: Diagnosis not present

## 2018-05-07 DIAGNOSIS — M25571 Pain in right ankle and joints of right foot: Secondary | ICD-10-CM | POA: Diagnosis not present

## 2018-05-07 DIAGNOSIS — M25472 Effusion, left ankle: Secondary | ICD-10-CM | POA: Diagnosis not present

## 2018-05-07 DIAGNOSIS — M1990 Unspecified osteoarthritis, unspecified site: Secondary | ICD-10-CM | POA: Diagnosis not present

## 2018-05-07 DIAGNOSIS — E790 Hyperuricemia without signs of inflammatory arthritis and tophaceous disease: Secondary | ICD-10-CM | POA: Diagnosis not present

## 2018-05-07 DIAGNOSIS — M25471 Effusion, right ankle: Secondary | ICD-10-CM | POA: Diagnosis not present

## 2018-05-18 DIAGNOSIS — H43393 Other vitreous opacities, bilateral: Secondary | ICD-10-CM | POA: Diagnosis not present

## 2018-05-18 DIAGNOSIS — H16423 Pannus (corneal), bilateral: Secondary | ICD-10-CM | POA: Diagnosis not present

## 2018-05-21 DIAGNOSIS — H04123 Dry eye syndrome of bilateral lacrimal glands: Secondary | ICD-10-CM | POA: Insufficient documentation

## 2018-05-21 DIAGNOSIS — H43393 Other vitreous opacities, bilateral: Secondary | ICD-10-CM | POA: Insufficient documentation

## 2018-05-25 ENCOUNTER — Encounter: Payer: Self-pay | Admitting: Family Medicine

## 2018-06-18 ENCOUNTER — Other Ambulatory Visit: Payer: Self-pay | Admitting: Family Medicine

## 2018-06-18 DIAGNOSIS — E669 Obesity, unspecified: Secondary | ICD-10-CM

## 2018-06-18 NOTE — Telephone Encounter (Signed)
Refill request for general medication. Qsymia   Last office visit 03/19/2018   Follow up on 07/09/18

## 2018-06-25 DIAGNOSIS — M25571 Pain in right ankle and joints of right foot: Secondary | ICD-10-CM | POA: Diagnosis not present

## 2018-06-25 DIAGNOSIS — M1990 Unspecified osteoarthritis, unspecified site: Secondary | ICD-10-CM | POA: Diagnosis not present

## 2018-06-25 DIAGNOSIS — M25471 Effusion, right ankle: Secondary | ICD-10-CM | POA: Diagnosis not present

## 2018-06-25 DIAGNOSIS — M79671 Pain in right foot: Secondary | ICD-10-CM | POA: Diagnosis not present

## 2018-06-25 DIAGNOSIS — M109 Gout, unspecified: Secondary | ICD-10-CM | POA: Diagnosis not present

## 2018-06-25 DIAGNOSIS — E790 Hyperuricemia without signs of inflammatory arthritis and tophaceous disease: Secondary | ICD-10-CM | POA: Diagnosis not present

## 2018-06-25 DIAGNOSIS — R5383 Other fatigue: Secondary | ICD-10-CM | POA: Diagnosis not present

## 2018-07-09 ENCOUNTER — Encounter: Payer: Self-pay | Admitting: Family Medicine

## 2018-07-09 ENCOUNTER — Ambulatory Visit (INDEPENDENT_AMBULATORY_CARE_PROVIDER_SITE_OTHER): Payer: 59 | Admitting: Family Medicine

## 2018-07-09 VITALS — BP 118/72 | HR 74 | Temp 98.3°F | Resp 16 | Ht 70.0 in | Wt 206.3 lb

## 2018-07-09 DIAGNOSIS — Z8639 Personal history of other endocrine, nutritional and metabolic disease: Secondary | ICD-10-CM

## 2018-07-09 DIAGNOSIS — E039 Hypothyroidism, unspecified: Secondary | ICD-10-CM | POA: Diagnosis not present

## 2018-07-09 DIAGNOSIS — E559 Vitamin D deficiency, unspecified: Secondary | ICD-10-CM

## 2018-07-09 DIAGNOSIS — E538 Deficiency of other specified B group vitamins: Secondary | ICD-10-CM | POA: Diagnosis not present

## 2018-07-09 DIAGNOSIS — M1A0710 Idiopathic chronic gout, right ankle and foot, without tophus (tophi): Secondary | ICD-10-CM | POA: Diagnosis not present

## 2018-07-09 MED ORDER — VITAMIN D (ERGOCALCIFEROL) 1.25 MG (50000 UNIT) PO CAPS: 50000.0000 [IU] | ORAL_CAPSULE | ORAL | 0 refills | Status: DC

## 2018-07-09 MED ORDER — PHENTERMINE-TOPIRAMATE ER 7.5-46 MG PO CP24: 1.0000 | ORAL_CAPSULE | Freq: Every day | ORAL | 2 refills | Status: DC

## 2018-07-09 MED ORDER — LEVOTHYROXINE SODIUM 75 MCG PO TABS: 75.0000 ug | ORAL_TABLET | Freq: Every day | ORAL | 1 refills | Status: DC

## 2018-07-09 NOTE — Progress Notes (Signed)
Name: Oscar Allen   MRN: [id number]    DOB: 1986-05-09   Date:07/09/2018       Progress Note  Subjective  Chief Complaint  Chief Complaint  Patient presents with  . Follow-up    patient is here for a 3 month f/u  . Joint Pain  . Obesity  . Snoring  . Hypothyroidism  . Medication Refill    HPI  Chronic right ankle pain: it was present for months, seen by Ortho and had MRI that was unremarkable, finally saw Rheumatologist and was started on allopurinol bid and colchine daily about 3 months ago and pain and swelling has resolved. He is feeling well and will start running again.   Hypothyroidism: he continues to ha ve fatigue, but weight is under control at this time.  TSH done at Rheumatologist office on 06/25/2018 and it was 1.630   Snoring/interrupted sleep: he had a high ESS - 14- in our office, also states father and younger brother have sleep apnea. Neck circumference of 16 inches, he has a normal sleep study, continues to feel tired, still working out, only abnormal level was vitamin D we will give him a rx since he is currently taking otc supplementation   Obesity: he tried Contrave, but did not noticed any change in his appetite, also tool Belviq for the past 6 weeks and he gained weight.  He states since he started on Qsymia 11/2016, initial weight of 251 lbs and he went as low as 198 lbs,total loss of over50 lbs He has been off medication and resumed on March 2019 at weight of 213 lbs, and is down 7 lbs since. Still going to the gym and following a weight watchers app   Patient Active Problem List   Diagnosis Date Noted  . Vitreous floater, bilateral 05/21/2018  . Dry eye syndrome of both lacrimal glands 05/21/2018  . Peroneal tendinitis 02/12/2018  . Overweight (BMI 25.0-29.9) 09/18/2017  . Bone spur of left ankle 03/28/2016  . Insomnia, persistent 08/26/2015  . History of hypertension 08/26/2015  . Adult hypothyroidism 08/26/2015  . B12 deficiency  08/26/2015  . Gout of big toe 08/26/2015  . Vitamin D deficiency 08/26/2015    Past Surgical History:  Procedure Laterality Date  . EYE SURGERY Bilateral   . FOOT SURGERY Right   . MANDIBLE FRACTURE SURGERY    . RHINOPLASTY      Family History  Problem Relation Age of Onset  . Anxiety disorder Mother   . Hypothyroidism Father   . Heart Problems Brother     Social History   Socioeconomic History  . Marital status: Married    Spouse name: Jinny Blossom  . Number of children: 0  . Years of education: Not on file  . Highest education level: Doctorate  Occupational History  . Not on file  Social Needs  . Financial resource strain: Not hard at all  . Food insecurity:    Worry: Never true    Inability: Never true  . Transportation needs:    Medical: No    Non-medical: No  Tobacco Use  . Smoking status: Never Smoker  . Smokeless tobacco: Never Used  Substance and Sexual Activity  . Alcohol use: Yes    Alcohol/week: 0.0 oz    Comment: OCCASIONALLY  . Drug use: No  . Sexual activity: Yes    Partners: Female  Lifestyle  . Physical activity:    Days per week: 7 days    Minutes per session: 90  min  . Stress: Only a little  Relationships  . Social connections:    Talks on phone: More than three times a week    Gets together: Once a week    Attends religious service: 1 to 4 times per year    Active member of club or organization: Yes    Attends meetings of clubs or organizations: More than 4 times per year    Relationship status: Married  . Intimate partner violence:    Fear of current or ex partner: No    Emotionally abused: No    Physically abused: No    Forced sexual activity: No  Other Topics Concern  . Not on file  Social History Narrative  . Not on file     Current Outpatient Medications:  .  Acetaminophen (TYLENOL) 325 MG CAPS, Take 2 capsules by mouth as needed., Disp: , Rfl:  .  Cholecalciferol (VITAMIN D) 2000 units CAPS, Take 1 capsule by mouth daily.,  Disp: , Rfl:  .  colchicine 0.6 MG tablet, Take 2 tablets (1.2 mg total) by mouth daily as needed (and repat in one hour if no resolution of pain)., Disp: 30 tablet, Rfl: 0 .  levothyroxine (SYNTHROID, LEVOTHROID) 75 MCG tablet, TAKE 1 TABLET BY MOUTH EVERY DAY, Disp: 90 tablet, Rfl: 0 .  QSYMIA 7.5-46 MG CP24, TAKE 1 CAPSULE BY MOUTH DAILY., Disp: 30 capsule, Rfl: 0 .  traMADol (ULTRAM) 50 MG tablet, Take 1 tablet (50 mg total) by mouth every 8 (eight) hours as needed., Disp: 15 tablet, Rfl: 0 .  Methylcobalamin 1000 MCG SUBL, Place 1 tablet under the tongue daily. (Patient not taking: Reported on 03/19/2018), Disp: 30 tablet, Rfl: 0 .  Probiotic Product (ALIGN PO), Take 1 capsule by mouth daily., Disp: , Rfl:   Allergies  Allergen Reactions  . Rhus Coriaria [Sumac] Hives     ROS  Constitutional: Negative for fever, positive for weight change.  Respiratory: Negative for cough and shortness of breath.   Cardiovascular: Negative for chest pain or palpitations.  Gastrointestinal: Negative for abdominal pain, no bowel changes.  Musculoskeletal: Negative for gait problem or joint swelling.  Skin: Negative for rash.  Neurological: Negative for dizziness or headache.  No other specific complaints in a complete review of systems (except as listed in HPI above).  Objective  Vitals:   07/09/18 1457  Pulse: 74  Resp: 16  Temp: 98.3 F (36.8 C)  TempSrc: Oral  SpO2: 99%  Weight: 206 lb 4.8 oz (93.6 kg)  Height: 5\' 10"  (1.778 m)    Body mass index is 29.6 kg/m.  Physical Exam  Constitutional: Patient appears well-developed and well-nourished. Overweight  No distress.  HEENT: head atraumatic, normocephalic, pupils equal and reactive to light, neck supple, throat within normal limits Cardiovascular: Normal rate, regular rhythm and normal heart sounds.  No murmur heard. No BLE edema. Pulmonary/Chest: Effort normal and breath sounds normal. No respiratory distress. Abdominal: Soft.   There is no tenderness. Psychiatric: Patient has a normal mood and affect. behavior is normal. Judgment and thought content normal.  PHQ2/9: Depression screen [facility] 2/9 07/09/2018 09/18/2017 06/12/2017 11/24/2016 10/17/2016  Decreased Interest 0 0 0 0 0  Down, Depressed, Hopeless 0 0 0 0 0  PHQ - 2 Score 0 0 0 0 0     Fall Risk: Fall Risk  07/09/2018 09/18/2017 06/12/2017 11/24/2016 10/17/2016  Falls in the past year? No No No No No    Functional Status Survey: Is the patient  deaf or have difficulty hearing?: No Does the patient have difficulty seeing, even when wearing glasses/contacts?: No Does the patient have difficulty concentrating, remembering, or making decisions?: No Does the patient have difficulty walking or climbing stairs?: No Does the patient have difficulty dressing or bathing?: No Does the patient have difficulty doing errands alone such as visiting a doctor's office or shopping?: No   Assessment & Plan  1. Adult hypothyroidism  - levothyroxine (SYNTHROID, LEVOTHROID) 75 MCG tablet; Take 1 tablet (75 mcg total) by mouth daily.  Dispense: 90 tablet; Refill: 1 TSH done by Rheumatologist was normal last month   2. B12 deficiency  Resume supplementation a few times a week   3. History of obesity   - Phentermine-Topiramate (QSYMIA) 7.5-46 MG CP24; Take 1 capsule by mouth daily.  Dispense: 30 capsule; Refill: 2  4. Chronic gout of right ankle  Seeing Rheumatologist and is taking Allopurinol and uric acid is down to 5.5 and pain has resolved  5. Vitamin D deficiency  - Vitamin D, Ergocalciferol, (DRISDOL) 50000 units CAPS capsule; Take 1 capsule (50,000 Units total) by mouth every 7 (seven) days.  Dispense: 12 capsule; Refill: 0

## 2018-07-15 ENCOUNTER — Encounter: Payer: Self-pay | Admitting: Family Medicine

## 2018-07-28 ENCOUNTER — Encounter: Payer: Self-pay | Admitting: Family Medicine

## 2018-07-28 DIAGNOSIS — E790 Hyperuricemia without signs of inflammatory arthritis and tophaceous disease: Secondary | ICD-10-CM | POA: Insufficient documentation

## 2018-07-28 DIAGNOSIS — Z8261 Family history of arthritis: Secondary | ICD-10-CM | POA: Insufficient documentation

## 2018-09-20 ENCOUNTER — Encounter: Payer: Self-pay | Admitting: Family Medicine

## 2018-09-24 ENCOUNTER — Ambulatory Visit (INDEPENDENT_AMBULATORY_CARE_PROVIDER_SITE_OTHER): Payer: 59 | Admitting: Family Medicine

## 2018-09-24 ENCOUNTER — Encounter: Payer: Self-pay | Admitting: Family Medicine

## 2018-09-24 VITALS — BP 126/78 | HR 76 | Temp 98.1°F | Resp 16 | Ht 70.0 in | Wt 189.7 lb

## 2018-09-24 DIAGNOSIS — D229 Melanocytic nevi, unspecified: Secondary | ICD-10-CM | POA: Diagnosis not present

## 2018-09-24 DIAGNOSIS — E039 Hypothyroidism, unspecified: Secondary | ICD-10-CM

## 2018-09-24 DIAGNOSIS — Z131 Encounter for screening for diabetes mellitus: Secondary | ICD-10-CM

## 2018-09-24 DIAGNOSIS — Z0000 Encounter for general adult medical examination without abnormal findings: Secondary | ICD-10-CM | POA: Diagnosis not present

## 2018-09-24 DIAGNOSIS — Z13220 Encounter for screening for lipoid disorders: Secondary | ICD-10-CM

## 2018-09-24 DIAGNOSIS — R196 Halitosis: Secondary | ICD-10-CM

## 2018-09-24 NOTE — Patient Instructions (Addendum)
Preventive Care 18-39 Years, Male Preventive care refers to lifestyle choices and visits with your health care provider that can promote health and wellness. What does preventive care include?  A yearly physical exam. This is also called an annual well check.  Dental exams once or twice a year.  Routine eye exams. Ask your health care provider how often you should have your eyes checked.  Personal lifestyle choices, including: ? Daily care of your teeth and gums. ? Regular physical activity. ? Eating a healthy diet. ? Avoiding tobacco and drug use. ? Limiting alcohol use. ? Practicing safe sex. What happens during an annual well check? The services and screenings done by your health care provider during your annual well check will depend on your age, overall health, lifestyle risk factors, and family history of disease. Counseling Your health care provider may ask you questions about your:  Alcohol use.  Tobacco use.  Drug use.  Emotional well-being.  Home and relationship well-being.  Sexual activity.  Eating habits.  Work and work Statistician.  Screening You may have the following tests or measurements:  Height, weight, and BMI.  Blood pressure.  Lipid and cholesterol levels. These may be checked every 5 years starting at age 23.  Diabetes screening. This is done by checking your blood sugar (glucose) after you have not eaten for a while (fasting).  Skin check.  Hepatitis C blood test.  Hepatitis B blood test.  Sexually transmitted disease (STD) testing.  Discuss your test results, treatment options, and if necessary, the need for more tests with your health care provider. Vaccines Your health care provider may recommend certain vaccines, such as:  Influenza vaccine. This is recommended every year.  Tetanus, diphtheria, and acellular pertussis (Tdap, Td) vaccine. You may need a Td booster every 10 years.  Varicella vaccine. You may need this if you  have not been vaccinated.  HPV vaccine. If you are 64 or younger, you may need three doses over 6 months.  Measles, mumps, and rubella (MMR) vaccine. You may need at least one dose of MMR.You may also need a second dose.  Pneumococcal 13-valent conjugate (PCV13) vaccine. You may need this if you have certain conditions and have not been vaccinated.  Pneumococcal polysaccharide (PPSV23) vaccine. You may need one or two doses if you smoke cigarettes or if you have certain conditions.  Meningococcal vaccine. One dose is recommended if you are age 65-21 years and a first-year college student living in a residence hall, or if you have one of several medical conditions. You may also need additional booster doses.  Hepatitis A vaccine. You may need this if you have certain conditions or if you travel or work in places where you may be exposed to hepatitis A.  Hepatitis B vaccine. You may need this if you have certain conditions or if you travel or work in places where you may be exposed to hepatitis B.  Haemophilus influenzae type b (Hib) vaccine. You may need this if you have certain risk factors.  Talk to your health care provider about which screenings and vaccines you need and how often you need them. This information is not intended to replace advice given to you by your health care provider. Make sure you discuss any questions you have with your health care provider. Document Released: 02/10/2002 Document Revised: 09/03/2016 Document Reviewed: 10/16/2015 Elsevier Interactive Patient Education  2018 [employer].  Halitosis Halitosis is bad breath. Halitosis may be caused by:  Foods and drinks  that you ingest.  Poor oral hygiene.  Medical conditions, such as sinus infections, mouth infections, and diabetes.  Medicines that dry out your mouth.  Smoking.  Follow these instructions at home:  Practice good oral hygiene. Do this by: ? Flossing every day. Ask your dentist to show you  the best way to floss. ? Brushing your teeth at least two times each day using toothpaste that is recommended by your dentist. Ask your dentist to show you the best way to brush your teeth. ? Brushing your tongue when you brush your teeth. This may help to improve your breath. ? Rinsing your mouth one time each day using mouthwash that is recommended by your dentist. ? Scheduling and attending regular dental appointments.  Drink enough water to keep your urine clear or pale yellow.  Eat foods that help to keep your teeth clean, such as carrots and celery.  Avoid foods and drinks that can lead to bad breath, such as: ? Garlic. ? Onions. ? Fish. ? Coffee. ? Alcohol. ? Horseradish. ? Red meat.  Do not use any tobacco products, including cigarettes, chewing tobacco, or electronic cigarettes. If you need help quitting, ask your health care provider.  Make sure that any mouth devices that you wear, such as a retainer or dentures, are worn and cleaned properly.  If you have a dry mouth, try chewing gum or mints that do not contain sugar. Contact a health care provider if:  You have new symptoms.  Your symptoms get worse or they do not improve with home care. This information is not intended to replace advice given to you by your health care provider. Make sure you discuss any questions you have with your health care provider. Document Released: 01/22/2005 Document Revised: 05/22/2016 Document Reviewed: 12/11/2014 Elsevier Interactive Patient Education  [employer].

## 2018-09-24 NOTE — Progress Notes (Signed)
Name: Niyam Bisping   MRN: [id number]    DOB: Mar 15, 1986   Date:09/24/2018       Progress Note  Subjective  Chief Complaint  Chief Complaint  Patient presents with  . Annual Exam    HPI  Patient presents for annual CPE    Depression:  Depression screen [facility] 2/9 09/24/2018 07/09/2018 09/18/2017 06/12/2017 11/24/2016  Decreased Interest 0 0 0 0 0  Down, Depressed, Hopeless 0 0 0 0 0  PHQ - 2 Score 0 0 0 0 0    Hypertension:  BP Readings from Last 3 Encounters:  09/24/18 126/78  07/09/18 118/72  03/19/18 120/90    Obesity: Wt Readings from Last 3 Encounters:  09/24/18 189 lb 11.2 oz (86 kg)  07/09/18 206 lb 4.8 oz (93.6 kg)  03/19/18 213 lb 11.2 oz (96.9 kg)   BMI Readings from Last 3 Encounters:  09/24/18 27.22 kg/m  07/09/18 29.60 kg/m  03/19/18 30.66 kg/m     Lipids:  Lab Results  Component Value Date   CHOL 200 (H) 09/18/2017   CHOL 146 10/17/2016   CHOL 185 08/31/2015   Lab Results  Component Value Date   HDL 58 09/18/2017   HDL 44 10/17/2016   HDL 46 08/31/2015   Lab Results  Component Value Date   LDLCALC 120 (H) 09/18/2017   LDLCALC 86 10/17/2016   LDLCALC 116 (H) 08/31/2015   Lab Results  Component Value Date   TRIG 108 09/18/2017   TRIG 82 10/17/2016   TRIG 114 08/31/2015   Lab Results  Component Value Date   CHOLHDL 3.4 09/18/2017   CHOLHDL 3.3 10/17/2016   CHOLHDL 4.0 08/31/2015   No results found for: LDLDIRECT Glucose:  Glucose  Date Value Ref Range Status  08/31/2015 95 65 - 99 mg/dL Final   Glucose, Bld  Date Value Ref Range Status  09/18/2017 83 65 - 99 mg/dL Final    Comment:    .            Fasting reference interval .   10/17/2016 77 65 - 99 mg/dL Final      Office Visit from 09/24/2018 in [facility]  AUDIT-C Score  4      Married STD testing and prevention (HIV/chl/gon/syphilis):   Skin cancer: discussed atypical lesions Colorectal cancer: not due Prostate cancer: no family  history   IPSS Questionnaire (AUA-7): Over the past month.   1)  How often have you had a sensation of not emptying your bladder completely after you finish urinating?  0 - Not at all  2)  How often have you had to urinate again less than two hours after you finished urinating? 0 - Not at all  3)  How often have you found you stopped and started again several times when you urinated?  0 - Not at all  4) How difficult have you found it to postpone urination?  0 - Not at all  5) How often have you had a weak urinary stream?  0 - Not at all  6) How often have you had to push or strain to begin urination?  0 - Not at all  7) How many times did you most typically get up to urinate from the time you went to bed until the time you got up in the morning?  1 - 1 time  Total score:  0-7 mildly symptomatic   8-19 moderately symptomatic   20-35 severely symptomatic  Advanced Care Planning: A voluntary discussion about advance care planning including the explanation and discussion of advance directives.  Discussed health care proxy and Living will, and the patient was able to identify a health care proxy as Jinny Blossom , his wife.  Patient does not have a living will at present time.  Patient Active Problem List   Diagnosis Date Noted  . Family history of rheumatoid arthritis 07/28/2018  . Hyperuricemia 07/28/2018  . Vitreous floater, bilateral 05/21/2018  . Dry eye syndrome of both lacrimal glands 05/21/2018  . Peroneal tendinitis 02/12/2018  . Overweight (BMI 25.0-29.9) 09/18/2017  . Bone spur of left ankle 03/28/2016  . Insomnia, persistent 08/26/2015  . History of hypertension 08/26/2015  . Adult hypothyroidism 08/26/2015  . B12 deficiency 08/26/2015  . Gout of big toe 08/26/2015  . Vitamin D deficiency 08/26/2015    Past Surgical History:  Procedure Laterality Date  . EYE SURGERY Bilateral 2012   Laser Eye Surgery  . FOOT SURGERY Right 2000  . MANDIBLE FRACTURE SURGERY  2001  .  RHINOPLASTY  2013    Family History  Problem Relation Age of Onset  . Anxiety disorder Mother   . Rheum arthritis Mother   . Hypothyroidism Father   . Heart Problems Brother   . Rheum arthritis Maternal Grandmother   . Pancreatic cancer Maternal Grandfather   . Liver cancer Paternal Grandfather     Social History   Socioeconomic History  . Marital status: Married    Spouse name: Jinny Blossom  . Number of children: 0  . Years of education: Not on file  . Highest education level: Doctorate  Occupational History  . Occupation: PT  Social Needs  . Financial resource strain: Not hard at all  . Food insecurity:    Worry: Never true    Inability: Never true  . Transportation needs:    Medical: No    Non-medical: No  Tobacco Use  . Smoking status: Never Smoker  . Smokeless tobacco: Never Used  Substance and Sexual Activity  . Alcohol use: Yes    Alcohol/week: 0.0 standard drinks    Comment: OCCASIONALLY  . Drug use: No  . Sexual activity: Yes    Partners: Female  Lifestyle  . Physical activity:    Days per week: 7 days    Minutes per session: 60 min  . Stress: Only a little  Relationships  . Social connections:    Talks on phone: More than three times a week    Gets together: Once a week    Attends religious service: 1 to 4 times per year    Active member of club or organization: Yes    Attends meetings of clubs or organizations: More than 4 times per year    Relationship status: Married  . Intimate partner violence:    Fear of current or ex partner: No    Emotionally abused: No    Physically abused: No    Forced sexual activity: No  Other Topics Concern  . Not on file  Social History Narrative  . Not on file     Current Outpatient Medications:  .  Acetaminophen (TYLENOL) 325 MG CAPS, Take 2 capsules by mouth as needed., Disp: , Rfl:  .  allopurinol (ZYLOPRIM) 100 MG tablet, Take 200 mg by mouth daily. , Disp: , Rfl:  .  Cholecalciferol (VITAMIN D) 2000 units  CAPS, Take 1 capsule by mouth daily., Disp: , Rfl:  .  colchicine 0.6 MG tablet, Take 2  tablets (1.2 mg total) by mouth daily as needed (and repat in one hour if no resolution of pain)., Disp: 30 tablet, Rfl: 0 .  ibuprofen (ADVIL) 200 MG tablet, Take 2 tablets by mouth as needed., Disp: , Rfl:  .  levothyroxine (SYNTHROID, LEVOTHROID) 75 MCG tablet, Take 1 tablet (75 mcg total) by mouth daily., Disp: 90 tablet, Rfl: 1 .  Phentermine-Topiramate (QSYMIA) 7.5-46 MG CP24, Take 1 capsule by mouth daily., Disp: 30 capsule, Rfl: 2 .  Vitamin D, Ergocalciferol, (DRISDOL) 50000 units CAPS capsule, Take 1 capsule (50,000 Units total) by mouth every 7 (seven) days., Disp: 12 capsule, Rfl: 0 .  Methylcobalamin 1000 MCG SUBL, Place 1 tablet under the tongue daily. (Patient not taking: Reported on 03/19/2018), Disp: 30 tablet, Rfl: 0 .  Probiotic Product (ALIGN PO), Take 1 capsule by mouth daily., Disp: , Rfl:  .  traMADol (ULTRAM) 50 MG tablet, Take 1 tablet (50 mg total) by mouth every 8 (eight) hours as needed. (Patient not taking: Reported on 09/24/2018), Disp: 15 tablet, Rfl: 0  Allergies  Allergen Reactions  . Rhus Coriaria [[place]] Hives     ROS  Constitutional: Negative for fever, positive for weight change  - losing by lifestyle modification.  Respiratory: Negative for cough and shortness of breath.   Cardiovascular: Negative for chest pain or palpitations.  Gastrointestinal: Negative for abdominal pain, no bowel changes.  Musculoskeletal: Negative for gait problem or joint swelling.  Skin: Negative for rash.  Neurological: Negative for dizziness or headache.  No other specific complaints in a complete review of systems (except as listed in HPI above).  Objective  Vitals:   09/24/18 1407  BP: 126/78  Pulse: 76  Resp: 16  Temp: 98.1 F (36.7 C)  TempSrc: Oral  SpO2: 99%  Weight: 189 lb 11.2 oz (86 kg)  Height: 5\' 10"  (1.778 m)    Body mass index is 27.22 kg/m.  Physical  Exam  Constitutional: Patient appears well-developed and overweight. . No distress.  HENT: Head: Normocephalic and atraumatic. Ears: B TMs ok, no erythema or effusion; Nose: Nose normal. Mouth/Throat: Oropharynx is clear and moist. No oropharyngeal exudate.  Eyes: Conjunctivae and EOM are normal. Pupils are equal, round, and reactive to light. No scleral icterus.  Neck: Normal range of motion. Neck supple. No JVD present. No thyromegaly present.  Cardiovascular: Normal rate, regular rhythm and normal heart sounds.  No murmur heard. No BLE edema. Pulmonary/Chest: Effort normal and breath sounds normal. No respiratory distress. Abdominal: Soft. Bowel sounds are normal, no distension. There is no tenderness. no masses MALE GENITALIA: normal exam RECTAL: not done Musculoskeletal: Normal range of motion, no joint effusions. No gross deformities Neurological: he is alert and oriented to person, place, and time. No cranial nerve deficit. Coordination, balance, strength, speech and gait are normal.  Skin: Skin is warm and dry. No rash noted. No erythema. Multiple atypical lesions  Psychiatric: Patient has a normal mood and affect. behavior is normal. Judgment and thought content normal.  PHQ2/9: Depression screen [facility] 2/9 09/24/2018 07/09/2018 09/18/2017 06/12/2017 11/24/2016  Decreased Interest 0 0 0 0 0  Down, Depressed, Hopeless 0 0 0 0 0  PHQ - 2 Score 0 0 0 0 0    Fall Risk: Fall Risk  09/24/2018 07/09/2018 09/18/2017 06/12/2017 11/24/2016  Falls in the past year? No No No No No     Functional Status Survey: Is the patient deaf or have difficulty hearing?: No Does the patient have difficulty seeing, even  when wearing glasses/contacts?: No(Laser Eye Surgery) Does the patient have difficulty concentrating, remembering, or making decisions?: No Does the patient have difficulty walking or climbing stairs?: No Does the patient have difficulty dressing or bathing?: No Does the patient have  difficulty doing errands alone such as visiting a doctor's office or shopping?: No    Assessment & Plan  1. Well woman exam  - CBC with Differential/Platelet - COMPLETE METABOLIC PANEL WITH GFR  2. Adult hypothyroidism  - TSH  3. Diabetes mellitus screening  - Hemoglobin A1c  4. Lipid screening  - Lipid panel   5. Halitosis  Wife noticed about 4 months ago, no abdominal pain, no change in bowel movements, blood in stools, losing with life style modification and qsymia. No toothache. Eating healthier. We may need to refer to ENT if symptoms persists. He will call back for that if needed    -Prostate cancer screening and PSA options (with potential risks and benefits of testing vs not testing) were discussed along with recent recs/guidelines. -USPSTF grade A and B recommendations reviewed with patient; age-appropriate recommendations, preventive care, screening tests, etc discussed and encouraged; healthy living encouraged; see AVS for patient education given to patient -Discussed importance of 150 minutes of physical activity weekly, eat two servings of fish weekly, eat one serving of tree nuts ( cashews, pistachios, pecans, almonds.Marland Kitchen) every other day, eat 6 servings of fruit/vegetables daily and drink plenty of water and avoid sweet beverages.

## 2018-09-24 NOTE — Addendum Note (Signed)
Addended by: Saunders Glance A on: 09/24/2018 03:38 PM   Modules accepted: Orders

## 2018-09-25 LAB — COMPLETE METABOLIC PANEL WITH GFR
AG Ratio: 2 (calc) (ref 1.0–2.5)
ALKALINE PHOSPHATASE (APISO): 49 U/L (ref 40–115)
ALT: 19 U/L (ref 9–46)
AST: 17 U/L (ref 10–40)
Albumin: 4.7 g/dL (ref 3.6–5.1)
BUN: 9 mg/dL (ref 7–25)
CALCIUM: 9.8 mg/dL (ref 8.6–10.3)
CHLORIDE: 107 mmol/L (ref 98–110)
CO2: 26 mmol/L (ref 20–32)
Creat: 0.94 mg/dL (ref 0.60–1.35)
GFR, Est African American: 123 mL/min/{1.73_m2} (ref >=60)
GFR, Est Non African American: 106 mL/min/{1.73_m2} (ref >=60)
GLUCOSE: 81 mg/dL (ref 65–99)
Globulin: 2.3 g/dL (calc) (ref 1.9–3.7)
POTASSIUM: 3.6 mmol/L (ref 3.5–5.3)
SODIUM: 142 mmol/L (ref 135–146)
Total Bilirubin: 0.7 mg/dL (ref 0.2–1.2)
Total Protein: 7 g/dL (ref 6.1–8.1)

## 2018-09-25 LAB — LIPID PANEL
CHOL/HDL RATIO: 3.9 (calc) (ref <5.0)
Cholesterol: 161 mg/dL (ref <200)
HDL: 41 mg/dL (ref >40)
LDL Cholesterol (Calc): 102 mg/dL (calc) — ABNORMAL HIGH
Non-HDL Cholesterol (Calc): 120 mg/dL (calc) (ref <130)
Triglycerides: 87 mg/dL (ref <150)

## 2018-09-25 LAB — HEMOGLOBIN A1C
EAG (MMOL/L): 5.5 (calc)
Hgb A1c MFr Bld: 5.1 % of total Hgb (ref <5.7)
Mean Plasma Glucose: 100 (calc)

## 2018-09-25 LAB — CBC WITH DIFFERENTIAL/PLATELET
BASOS ABS: 51 {cells}/uL (ref 0–200)
Basophils Relative: 0.8 %
EOS ABS: 122 {cells}/uL (ref 15–500)
Eosinophils Relative: 1.9 %
HCT: 43 % (ref 38.5–50.0)
HEMOGLOBIN: 14.3 g/dL (ref 13.2–17.1)
Lymphs Abs: 2310 cells/uL (ref 850–3900)
MCH: 28.1 pg (ref 27.0–33.0)
MCHC: 33.3 g/dL (ref 32.0–36.0)
MCV: 84.6 fL (ref 80.0–100.0)
MONOS PCT: 6.6 %
MPV: 10.2 fL (ref 7.5–12.5)
NEUTROS PCT: 54.6 %
Neutro Abs: 3494 cells/uL (ref 1500–7800)
PLATELETS: 279 10*3/uL (ref 140–400)
RBC: 5.08 10*6/uL (ref 4.20–5.80)
RDW: 13.1 % (ref 11.0–15.0)
TOTAL LYMPHOCYTE: 36.1 %
WBC mixed population: 422 cells/uL (ref 200–950)
WBC: 6.4 10*3/uL (ref 3.8–10.8)

## 2018-09-25 LAB — TSH: TSH: 1.81 mIU/L (ref 0.40–4.50)

## 2018-10-11 ENCOUNTER — Encounter: Payer: Self-pay | Admitting: Family Medicine

## 2018-10-29 DIAGNOSIS — M25471 Effusion, right ankle: Secondary | ICD-10-CM | POA: Diagnosis not present

## 2018-10-29 DIAGNOSIS — Z79899 Other long term (current) drug therapy: Secondary | ICD-10-CM | POA: Diagnosis not present

## 2018-10-29 DIAGNOSIS — E790 Hyperuricemia without signs of inflammatory arthritis and tophaceous disease: Secondary | ICD-10-CM | POA: Diagnosis not present

## 2018-10-29 DIAGNOSIS — M25571 Pain in right ankle and joints of right foot: Secondary | ICD-10-CM | POA: Diagnosis not present

## 2018-10-29 DIAGNOSIS — M109 Gout, unspecified: Secondary | ICD-10-CM | POA: Diagnosis not present

## 2018-10-31 ENCOUNTER — Encounter: Payer: Self-pay | Admitting: Family Medicine

## 2018-11-01 ENCOUNTER — Other Ambulatory Visit: Payer: Self-pay | Admitting: Family Medicine

## 2018-11-01 DIAGNOSIS — R196 Halitosis: Secondary | ICD-10-CM

## 2018-11-18 DIAGNOSIS — L578 Other skin changes due to chronic exposure to nonionizing radiation: Secondary | ICD-10-CM | POA: Diagnosis not present

## 2018-11-18 DIAGNOSIS — Z1283 Encounter for screening for malignant neoplasm of skin: Secondary | ICD-10-CM | POA: Diagnosis not present

## 2018-11-18 DIAGNOSIS — L814 Other melanin hyperpigmentation: Secondary | ICD-10-CM | POA: Diagnosis not present

## 2018-11-18 DIAGNOSIS — D229 Melanocytic nevi, unspecified: Secondary | ICD-10-CM | POA: Diagnosis not present

## 2018-11-18 DIAGNOSIS — D1800 Hemangioma unspecified site: Secondary | ICD-10-CM | POA: Diagnosis not present

## 2018-11-18 DIAGNOSIS — L219 Seborrheic dermatitis, unspecified: Secondary | ICD-10-CM | POA: Diagnosis not present

## 2019-01-14 ENCOUNTER — Ambulatory Visit: Payer: 59 | Admitting: Family Medicine

## 2019-01-28 ENCOUNTER — Other Ambulatory Visit: Payer: Self-pay | Admitting: Family Medicine

## 2019-01-28 DIAGNOSIS — E039 Hypothyroidism, unspecified: Secondary | ICD-10-CM

## 2019-01-28 NOTE — Telephone Encounter (Signed)
Refill request for thyroid medication. Levothyroxine to [facility]  Last visit: 09/24/2018  Lab Results  Component Value Date   TSH 1.81 09/24/2018     Follow up 04/01/2019

## 2019-04-01 ENCOUNTER — Ambulatory Visit: Payer: Self-pay | Admitting: Family Medicine

## 2019-05-10 ENCOUNTER — Other Ambulatory Visit: Payer: Self-pay | Admitting: Family Medicine

## 2019-05-10 DIAGNOSIS — E039 Hypothyroidism, unspecified: Secondary | ICD-10-CM

## 2019-05-10 NOTE — Telephone Encounter (Signed)
Pt is aware that you are wanting him to come in for med refill, however he said due to work he isnt able to come right now. I did offer the telephone and doximity appt but he said he currently isnt able to do that either. Pt did schedule appt for August and asked if you would please consider giving him enough meds to last until then.

## 2019-05-10 NOTE — Telephone Encounter (Signed)
Refill request for thyroid medication: Levothyroxine 75 mcg  Last office visit: 09/24/2018  Lab Results  Component Value Date   TSH 1.81 09/24/2018   Follow-ups on file. None indicated

## 2019-05-11 NOTE — Telephone Encounter (Signed)
Called and lvm informing him of refill request.

## 2019-05-13 ENCOUNTER — Other Ambulatory Visit: Payer: Self-pay

## 2019-05-13 ENCOUNTER — Encounter: Payer: Self-pay | Admitting: Family Medicine

## 2019-05-13 ENCOUNTER — Ambulatory Visit (INDEPENDENT_AMBULATORY_CARE_PROVIDER_SITE_OTHER): Payer: No Typology Code available for payment source | Admitting: Family Medicine

## 2019-05-13 VITALS — BP 120/84 | HR 56 | Temp 97.9°F | Resp 16 | Ht 70.0 in | Wt 203.5 lb

## 2019-05-13 DIAGNOSIS — Z8639 Personal history of other endocrine, nutritional and metabolic disease: Secondary | ICD-10-CM

## 2019-05-13 DIAGNOSIS — M25579 Pain in unspecified ankle and joints of unspecified foot: Secondary | ICD-10-CM

## 2019-05-13 DIAGNOSIS — M1A0710 Idiopathic chronic gout, right ankle and foot, without tophus (tophi): Secondary | ICD-10-CM

## 2019-05-13 DIAGNOSIS — E538 Deficiency of other specified B group vitamins: Secondary | ICD-10-CM | POA: Diagnosis not present

## 2019-05-13 DIAGNOSIS — G4700 Insomnia, unspecified: Secondary | ICD-10-CM

## 2019-05-13 DIAGNOSIS — G8929 Other chronic pain: Secondary | ICD-10-CM

## 2019-05-13 DIAGNOSIS — E559 Vitamin D deficiency, unspecified: Secondary | ICD-10-CM

## 2019-05-13 DIAGNOSIS — E039 Hypothyroidism, unspecified: Secondary | ICD-10-CM | POA: Diagnosis not present

## 2019-05-13 LAB — TSH: TSH: 2.6 mIU/L (ref 0.40–4.50)

## 2019-05-13 MED ORDER — LEVOTHYROXINE SODIUM 75 MCG PO TABS: 75.0000 ug | ORAL_TABLET | Freq: Every day | ORAL | 3 refills | Status: DC

## 2019-05-13 MED ORDER — VITAMIN D (ERGOCALCIFEROL) 1.25 MG (50000 UNIT) PO CAPS: 50000.0000 [IU] | ORAL_CAPSULE | ORAL | 1 refills | Status: DC

## 2019-05-13 MED ORDER — HYDROXYZINE HCL 10 MG PO TABS: 10.0000 mg | ORAL_TABLET | Freq: Every day | ORAL | 0 refills | Status: DC

## 2019-05-13 NOTE — Progress Notes (Signed)
Name: Oscar Allen   MRN: [id number]    DOB: January 11, 1986   Date:05/13/2019       Progress Note  Subjective  Chief Complaint  Chief Complaint  Patient presents with  . Hypothyroidism  . Referral    HPI  Chronic right ankle pain: it was present for months, seen by Ortho and had MRI that was unremarkable, finally saw Rheumatologist Dr. Lenetta Quaker and was started on allopurinol bid and colchine daily, he is doing well with intermittent toe pain, he is still taking allopurinol daily but weaned self off colchicine to take prn only .  Hypothyroidism: he continues to ha ve fatigue, but weight is under control at this time. He is compliant with medication, he has noticed constipation over the past couple of weeks, we will recheck TSH   Snoring/interrupted sleep: he had a high ESS - 14- in our office, also states father and younger brother have sleep apnea. He had a sleep study and it was negative .  Obesity: he tried Contrave, but did not noticed any change in his appetite, also tool Belviq for the past 6 weeks and he gained weight.  He states since he started on Qsymia 11/2016, initial weight of 251 lbs and he went as low as 198 lbs,currently off medication and weight has been stable around 200 lbs  Insomnia: he is able to fall asleep but wakes up a couple of hours later and has difficulty to fall back asleep after that. He sometimes wakes up feeling startle thinking he will miss work.   Patient Active Problem List   Diagnosis Date Noted  . Family history of rheumatoid arthritis 07/28/2018  . Hyperuricemia 07/28/2018  . Vitreous floater, bilateral 05/21/2018  . Dry eye syndrome of both lacrimal glands 05/21/2018  . Peroneal tendinitis 02/12/2018  . Overweight (BMI 25.0-29.9) 09/18/2017  . Bone spur of left ankle 03/28/2016  . Insomnia, persistent 08/26/2015  . History of hypertension 08/26/2015  . Adult hypothyroidism 08/26/2015  . B12 deficiency 08/26/2015  . Gout of big toe  08/26/2015  . Vitamin D deficiency 08/26/2015    Past Surgical History:  Procedure Laterality Date  . EYE SURGERY Bilateral 2012   Laser Eye Surgery  . FOOT SURGERY Right 2000  . MANDIBLE FRACTURE SURGERY  2001  . RHINOPLASTY  2013    Family History  Problem Relation Age of Onset  . Anxiety disorder Mother   . Rheum arthritis Mother   . Hypothyroidism Father   . Heart Problems Brother   . Rheum arthritis Maternal Grandmother   . Pancreatic cancer Maternal Grandfather   . Liver cancer Paternal Grandfather     Social History   Socioeconomic History  . Marital status: Married    Spouse name: Jinny Blossom  . Number of children: 0  . Years of education: Not on file  . Highest education level: Doctorate  Occupational History  . Occupation: PT  Social Needs  . Financial resource strain: Not hard at all  . Food insecurity:    Worry: Never true    Inability: Never true  . Transportation needs:    Medical: No    Non-medical: No  Tobacco Use  . Smoking status: Never Smoker  . Smokeless tobacco: Never Used  Substance and Sexual Activity  . Alcohol use: Yes    Alcohol/week: 0.0 standard drinks    Comment: OCCASIONALLY  . Drug use: No  . Sexual activity: Yes    Partners: Female  Lifestyle  . Physical activity:  Days per week: 7 days    Minutes per session: 60 min  . Stress: Only a little  Relationships  . Social connections:    Talks on phone: More than three times a week    Gets together: Once a week    Attends religious service: 1 to 4 times per year    Active member of club or organization: Yes    Attends meetings of clubs or organizations: More than 4 times per year    Relationship status: Married  . Intimate partner violence:    Fear of current or ex partner: No    Emotionally abused: No    Physically abused: No    Forced sexual activity: No  Other Topics Concern  . Not on file  Social History Narrative  . Not on file     Current Outpatient Medications:   .  Acetaminophen (TYLENOL) 325 MG CAPS, Take 2 capsules by mouth as needed., Disp: , Rfl:  .  allopurinol (ZYLOPRIM) 100 MG tablet, Take 200 mg by mouth daily., Disp: , Rfl:  .  colchicine 0.6 MG tablet, Take 2 tablets (1.2 mg total) by mouth daily as needed (and repat in one hour if no resolution of pain)., Disp: 30 tablet, Rfl: 0 .  ibuprofen (ADVIL) 200 MG tablet, Take 2 tablets by mouth as needed., Disp: , Rfl:  .  levothyroxine (SYNTHROID) 75 MCG tablet, Take 1 tablet (75 mcg total) by mouth daily., Disp: 90 tablet, Rfl: 3 .  Methylcobalamin 1000 MCG SUBL, Place 1 tablet under the tongue daily., Disp: 30 tablet, Rfl: 0 .  Vitamin D, Ergocalciferol, (DRISDOL) 1.25 MG (50000 UT) CAPS capsule, Take 1 capsule (50,000 Units total) by mouth every 7 (seven) days., Disp: 12 capsule, Rfl: 1 .  hydrOXYzine (ATARAX/VISTARIL) 10 MG tablet, Take 1-2 tablets (10-20 mg total) by mouth at bedtime., Disp: 60 tablet, Rfl: 0  Allergies  Allergen Reactions  . Rhus Coriaria [Sumac] Hives    I personally reviewed active problem list, medication list, allergies, family history, social history with the patient/caregiver today.   ROS  Ten systems reviewed and is negative except as mentioned in HPI   Objective  Vitals:   05/13/19 0751  BP: 120/84  Pulse: (!) 56  Resp: 16  Temp: 97.9 F (36.6 C)  TempSrc: Oral  SpO2: 99%  Weight: 203 lb 8 oz (92.3 kg)  Height: 5\' 10"  (1.778 m)    Body mass index is 29.2 kg/m.  Physical Exam  Constitutional: Patient appears well-developed and well-nourished. Obese  No distress.  HEENT: head atraumatic, normocephalic, pupils equal and reactive to light, no thyromegaly ,  neck supple, throat within normal limits Cardiovascular: Normal rate, regular rhythm and normal heart sounds.  No murmur heard. No BLE edema. Pulmonary/Chest: Effort normal and breath sounds normal. No respiratory distress. Abdominal: Soft.  There is no tenderness. Psychiatric: Patient has a  normal mood and affect. behavior is normal. Judgment and thought content normal.  PHQ2/9: Depression screen [facility] 2/9 05/13/2019 09/24/2018 07/09/2018 09/18/2017 06/12/2017  Decreased Interest 0 0 0 0 0  Down, Depressed, Hopeless 0 0 0 0 0  PHQ - 2 Score 0 0 0 0 0  Altered sleeping 3 - - - -  Tired, decreased energy 3 - - - -  Change in appetite 0 - - - -  Feeling bad or failure about yourself  0 - - - -  Trouble concentrating 0 - - - -  Moving slowly or fidgety/restless 0 - - - -  Suicidal thoughts 0 - - - -  PHQ-9 Score 6 - - - -  Difficult doing work/chores Not difficult at all - - - -    phq 9 is negative   Fall Risk: Fall Risk  05/13/2019 09/24/2018 07/09/2018 09/18/2017 06/12/2017  Falls in the past year? 0 No No No No  Number falls in past yr: 0 - - - -  Injury with Fall? 0 - - - -    Functional Status Survey: Is the patient deaf or have difficulty hearing?: No Does the patient have difficulty seeing, even when wearing glasses/contacts?: Yes Does the patient have difficulty concentrating, remembering, or making decisions?: No Does the patient have difficulty walking or climbing stairs?: No Does the patient have difficulty dressing or bathing?: No Does the patient have difficulty doing errands alone such as visiting a doctor's office or shopping?: No    Assessment & Plan  1. Adult hypothyroidism  - TSH - levothyroxine (SYNTHROID) 75 MCG tablet; Take 1 tablet (75 mcg total) by mouth daily.  Dispense: 90 tablet; Refill: 3  2. B12 deficiency  Advised to resume B12 SL  3. History of obesity  Doing very well with life style modification   4. Chronic gout of right ankle, unspecified cause  - Ambulatory referral to Rheumatology  5. Chronic ankle pain, unspecified laterality  - Ambulatory referral to Rheumatology  6. Vitamin D deficiency  - Vitamin D, Ergocalciferol, (DRISDOL) 1.25 MG (50000 UT) CAPS capsule; Take 1 capsule (50,000 Units total) by mouth every 7 (seven)  days.  Dispense: 12 capsule; Refill: 1  7. Insomnia, persistent  He falls asleep but unable to stay asleep, we will try a prn medication, also advised to exercise more  - hydrOXYzine (ATARAX/VISTARIL) 10 MG tablet; Take 1-2 tablets (10-20 mg total) by mouth at bedtime.  Dispense: 60 tablet; Refill: 0

## 2019-08-05 ENCOUNTER — Ambulatory Visit: Payer: Self-pay | Admitting: Family Medicine

## 2019-10-07 ENCOUNTER — Encounter: Payer: Self-pay | Admitting: Family Medicine

## 2019-10-07 ENCOUNTER — Ambulatory Visit (INDEPENDENT_AMBULATORY_CARE_PROVIDER_SITE_OTHER): Payer: No Typology Code available for payment source | Admitting: Family Medicine

## 2019-10-07 ENCOUNTER — Other Ambulatory Visit: Payer: Self-pay

## 2019-10-07 VITALS — BP 114/72 | HR 78 | Temp 97.5°F | Resp 16 | Ht 69.25 in | Wt 201.2 lb

## 2019-10-07 DIAGNOSIS — E039 Hypothyroidism, unspecified: Secondary | ICD-10-CM

## 2019-10-07 DIAGNOSIS — E538 Deficiency of other specified B group vitamins: Secondary | ICD-10-CM

## 2019-10-07 DIAGNOSIS — E559 Vitamin D deficiency, unspecified: Secondary | ICD-10-CM

## 2019-10-07 DIAGNOSIS — Z1159 Encounter for screening for other viral diseases: Secondary | ICD-10-CM

## 2019-10-07 DIAGNOSIS — Z23 Encounter for immunization: Secondary | ICD-10-CM | POA: Diagnosis not present

## 2019-10-07 DIAGNOSIS — Z13220 Encounter for screening for lipoid disorders: Secondary | ICD-10-CM

## 2019-10-07 DIAGNOSIS — Z0000 Encounter for general adult medical examination without abnormal findings: Secondary | ICD-10-CM | POA: Diagnosis not present

## 2019-10-07 DIAGNOSIS — Z131 Encounter for screening for diabetes mellitus: Secondary | ICD-10-CM

## 2019-10-07 DIAGNOSIS — Z8639 Personal history of other endocrine, nutritional and metabolic disease: Secondary | ICD-10-CM

## 2019-10-07 MED ORDER — VITAMIN D 50 MCG (2000 UT) PO CAPS: 1.0000 | ORAL_CAPSULE | Freq: Every day | ORAL | 0 refills | Status: DC

## 2019-10-07 NOTE — Progress Notes (Signed)
Name: Oscar Allen   MRN: [id number]    DOB: 05-Dec-1986   Date:10/07/2019       Progress Note  Subjective  Chief Complaint  Chief Complaint  Patient presents with  . Annual Exam    HPI  Patient presents for annual CPE  USPSTF grade A and B recommendations:  Diet: following weight watchers currently  Exercise: on regular basis at home   Depression: phq 9 is negative Depression screen [facility] 2/9 10/07/2019 05/13/2019 09/24/2018 07/09/2018 09/18/2017  Decreased Interest 0 0 0 0 0  Down, Depressed, Hopeless 0 0 0 0 0  PHQ - 2 Score 0 0 0 0 0  Altered sleeping 1 3 - - -  Tired, decreased energy 1 3 - - -  Change in appetite 0 0 - - -  Feeling bad or failure about yourself  0 0 - - -  Trouble concentrating 0 0 - - -  Moving slowly or fidgety/restless 0 0 - - -  Suicidal thoughts 0 0 - - -  PHQ-9 Score 2 6 - - -  Difficult doing work/chores Not difficult at all Not difficult at all - - -    Hypertension:  BP Readings from Last 3 Encounters:  10/07/19 114/72  05/13/19 120/84  09/24/18 126/78    Obesity: Wt Readings from Last 3 Encounters:  10/07/19 201 lb 3.2 oz (91.3 kg)  05/13/19 203 lb 8 oz (92.3 kg)  09/24/18 189 lb 11.2 oz (86 kg)   BMI Readings from Last 3 Encounters:  10/07/19 29.50 kg/m  05/13/19 29.20 kg/m  09/24/18 27.22 kg/m     Lipids:  Lab Results  Component Value Date   CHOL 161 09/24/2018   CHOL 200 (H) 09/18/2017   CHOL 146 10/17/2016   Lab Results  Component Value Date   HDL 41 09/24/2018   HDL 58 09/18/2017   HDL 44 10/17/2016   Lab Results  Component Value Date   LDLCALC 102 (H) 09/24/2018   LDLCALC 120 (H) 09/18/2017   LDLCALC 86 10/17/2016   Lab Results  Component Value Date   TRIG 87 09/24/2018   TRIG 108 09/18/2017   TRIG 82 10/17/2016   Lab Results  Component Value Date   CHOLHDL 3.9 09/24/2018   CHOLHDL 3.4 09/18/2017   CHOLHDL 3.3 10/17/2016   No results found for: LDLDIRECT Glucose:  Glucose, Bld  Date  Value Ref Range Status  09/24/2018 81 65 - 99 mg/dL Final    Comment:    .            Fasting reference interval .   09/18/2017 83 65 - 99 mg/dL Final    Comment:    .            Fasting reference interval .   10/17/2016 77 65 - 99 mg/dL Final      [facility] Visit from 10/07/2019 in [facility]  AUDIT-C Score  1     Married STD testing and prevention (HIV/chl/gon/syphilis): N/A Hep C:  Today   Skin cancer: he sees dermatologist  Colorectal cancer: start at age 43  Prostate cancer: discussed USPTF   IPSS Questionnaire (AUA-7): Over the past month.   1)  How often have you had a sensation of not emptying your bladder completely after you finish urinating?  0 - Not at all  2)  How often have you had to urinate again less than two hours after you finished urinating? 0 - Not at all  3)  How often have you found you stopped and started again several times when you urinated?  0 - Not at all  4) How difficult have you found it to postpone urination?  0 - Not at all  5) How often have you had a weak urinary stream?  1 - Less than 1 time in 5  6) How often have you had to push or strain to begin urination?  1 - Less than 1 time in 5  7) How many times did you most typically get up to urinate from the time you went to bed until the time you got up in the morning?  0 - None  Total score:  0-7 mildly symptomatic   8-19 moderately symptomatic   20-35 severely symptomatic     Advanced Care Planning: A voluntary discussion about advance care planning including the explanation and discussion of advance directives.  Discussed health care proxy and Living will, and the patient was able to identify a health care proxy as wife.  Patient does not have a living will at present time.   Patient Active Problem List   Diagnosis Date Noted  . Family history of rheumatoid arthritis 07/28/2018  . Hyperuricemia 07/28/2018  . Vitreous floater, bilateral 05/21/2018  . Dry eye  syndrome of both lacrimal glands 05/21/2018  . Peroneal tendinitis 02/12/2018  . Overweight (BMI 25.0-29.9) 09/18/2017  . Bone spur of left ankle 03/28/2016  . Insomnia, persistent 08/26/2015  . History of hypertension 08/26/2015  . Adult hypothyroidism 08/26/2015  . B12 deficiency 08/26/2015  . Gout of big toe 08/26/2015  . Vitamin D deficiency 08/26/2015    Past Surgical History:  Procedure Laterality Date  . EYE SURGERY Bilateral 2012   Laser Eye Surgery  . FOOT SURGERY Right 2000  . MANDIBLE FRACTURE SURGERY  2001  . RHINOPLASTY  2013    Family History  Problem Relation Age of Onset  . Anxiety disorder Mother   . Rheum arthritis Mother   . Hypothyroidism Father   . Heart Problems Brother   . Rheum arthritis Maternal Grandmother   . Pancreatic cancer Maternal Grandfather   . Liver cancer Paternal Grandfather     Social History   Socioeconomic History  . Marital status: Married    Spouse name: Jinny Blossom  . Number of children: 0  . Years of education: Not on file  . Highest education level: Doctorate  Occupational History  . Occupation: PT  Social Needs  . Financial resource strain: Not hard at all  . Food insecurity    Worry: Never true    Inability: Never true  . Transportation needs    Medical: No    Non-medical: No  Tobacco Use  . Smoking status: Never Smoker  . Smokeless tobacco: Never Used  Substance and Sexual Activity  . Alcohol use: Yes    Alcohol/week: 0.0 standard drinks    Comment: OCCASIONALLY  . Drug use: No  . Sexual activity: Yes    Partners: Female  Lifestyle  . Physical activity    Days per week: 7 days    Minutes per session: 40 min  . Stress: Only a little  Relationships  . Social connections    Talks on phone: More than three times a week    Gets together: Once a week    Attends religious service: 1 to 4 times per year    Active member of club or organization: Yes    Attends meetings of clubs or organizations:  More than 4 times  per year    Relationship status: Married  . Intimate partner violence    Fear of current or ex partner: No    Emotionally abused: No    Physically abused: No    Forced sexual activity: No  Other Topics Concern  . Not on file  Social History Narrative  . Not on file     Current Outpatient Medications:  .  Acetaminophen (TYLENOL) 325 MG CAPS, Take 2 capsules by mouth as needed., Disp: , Rfl:  .  allopurinol (ZYLOPRIM) 100 MG tablet, Take 200 mg by mouth daily., Disp: , Rfl:  .  Cholecalciferol (VITAMIN D) 50 MCG (2000 UT) CAPS, Take 1 capsule (2,000 Units total) by mouth daily., Disp: 30 capsule, Rfl: 0 .  colchicine 0.6 MG tablet, Take 2 tablets (1.2 mg total) by mouth daily as needed (and repat in one hour if no resolution of pain)., Disp: 30 tablet, Rfl: 0 .  ibuprofen (ADVIL) 200 MG tablet, Take 2 tablets by mouth as needed., Disp: , Rfl:  .  levothyroxine (SYNTHROID) 75 MCG tablet, Take 1 tablet (75 mcg total) by mouth daily., Disp: 90 tablet, Rfl: 3 .  Methylcobalamin 1000 MCG SUBL, Place 1 tablet under the tongue daily., Disp: 30 tablet, Rfl: 0  Allergies  Allergen Reactions  . Rhus Coriaria [Sumac] Hives     ROS  Constitutional: Negative for fever or weight change. He has fatigue , but is chronic, and he has a history of mono Respiratory: Negative for cough and shortness of breath.   Cardiovascular: Negative for chest pain or palpitations.  Gastrointestinal: Negative for abdominal pain, no bowel changes.  Musculoskeletal: Negative for gait problem or joint swelling.  Skin: Negative for rash.  Neurological: Negative for dizziness or headache.  No other specific complaints in a complete review of systems (except as listed in HPI above).   Objective  Vitals:   10/07/19 1507  BP: 114/72  Pulse: 78  Resp: 16  Temp: (!) 97.5 F (36.4 C)  TempSrc: Temporal  SpO2: 99%  Weight: 201 lb 3.2 oz (91.3 kg)  Height: 5' 9.25" (1.759 m)    Body mass index is 29.5  kg/m.  Physical Exam  Constitutional: Patient appears well-developed and well-nourished. No distress.  HENT: Head: Normocephalic and atraumatic. Ears: B TMs ok, no erythema or effusion; Nose: Nose normal. Mouth/Throat: Oropharynx is clear and moist. No oropharyngeal exudate.  Eyes: Conjunctivae and EOM are normal. Pupils are equal, round, and reactive to light. No scleral icterus.  Neck: Normal range of motion. Neck supple. No JVD present. No thyromegaly present.  Cardiovascular: Normal rate, regular rhythm and normal heart sounds.  No murmur heard. No BLE edema. Pulmonary/Chest: Effort normal and breath sounds normal. No respiratory distress. Abdominal: Soft. Bowel sounds are normal, no distension. There is no tenderness. no masses MALE GENITALIA: Normal descended testes bilaterally, no masses palpated, no hernias, no lesions, no discharge RECTAL: not done  Musculoskeletal: Normal range of motion, no joint effusions. No gross deformities Neurological: he is alert and oriented to person, place, and time. No cranial nerve deficit. Coordination, balance, strength, speech and gait are normal.  Skin: Skin is warm and dry. No rash noted. No erythema.  Psychiatric: Patient has a normal mood and affect. behavior is normal. Judgment and thought content normal.  PHQ2/9: Depression screen [facility] 2/9 10/07/2019 05/13/2019 09/24/2018 07/09/2018 09/18/2017  Decreased Interest 0 0 0 0 0  Down, Depressed, Hopeless 0 0 0 0 0  PHQ - 2 Score 0 0 0 0 0  Altered sleeping 1 3 - - -  Tired, decreased energy 1 3 - - -  Change in appetite 0 0 - - -  Feeling bad or failure about yourself  0 0 - - -  Trouble concentrating 0 0 - - -  Moving slowly or fidgety/restless 0 0 - - -  Suicidal thoughts 0 0 - - -  PHQ-9 Score 2 6 - - -  Difficult doing work/chores Not difficult at all Not difficult at all - - -     Fall Risk: Fall Risk  10/07/2019 05/13/2019 09/24/2018 07/09/2018 09/18/2017  Falls in the past year? 0 0 No No  No  Number falls in past yr: 0 0 - - -  Injury with Fall? 0 0 - - -     Functional Status Survey: Is the patient deaf or have difficulty hearing?: No Does the patient have difficulty seeing, even when wearing glasses/contacts?: No Does the patient have difficulty concentrating, remembering, or making decisions?: No Does the patient have difficulty walking or climbing stairs?: No Does the patient have difficulty dressing or bathing?: No Does the patient have difficulty doing errands alone such as visiting a doctor's office or shopping?: No    Assessment & Plan  1. Well adult exam  - Lipid panel - CBC with Differential/Platelet - COMPLETE METABOLIC PANEL WITH GFR - Hemoglobin A1c - TSH - Hepatitis C antibody  2. Need for immunization against influenza  - Flu Vaccine QUAD 36+ mos IM  3. Adult hypothyroidism  - TSH  4. B12 deficiency  - CBC with Differential/Platelet - Vitamin B12  5. Vitamin D deficiency  - VITAMIN D 25 Hydroxy (Vit-D Deficiency, Fractures)  6. History of obesity  Doing well , continue weight watchers  7. Need for hepatitis C screening test  - Hepatitis C antibody  8. Lipid screening  - Lipid panel  9. Diabetes mellitus screening  - Hemoglobin A1c  -Prostate cancer screening and PSA options (with potential risks and benefits of testing vs not testing) were discussed along with recent recs/guidelines. -USPSTF grade A and B recommendations reviewed with patient; age-appropriate recommendations, preventive care, screening tests, etc discussed and encouraged; healthy living encouraged; see AVS for patient education given to patient -Discussed importance of 150 minutes of physical activity weekly, eat two servings of fish weekly, eat one serving of tree nuts ( cashews, pistachios, pecans, almonds.Marland Kitchen) every other day, eat 6 servings of fruit/vegetables daily and drink plenty of water and avoid sweet beverages.

## 2019-10-07 NOTE — Patient Instructions (Signed)
Preventive Care 19-34 Years Old, Male Preventive care refers to lifestyle choices and visits with your health care provider that can promote health and wellness. This includes:  A yearly physical exam. This is also called an annual well check.  Regular dental and eye exams.  Immunizations.  Screening for certain conditions.  Healthy lifestyle choices, such as eating a healthy diet, getting regular exercise, not using drugs or products that contain nicotine and tobacco, and limiting alcohol use. What can I expect for my preventive care visit? Physical exam Your health care provider will check:  Height and weight. These may be used to calculate body mass index (BMI), which is a measurement that tells if you are at a healthy weight.  Heart rate and blood pressure.  Your skin for abnormal spots. Counseling Your health care provider may ask you questions about:  Alcohol, tobacco, and drug use.  Emotional well-being.  Home and relationship well-being.  Sexual activity.  Eating habits.  Work and work Statistician. What immunizations do I need?  Influenza (flu) vaccine  This is recommended every year. Tetanus, diphtheria, and pertussis (Tdap) vaccine  You may need a Td booster every 10 years. Varicella (chickenpox) vaccine  You may need this vaccine if you have not already been vaccinated. Human papillomavirus (HPV) vaccine  If recommended by your health care provider, you may need three doses over 6 months. Measles, mumps, and rubella (MMR) vaccine  You may need at least one dose of MMR. You may also need a second dose. Meningococcal conjugate (MenACWY) vaccine  One dose is recommended if you are 45-76 years old and a Market researcher living in a residence hall, or if you have one of several medical conditions. You may also need additional booster doses. Pneumococcal conjugate (PCV13) vaccine  You may need this if you have certain conditions and were not  previously vaccinated. Pneumococcal polysaccharide (PPSV23) vaccine  You may need one or two doses if you smoke cigarettes or if you have certain conditions. Hepatitis A vaccine  You may need this if you have certain conditions or if you travel or work in places where you may be exposed to hepatitis A. Hepatitis B vaccine  You may need this if you have certain conditions or if you travel or work in places where you may be exposed to hepatitis B. Haemophilus influenzae type b (Hib) vaccine  You may need this if you have certain risk factors. You may receive vaccines as individual doses or as more than one vaccine together in one shot (combination vaccines). Talk with your health care provider about the risks and benefits of combination vaccines. What tests do I need? Blood tests  Lipid and cholesterol levels. These may be checked every 5 years starting at age 17.  Hepatitis C test.  Hepatitis B test. Screening   Diabetes screening. This is done by checking your blood sugar (glucose) after you have not eaten for a while (fasting).  Sexually transmitted disease (STD) testing. Talk with your health care provider about your test results, treatment options, and if necessary, the need for more tests. Follow these instructions at home: Eating and drinking   Eat a diet that includes fresh fruits and vegetables, whole grains, lean protein, and low-fat dairy products.  Take vitamin and mineral supplements as recommended by your health care provider.  Do not drink alcohol if your health care provider tells you not to drink.  If you drink alcohol: ? Limit how much you have to 0-2  drinks a day. ? Be aware of how much alcohol is in your drink. In the [place] one drink equals one 12 oz bottle of beer (355 mL), one 5 oz glass of wine (148 mL), or one 1 oz glass of hard liquor (44 mL). Lifestyle  Take daily care of your teeth and gums.  Stay active. Exercise for at least 30 minutes on 5 or  more days each week.  Do not use any products that contain nicotine or tobacco, such as cigarettes, e-cigarettes, and chewing tobacco. If you need help quitting, ask your health care provider.  If you are sexually active, practice safe sex. Use a condom or other form of protection to prevent STIs (sexually transmitted infections). What's next?  Go to your health care provider once a year for a well check visit.  Ask your health care provider how often you should have your eyes and teeth checked.  Stay up to date on all vaccines. This information is not intended to replace advice given to you by your health care provider. Make sure you discuss any questions you have with your health care provider. Document Released: 02/10/2002 Document Revised: 12/09/2018 Document Reviewed: 12/09/2018 Elsevier Patient Education  2020 Elsevier Inc.  

## 2019-10-10 LAB — COMPLETE METABOLIC PANEL WITH GFR
AG Ratio: 2.5 (calc) (ref 1.0–2.5)
ALT: 47 U/L — ABNORMAL HIGH (ref 9–46)
AST: 25 U/L (ref 10–40)
Albumin: 4.9 g/dL (ref 3.6–5.1)
Alkaline phosphatase (APISO): 38 U/L (ref 36–130)
BUN: 16 mg/dL (ref 7–25)
CO2: 29 mmol/L (ref 20–32)
Calcium: 10 mg/dL (ref 8.6–10.3)
Chloride: 104 mmol/L (ref 98–110)
Creat: 0.95 mg/dL (ref 0.60–1.35)
GFR, Est African American: 121 mL/min/{1.73_m2} (ref >=60)
GFR, Est Non African American: 104 mL/min/{1.73_m2} (ref >=60)
Globulin: 2 g/dL (calc) (ref 1.9–3.7)
Glucose, Bld: 88 mg/dL (ref 65–99)
Potassium: 4 mmol/L (ref 3.5–5.3)
Sodium: 141 mmol/L (ref 135–146)
Total Bilirubin: 0.9 mg/dL (ref 0.2–1.2)
Total Protein: 6.9 g/dL (ref 6.1–8.1)

## 2019-10-10 LAB — CBC WITH DIFFERENTIAL/PLATELET
Absolute Monocytes: 540 cells/uL (ref 200–950)
Basophils Absolute: 52 cells/uL (ref 0–200)
Basophils Relative: 0.7 %
Eosinophils Absolute: 141 cells/uL (ref 15–500)
Eosinophils Relative: 1.9 %
HCT: 44.4 % (ref 38.5–50.0)
Hemoglobin: 14.9 g/dL (ref 13.2–17.1)
Lymphs Abs: 2287 cells/uL (ref 850–3900)
MCH: 28 pg (ref 27.0–33.0)
MCHC: 33.6 g/dL (ref 32.0–36.0)
MCV: 83.3 fL (ref 80.0–100.0)
MPV: 10.5 fL (ref 7.5–12.5)
Monocytes Relative: 7.3 %
Neutro Abs: 4381 cells/uL (ref 1500–7800)
Neutrophils Relative %: 59.2 %
Platelets: 253 10*3/uL (ref 140–400)
RBC: 5.33 10*6/uL (ref 4.20–5.80)
RDW: 13.1 % (ref 11.0–15.0)
Total Lymphocyte: 30.9 %
WBC: 7.4 10*3/uL (ref 3.8–10.8)

## 2019-10-10 LAB — HEPATITIS C ANTIBODY
Hepatitis C Ab: NONREACTIVE
SIGNAL TO CUT-OFF: 0.01 (ref <1.00)

## 2019-10-10 LAB — HEMOGLOBIN A1C
Hgb A1c MFr Bld: 4.9 % of total Hgb (ref <5.7)
Mean Plasma Glucose: 94 (calc)
eAG (mmol/L): 5.2 (calc)

## 2019-10-10 LAB — LIPID PANEL
Cholesterol: 227 mg/dL — ABNORMAL HIGH (ref <200)
HDL: 54 mg/dL (ref >=40)
LDL Cholesterol (Calc): 145 mg/dL (calc) — ABNORMAL HIGH
Non-HDL Cholesterol (Calc): 173 mg/dL (calc) — ABNORMAL HIGH (ref <130)
Total CHOL/HDL Ratio: 4.2 (calc) (ref <5.0)
Triglycerides: 146 mg/dL (ref <150)

## 2019-10-10 LAB — TSH: TSH: 1.73 mIU/L (ref 0.40–4.50)

## 2019-10-10 LAB — VITAMIN B12: Vitamin B-12: 492 pg/mL (ref 200–1100)

## 2019-10-10 LAB — VITAMIN D 25 HYDROXY (VIT D DEFICIENCY, FRACTURES): Vit D, 25-Hydroxy: 24 ng/mL — ABNORMAL LOW (ref 30–100)

## 2019-10-18 ENCOUNTER — Other Ambulatory Visit: Payer: Self-pay | Admitting: Family Medicine

## 2019-10-18 ENCOUNTER — Encounter: Payer: Self-pay | Admitting: Family Medicine

## 2019-10-18 MED ORDER — VITAMIN D 50 MCG (2000 UT) PO CAPS: 1.0000 | ORAL_CAPSULE | Freq: Every day | ORAL | 0 refills | Status: DC

## 2019-11-30 ENCOUNTER — Encounter: Payer: Self-pay | Admitting: Family Medicine

## 2019-12-02 ENCOUNTER — Telehealth: Payer: No Typology Code available for payment source | Admitting: Family

## 2019-12-02 DIAGNOSIS — L237 Allergic contact dermatitis due to plants, except food: Secondary | ICD-10-CM

## 2019-12-02 MED ORDER — PREDNISONE 10 MG (21) PO TBPK: ORAL_TABLET | ORAL | 0 refills | Status: DC

## 2019-12-02 NOTE — Progress Notes (Signed)

## 2020-01-09 ENCOUNTER — Other Ambulatory Visit: Payer: Self-pay | Admitting: Family Medicine

## 2020-01-09 DIAGNOSIS — E559 Vitamin D deficiency, unspecified: Secondary | ICD-10-CM

## 2020-01-09 NOTE — Telephone Encounter (Signed)
Requested medication (s) are due for refill today: no  Requested medication (s) are on the active medication list: yes  Last refill:  10/21/2019  Future visit scheduled: yes  Notes to clinic: review for refill Looks like this dose has been discontinued    Requested Prescriptions  Pending Prescriptions Disp Refills   Vitamin D, Ergocalciferol, (DRISDOL) 1.25 MG (50000 UT) CAPS capsule [Pharmacy Med Name: VIT D2 1.25 MG (50,000 UNIT 1.25 MG Capsule] 12 capsule 1    Sig: TAKE 1 CAPSULE BY MOUTH EVERY 7 DAYS.      Endocrinology:  Vitamins - Vitamin D Supplementation Failed - 01/09/2020  7:43 AM      Failed - 50,000 IU strengths are not delegated      Failed - Phosphate in normal range and within 360 days    No results found for: PHOS        Failed - Vitamin D in normal range and within 360 days    Vit D, 25-Hydroxy  Date Value Ref Range Status  10/07/2019 24 (L) 30 - 100 ng/mL Final    Comment:    Vitamin D Status         25-OH Vitamin D: . Deficiency:                    <20 ng/mL Insufficiency:             20 - 29 ng/mL Optimal:                 > or = 30 ng/mL . For 25-OH Vitamin D testing on patients on  D2-supplementation and patients for whom quantitation  of D2 and D3 fractions is required, the QuestAssureD(TM) 25-OH VIT D, (D2,D3), LC/MS/MS is recommended: order  code [phone] (patients >59yrs). See Note 1 . Note 1 . For additional information, please refer to  http://education.QuestDiagnostics.com/faq/FAQ199  (This link is being provided for informational/ educational purposes only.)           Passed - Ca in normal range and within 360 days    Calcium  Date Value Ref Range Status  10/07/2019 10.0 8.6 - 10.3 mg/dL Final          Passed - Valid encounter within last 12 months    Recent Outpatient Visits           3 months ago Well adult exam   [facility] Steele Sizer, MD   8 months ago Adult hypothyroidism   [facility] Steele Sizer, MD   1 year ago Encounter for preventive care   [facility] Steele Sizer, MD   1 year ago Adult hypothyroidism   [facility] Steele Sizer, MD   1 year ago Other specified hypothyroidism   [facility] Steele Sizer, MD       Future Appointments             In 9 months Steele Sizer, MD [facility], [facility]

## 2020-06-08 ENCOUNTER — Encounter: Payer: Self-pay | Admitting: Family Medicine

## 2020-06-08 DIAGNOSIS — H43399 Other vitreous opacities, unspecified eye: Secondary | ICD-10-CM

## 2020-06-11 ENCOUNTER — Other Ambulatory Visit: Payer: Self-pay | Admitting: Rheumatology

## 2020-06-27 ENCOUNTER — Other Ambulatory Visit: Payer: Self-pay | Admitting: Family Medicine

## 2020-06-27 DIAGNOSIS — E039 Hypothyroidism, unspecified: Secondary | ICD-10-CM

## 2020-07-05 NOTE — Progress Notes (Signed)
[facility] Note  07/06/2020     CHIEF COMPLAINT Patient presents for Flashes/floaters   HISTORY OF PRESENT ILLNESS: Oscar Allen is a 34 y.o. male who presents to the clinic today for:   HPI    Flashes/floaters    In left eye.  This started 2 years ago.  Duration of 2 years.  Duration Intermittant.  Characterized as ring and small.  Since onset it is stable.  Associated Symptoms Flashes, Floaters and Photophobia.  Context:  distance vision.  Context: similar episodes in past.  Treatments tried include no treatments.  I, the attending physician,  performed the HPI with the patient and updated documentation appropriately.          Comments    34 y/o male pt referred by Dr. Parke Simmers on 06/09/20 for eval of flashes and floaters OS.  Pt states symptoms have been intermittent for about 2 yrs.  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  Pt also had [place] OU about 10 yrs ago.  Symptoms more noticeable when pt looking down.  [facility] [place].  Denies pain.  No gtts.       Last edited by Bernarda Caffey, MD on 07/06/2020 12:15 PM. (History)    pt states about 10 years ago he had a non-traumatic RD OD that had to be repaired with laser (in [place]), he states he is having the same symptoms in his left eye now, he feels like he isn't seeing fol, but more shadow-like figures, he has also had [facility] in [place]), pt takes medication for hypothyroidism, but denies HTN or DM  Referring physician: [place], [place], [place],   [id number]  HISTORICAL INFORMATION:   Selected notes from the [place] Referred by Dr. [place] DeMarco for concern of  LEE:  Ocular Hx- PMH-    CURRENT MEDICATIONS: No current outpatient medications on file. (Ophthalmic Drugs)   No current facility-administered medications for this visit. (Ophthalmic  Drugs)   Current Outpatient Medications (Other)  Medication Sig  . Acetaminophen (TYLENOL) 325 MG CAPS Take 2 capsules by mouth as needed.  Marland Kitchen allopurinol (ZYLOPRIM) 100 MG tablet Take 200 mg by mouth daily.  . Cholecalciferol (VITAMIN D) 50 MCG (2000 UT) CAPS Take 1 capsule (2,000 Units total) by mouth daily.  . colchicine 0.6 MG tablet Take 2 tablets (1.2 mg total) by mouth daily as needed (and repat in one hour if no resolution of pain).  Marland Kitchen ibuprofen (ADVIL) 200 MG tablet Take 2 tablets by mouth as needed.  Marland Kitchen levothyroxine (SYNTHROID) 75 MCG tablet TAKE 1 TABLET (75 MCG TOTAL) BY MOUTH DAILY.  Marland Kitchen Methylcobalamin 1000 MCG SUBL Place 1 tablet under the tongue daily.  . predniSONE (STERAPRED UNI-PAK 21 TAB) 10 MG (21) TBPK tablet As directed  . Vitamin D, Ergocalciferol, (DRISDOL) 1.25 MG (50000 UNIT) CAPS capsule TAKE 1 CAPSULE BY MOUTH EVERY 7 DAYS.   No current facility-administered medications for this visit. (Other)      REVIEW OF SYSTEMS: ROS    Positive for: Eyes   Negative for: Constitutional, Gastrointestinal, Neurological, Skin, Genitourinary, Musculoskeletal, HENT, Endocrine, Cardiovascular, Respiratory, Psychiatric, Allergic/Imm, Heme/Lymph   Last edited by Matthew Folks, COA on 07/06/2020  9:37 AM. (History)       ALLERGIES Allergies  Allergen Reactions  . Rhus Coriaria [[place]] Hives  PAST MEDICAL HISTORY Past Medical History:  Diagnosis Date  . Gout   . Thyroid disorder    Past Surgical History:  Procedure Laterality Date  . EYE SURGERY Bilateral 2012   Laser Eye Surgery  . FOOT SURGERY Right 2000  . LASIK Bilateral 2012   [place] OU  . MANDIBLE FRACTURE SURGERY  2001  . RETINAL DETACHMENT SURGERY Right 2012   Laser for repair of non-traumatic RD  . RHINOPLASTY  2013    FAMILY HISTORY Family History  Problem Relation Age of Onset  . Anxiety disorder Mother   . Rheum arthritis Mother   . Hypothyroidism Father   . Heart Problems Brother   . Rheum  arthritis Maternal Grandmother   . Pancreatic cancer Maternal Grandfather   . Liver cancer Paternal Grandfather   . Glaucoma Paternal Grandfather     SOCIAL HISTORY Social History   Tobacco Use  . Smoking status: Never Smoker  . Smokeless tobacco: Never Used  Vaping Use  . Vaping Use: Never used  Substance Use Topics  . Alcohol use: Yes    Alcohol/week: 0.0 standard drinks    Comment: OCCASIONALLY  . Drug use: No         OPHTHALMIC EXAM:  Base Eye Exam    Visual Acuity (Snellen - Linear)      Right Left   Dist [place] 20/15 +2 20/15       Tonometry (Tonopen, 9:41 AM)      Right Left   Pressure 11 11       Pupils      Dark Light Shape React APD   Right 3 2 Round Brisk None   Left 3 2 Round Brisk None       Visual Fields (Counting fingers)      Left Right    Full Full       Extraocular Movement      Right Left    Full, Ortho Full, Ortho       Neuro/Psych    Oriented x3: Yes   Mood/Affect: Normal       Dilation    Both eyes: 1.0% Mydriacyl, 2.5% Phenylephrine @ 9:41 AM        Slit Lamp and Fundus Exam    Slit Lamp Exam      Right Left   Lids/Lashes Trace Meibomian gland dysfunction Trace Meibomian gland dysfunction   Conjunctiva/Sclera White and quiet White and quiet   Cornea trace Punctate epithelial erosions trace Punctate epithelial erosions   Anterior Chamber Deep and quiet Deep and quiet   Iris Round and dilated Round and dilated   Lens Clear Clear   Vitreous clear clear       Fundus Exam      Right Left   Disc Pink and Sharp, Compact Pink and Sharp, Compact   C/D Ratio 0.1 0.1   Macula Flat, Good foveal reflex, No heme or edema Flat, blunted foveal reflex, No heme or edema   Vessels Normal Normal   Periphery Attached, operculated hole with good laser surrounding at 0700   Attached           Refraction    Manifest Refraction      Sphere Cylinder Axis   Right [place] Sphere 20/15+2   Left [place] Sphere 20/15          IMAGING AND  PROCEDURES  Imaging and Procedures for 07/06/2020  OCT, Retina - OU - Both Eyes       Right Eye  Quality was good. Central Foveal Thickness: 292. Progression has no prior data. Findings include vitreomacular adhesion .   Left Eye Quality was good. Central Foveal Thickness: 291. Progression has no prior data. Findings include normal foveal contour, no IRF, no SRF, vitreomacular adhesion .   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 CME - cystoid macular edema. PED - pigment epithelial detachment. IRF - intraretinal fluid. SRF - subretinal fluid. EZ - ellipsoid zone. ERM - epiretinal membrane. ORA - outer retinal atrophy. ORT - outer retinal tubulation. SRHM - subretinal hyper-reflective material. IRHM - intraretinal hyper-reflective material                 ASSESSMENT/PLAN:    ICD-10-CM   1. Visual disturbances  H53.9   2. 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 History of repair of retinal defect by laser photocoagulation  Z98.890   4. Retinal edema  H35.81 OCT, Retina - OU - Both Eyes  5. History of myopia  Z86.69   6. History of photorefractive keratectomy ([place])  Z98.890     1. Intermittent Visual Disturbances OS   - pt reports 2 year history of intermittent "shadow" in nasal visual field OS  - episodes described as dark splotches lasting 5-15 minutes, occurring 1-2x/wk (inc frequency recently)  - no objective findings on exam to correlate to symptoms  - differential includes ocular migraine  - discussed dx, prognosis and treatment options  - recommend observation and Neurology consult if episodes become debilitating  - f/u here PRN  2,3. History of retinal hole s/p laser retinopexy OD  - operculated hole at 0700 s/p laser in [place]  - good laser surrounding  - stable, monitor  4. No retinal edema on exam or OCT  5,6. Hx  of Myopia  - s/p PRK OU ([place], [place], [place])  - [place] 20/15 OU    Ophthalmic Meds Ordered this visit:  No orders of the defined types were placed in this encounter.      Return if symptoms worsen or fail to improve.  There are no Patient Instructions on file for this visit.   Explained the diagnoses, plan, and follow up with the patient and they expressed understanding.  Patient expressed understanding of the importance of proper follow up care.   This document serves as a record of services personally performed by Gardiner Sleeper, MD, PhD. It was created on their behalf by San Jetty. Owens Shark, COT, an ophthalmic technician. The creation of this record is the provider's dictation and/or activities during the visit.    Electronically signed by: San Jetty. Owens Shark, [place] 07.08.2021 12:18 PM   Gardiner Sleeper, M.D., Ph.D. Diseases & Surgery of the Retina and Vitreous [facility]  I have reviewed the above documentation for accuracy and completeness, and I agree with the above. Gardiner Sleeper, M.D., Ph.D. 07/06/20 12:18 PM   Abbreviations: M myopia (nearsighted); A astigmatism; H hyperopia (farsighted); P presbyopia; Mrx spectacle prescription;  CTL contact lenses; OD right eye; OS left eye; OU both eyes  XT exotropia; ET esotropia; PEK punctate epithelial keratitis; PEE punctate epithelial erosions; DES dry eye syndrome; MGD meibomian gland dysfunction; ATs artificial tears; PFAT's preservative free artificial tears; [place] nuclear sclerotic cataract; PSC posterior subcapsular cataract; ERM epi-retinal membrane; PVD posterior vitreous detachment; RD retinal  detachment; DM diabetes mellitus; DR diabetic retinopathy; NPDR non-proliferative diabetic retinopathy; PDR proliferative diabetic retinopathy; CSME clinically significant macular edema; DME diabetic macular edema; dbh dot blot hemorrhages; CWS cotton wool spot; POAG primary open angle glaucoma; C/D cup-to-disc  ratio; HVF humphrey visual field; GVF goldmann visual field; OCT optical coherence tomography; IOP intraocular pressure; BRVO Branch retinal vein occlusion; CRVO central retinal vein occlusion; CRAO central retinal artery occlusion; BRAO branch retinal artery occlusion; RT retinal tear; SB scleral buckle; PPV pars plana vitrectomy; VH Vitreous hemorrhage; PRP panretinal laser photocoagulation; IVK intravitreal kenalog; VMT vitreomacular traction; MH Macular hole;  NVD neovascularization of the disc; NVE neovascularization elsewhere; AREDS age related eye disease study; ARMD age related macular degeneration; POAG primary open angle glaucoma; EBMD epithelial/anterior basement membrane dystrophy; ACIOL anterior chamber intraocular lens; IOL intraocular lens; PCIOL posterior chamber intraocular lens; Phaco/IOL phacoemulsification with intraocular lens placement; [place] photorefractive keratectomy; LASIK laser assisted in situ keratomileusis; HTN hypertension; DM diabetes mellitus; COPD chronic obstructive pulmonary disease

## 2020-07-06 ENCOUNTER — Other Ambulatory Visit: Payer: Self-pay

## 2020-07-06 ENCOUNTER — Ambulatory Visit (INDEPENDENT_AMBULATORY_CARE_PROVIDER_SITE_OTHER): Payer: No Typology Code available for payment source | Admitting: Ophthalmology

## 2020-07-06 ENCOUNTER — Encounter (INDEPENDENT_AMBULATORY_CARE_PROVIDER_SITE_OTHER): Payer: Self-pay | Admitting: Ophthalmology

## 2020-07-06 DIAGNOSIS — Z9889 Other specified postprocedural states: Secondary | ICD-10-CM

## 2020-07-06 DIAGNOSIS — H33321 Round hole, right eye: Secondary | ICD-10-CM

## 2020-07-06 DIAGNOSIS — H3581 Retinal edema: Secondary | ICD-10-CM

## 2020-07-06 DIAGNOSIS — H539 Unspecified visual disturbance: Secondary | ICD-10-CM | POA: Diagnosis not present

## 2020-07-06 DIAGNOSIS — Z8669 Personal history of other diseases of the nervous system and sense organs: Secondary | ICD-10-CM

## 2020-07-09 ENCOUNTER — Encounter (INDEPENDENT_AMBULATORY_CARE_PROVIDER_SITE_OTHER): Payer: 59 | Admitting: Ophthalmology

## 2020-07-09 ENCOUNTER — Other Ambulatory Visit: Payer: Self-pay | Admitting: Family Medicine

## 2020-07-09 DIAGNOSIS — E559 Vitamin D deficiency, unspecified: Secondary | ICD-10-CM

## 2020-07-09 NOTE — Telephone Encounter (Signed)
Requested medication (s) are due for refill today: yes  Requested medication (s) are on the active medication list: yes  Last refill:  04/17/2020  Future visit scheduled: yes  Notes to clinic: 50,000 IU strengths are not delegated   Requested Prescriptions  Pending Prescriptions Disp Refills   Vitamin D, Ergocalciferol, (DRISDOL) 1.25 MG (50000 UNIT) CAPS capsule [Pharmacy Med Name: VIT D2 1.25 MG (50,000 UNIT 1.25 MG Capsule] 12 capsule 1    Sig: TAKE 1 CAPSULE BY MOUTH EVERY 7 DAYS.      Endocrinology:  Vitamins - Vitamin D Supplementation Failed - 07/09/2020  9:12 AM      Failed - 50,000 IU strengths are not delegated      Failed - Phosphate in normal range and within 360 days    No results found for: PHOS        Failed - Vitamin D in normal range and within 360 days    Vit D, 25-Hydroxy  Date Value Ref Range Status  10/07/2019 24 (L) 30 - 100 ng/mL Final    Comment:    Vitamin D Status         25-OH Vitamin D: . Deficiency:                    <20 ng/mL Insufficiency:             20 - 29 ng/mL Optimal:                 > or = 30 ng/mL . For 25-OH Vitamin D testing on patients on  D2-supplementation and patients for whom quantitation  of D2 and D3 fractions is required, the QuestAssureD(TM) 25-OH VIT D, (D2,D3), LC/MS/MS is recommended: order  code [phone] (patients >6yrs). See Note 1 . Note 1 . For additional information, please refer to  http://education.QuestDiagnostics.com/faq/FAQ199  (This link is being provided for informational/ educational purposes only.)           Passed - Ca in normal range and within 360 days    Calcium  Date Value Ref Range Status  10/07/2019 10.0 8.6 - 10.3 mg/dL Final          Passed - Valid encounter within last 12 months    Recent Outpatient Visits           9 months ago Well adult exam   [facility] Steele Sizer, MD   1 year ago Adult hypothyroidism   [facility] Steele Sizer, MD   1 year ago Encounter for preventive care   [facility] Steele Sizer, MD   2 years ago Adult hypothyroidism   [facility] Steele Sizer, MD   2 years ago Other specified hypothyroidism   [facility] Steele Sizer, MD       Future Appointments             In 3 months Ancil Boozer, Drue Stager, MD [facility], [facility]

## 2020-09-24 ENCOUNTER — Encounter: Payer: Self-pay | Admitting: Family Medicine

## 2020-10-12 ENCOUNTER — Encounter: Payer: No Typology Code available for payment source | Admitting: Family Medicine

## 2020-11-04 ENCOUNTER — Encounter: Payer: Self-pay | Admitting: Family Medicine

## 2020-11-05 ENCOUNTER — Other Ambulatory Visit: Payer: Self-pay | Admitting: Family Medicine

## 2020-11-05 ENCOUNTER — Other Ambulatory Visit: Payer: Self-pay

## 2020-11-05 DIAGNOSIS — Z1283 Encounter for screening for malignant neoplasm of skin: Secondary | ICD-10-CM

## 2020-11-15 ENCOUNTER — Other Ambulatory Visit: Payer: Self-pay | Admitting: Dermatology

## 2020-11-15 ENCOUNTER — Other Ambulatory Visit: Payer: Self-pay

## 2020-11-15 ENCOUNTER — Ambulatory Visit: Payer: No Typology Code available for payment source | Admitting: Dermatology

## 2020-11-15 DIAGNOSIS — L219 Seborrheic dermatitis, unspecified: Secondary | ICD-10-CM | POA: Diagnosis not present

## 2020-11-15 DIAGNOSIS — Z1283 Encounter for screening for malignant neoplasm of skin: Secondary | ICD-10-CM

## 2020-11-15 DIAGNOSIS — L720 Epidermal cyst: Secondary | ICD-10-CM | POA: Diagnosis not present

## 2020-11-15 DIAGNOSIS — L814 Other melanin hyperpigmentation: Secondary | ICD-10-CM | POA: Diagnosis not present

## 2020-11-15 DIAGNOSIS — L821 Other seborrheic keratosis: Secondary | ICD-10-CM

## 2020-11-15 DIAGNOSIS — D229 Melanocytic nevi, unspecified: Secondary | ICD-10-CM

## 2020-11-15 DIAGNOSIS — L578 Other skin changes due to chronic exposure to nonionizing radiation: Secondary | ICD-10-CM

## 2020-11-15 DIAGNOSIS — D1800 Hemangioma unspecified site: Secondary | ICD-10-CM

## 2020-11-15 MED ORDER — KETOCONAZOLE 2 % EX CREA: 1.0000 "application " | TOPICAL_CREAM | Freq: Every day | CUTANEOUS | 5 refills | Status: DC | PRN

## 2020-11-15 NOTE — Patient Instructions (Signed)

## 2020-11-15 NOTE — Progress Notes (Signed)
   Follow-Up Visit   Subjective  Early Oscar Allen is a 34 y.o. male who presents for the following: FBSE.  Patient here for full body skin exam and skin cancer screening. No personal history of skin cancer. His grandmother did have skin cancer. Patient has used tanning beds in the past. Patient not aware of any new or changing spots.  Patient does use ketoconazole 2% cream as needed for seb derm around the nose.   The following portions of the chart were reviewed this encounter and updated as appropriate:  Tobacco  Allergies  Meds  Problems  Med Hx  Surg Hx  Fam Hx      Review of Systems:  No other skin or systemic complaints except as noted in HPI or Assessment and Plan.  Objective  Well appearing patient in no apparent distress; mood and affect are within normal limits.  A full examination was performed including scalp, head, eyes, ears, nose, lips, neck, chest, axillae, abdomen, back, buttocks, bilateral upper extremities, bilateral lower extremities, hands, feet, fingers, toes, fingernails, and toenails. All findings within normal limits unless otherwise noted below.  Objective  Left Upper Back: Subcutaneous nodule.   Objective  Nose: Minimal erythema at nasolabial folds   Assessment & Plan  Epidermal inclusion cyst Left Upper Back  Benign-appearing.  Observation.  Call clinic for new or changing lesions.  Reviewed option of excision if this becomes symptomatic   Seborrheic dermatitis Nose  Chronic, well controlled   Cont ketoconazole 2% cream 1-2 times daily to affected areas as needed. Refill sent.  Ordered Medications: ketoconazole (NIZORAL) 2 % cream   Lentigines - Scattered tan macules - Discussed due to sun exposure - Benign, observe - Call for any changes  Seborrheic Keratoses - Stuck-on, waxy, tan-brown papules and plaques  - Discussed benign etiology and prognosis. - Observe - Call for any changes  Melanocytic Nevi - Tan-brown  and/or pink-flesh-colored symmetric macules and papules - Benign appearing on exam today - Observation - Call clinic for new or changing moles - Recommend daily use of broad spectrum spf 30+ sunscreen to sun-exposed areas.   Hemangiomas - Red papules - Discussed benign nature - Observe - Call for any changes  Actinic Damage - Chronic, secondary to cumulative UV/sun exposure - diffuse scaly erythematous macules with underlying dyspigmentation - Recommend daily broad spectrum sunscreen SPF 30+ to sun-exposed areas, reapply every 2 hours as needed.  - Call for new or changing lesions.  Skin cancer screening performed today.   Return in about 2 years (around 11/15/2022) for TBSE.  Graciella Belton, RMA, am acting as scribe for Forest Gleason, MD .  Documentation: I have reviewed the above documentation for accuracy and completeness, and I agree with the above.  Forest Gleason, MD

## 2020-11-16 ENCOUNTER — Other Ambulatory Visit: Payer: Self-pay | Admitting: Family Medicine

## 2020-11-16 ENCOUNTER — Encounter: Payer: Self-pay | Admitting: Family Medicine

## 2020-11-16 ENCOUNTER — Ambulatory Visit (INDEPENDENT_AMBULATORY_CARE_PROVIDER_SITE_OTHER): Payer: No Typology Code available for payment source | Admitting: Family Medicine

## 2020-11-16 VITALS — BP 112/72 | HR 62 | Temp 98.4°F | Resp 14 | Ht 70.0 in | Wt 210.0 lb

## 2020-11-16 DIAGNOSIS — E538 Deficiency of other specified B group vitamins: Secondary | ICD-10-CM

## 2020-11-16 DIAGNOSIS — Z0000 Encounter for general adult medical examination without abnormal findings: Secondary | ICD-10-CM

## 2020-11-16 DIAGNOSIS — E559 Vitamin D deficiency, unspecified: Secondary | ICD-10-CM

## 2020-11-16 DIAGNOSIS — Z131 Encounter for screening for diabetes mellitus: Secondary | ICD-10-CM

## 2020-11-16 DIAGNOSIS — E039 Hypothyroidism, unspecified: Secondary | ICD-10-CM

## 2020-11-16 DIAGNOSIS — E785 Hyperlipidemia, unspecified: Secondary | ICD-10-CM

## 2020-11-16 DIAGNOSIS — M1A0710 Idiopathic chronic gout, right ankle and foot, without tophus (tophi): Secondary | ICD-10-CM | POA: Diagnosis not present

## 2020-11-16 DIAGNOSIS — Z23 Encounter for immunization: Secondary | ICD-10-CM

## 2020-11-16 MED ORDER — VITAMIN D (ERGOCALCIFEROL) 1.25 MG (50000 UNIT) PO CAPS: 50000.0000 [IU] | ORAL_CAPSULE | ORAL | 3 refills | Status: DC

## 2020-11-16 NOTE — Patient Instructions (Signed)
Preventive Care 19-35 Years Old, Male Preventive care refers to lifestyle choices and visits with your health care provider that can promote health and wellness. This includes:  A yearly physical exam. This is also called an annual well check.  Regular dental and eye exams.  Immunizations.  Screening for certain conditions.  Healthy lifestyle choices, such as eating a healthy diet, getting regular exercise, not using drugs or products that contain nicotine and tobacco, and limiting alcohol use. What can I expect for my preventive care visit? Physical exam Your health care provider will check:  Height and weight. These may be used to calculate body mass index (BMI), which is a measurement that tells if you are at a healthy weight.  Heart rate and blood pressure.  Your skin for abnormal spots. Counseling Your health care provider may ask you questions about:  Alcohol, tobacco, and drug use.  Emotional well-being.  Home and relationship well-being.  Sexual activity.  Eating habits.  Work and work Statistician. What immunizations do I need?  Influenza (flu) vaccine  This is recommended every year. Tetanus, diphtheria, and pertussis (Tdap) vaccine  You may need a Td booster every 10 years. Varicella (chickenpox) vaccine  You may need this vaccine if you have not already been vaccinated. Human papillomavirus (HPV) vaccine  If recommended by your health care provider, you may need three doses over 6 months. Measles, mumps, and rubella (MMR) vaccine  You may need at least one dose of MMR. You may also need a second dose. Meningococcal conjugate (MenACWY) vaccine  One dose is recommended if you are 45-76 years old and a Market researcher living in a residence hall, or if you have one of several medical conditions. You may also need additional booster doses. Pneumococcal conjugate (PCV13) vaccine  You may need this if you have certain conditions and were not  previously vaccinated. Pneumococcal polysaccharide (PPSV23) vaccine  You may need one or two doses if you smoke cigarettes or if you have certain conditions. Hepatitis A vaccine  You may need this if you have certain conditions or if you travel or work in places where you may be exposed to hepatitis A. Hepatitis B vaccine  You may need this if you have certain conditions or if you travel or work in places where you may be exposed to hepatitis B. Haemophilus influenzae type b (Hib) vaccine  You may need this if you have certain risk factors. You may receive vaccines as individual doses or as more than one vaccine together in one shot (combination vaccines). Talk with your health care provider about the risks and benefits of combination vaccines. What tests do I need? Blood tests  Lipid and cholesterol levels. These may be checked every 5 years starting at age 17.  Hepatitis C test.  Hepatitis B test. Screening   Diabetes screening. This is done by checking your blood sugar (glucose) after you have not eaten for a while (fasting).  Sexually transmitted disease (STD) testing. Talk with your health care provider about your test results, treatment options, and if necessary, the need for more tests. Follow these instructions at home: Eating and drinking   Eat a diet that includes fresh fruits and vegetables, whole grains, lean protein, and low-fat dairy products.  Take vitamin and mineral supplements as recommended by your health care provider.  Do not drink alcohol if your health care provider tells you not to drink.  If you drink alcohol: ? Limit how much you have to 0-2  drinks a day. ? Be aware of how much alcohol is in your drink. In the [place] one drink equals one 12 oz bottle of beer (355 mL), one 5 oz glass of wine (148 mL), or one 1 oz glass of hard liquor (44 mL). Lifestyle  Take daily care of your teeth and gums.  Stay active. Exercise for at least 30 minutes on 5 or  more days each week.  Do not use any products that contain nicotine or tobacco, such as cigarettes, e-cigarettes, and chewing tobacco. If you need help quitting, ask your health care provider.  If you are sexually active, practice safe sex. Use a condom or other form of protection to prevent STIs (sexually transmitted infections). What's next?  Go to your health care provider once a year for a well check visit.  Ask your health care provider how often you should have your eyes and teeth checked.  Stay up to date on all vaccines. This information is not intended to replace advice given to you by your health care provider. Make sure you discuss any questions you have with your health care provider. Document Revised: 12/09/2018 Document Reviewed: 12/09/2018 Elsevier Patient Education  2020 [employer].

## 2020-11-16 NOTE — Progress Notes (Signed)
Name: Oscar Allen   MRN: [id number]    DOB: April 09, 1986   Date:11/16/2020       Progress Note  Subjective  Chief Complaint  Annual Exam and follow up   HPI  Patient presents for annual CPE and follow up  Chronic right ankle pain: it was present for months, seen by Ortho and had MRI that was unremarkable, finally saw Rheumatologist Dr. Lenetta Quaker and was started on allopurinol bid and colchine daily, he is doing well no recent pain on ankle  Hypothyroidism: he states not longer feeling as tired, weight is trending up but he states he has been snacking more . He is compliant with medication, we will recheck TSH today   Snoring/interrupted sleep: he had a high ESS - 14- in our office, also states father and younger brother have sleep apnea. He had a sleep study and it was negative .  Obesity: he tried Contrave, but did not noticed any change in his appetite, also tool Belviq for the past 6 weeks and he gained weight. He states since he started on Qsymia 11/2016, initial weight of 251 lbs and he went as low as 198 lbs,currently off medication and weight has been stable around 200 lbs  Insomnia: he is able to fall asleep but wakes up a couple of hours later and has difficulty to fall back asleep after that. Advised to flip his clock backwards   Mild left shoulder pain: intermittent, over the past 4 weeks, no change in activity , usually on posterior shoulder, advised to avoid activities that aggravates   Vitamin D : he is taking supplementation   Vitamin B12: he is using otc supplementation   IPSS Questionnaire (AUA-7): Over the past month   1)  How often have you had a sensation of not emptying your bladder completely after you finish urinating?  0 - Not at all  2)  How often have you had to urinate again less than two hours after you finished urinating? 0 - Not at all  3)  How often have you found you stopped and started again several times when you urinated?  0 - Not at  all  4) How difficult have you found it to postpone urination?  0 - Not at all  5) How often have you had a weak urinary stream?  0 - Not at all  6) How often have you had to push or strain to begin urination?  0 - Not at all  7) How many times did you most typically get up to urinate from the time you went to bed until the time you got up in the morning?  0 - None  Total score:  0-7 mildly symptomatic   8-19 moderately symptomatic   20-35 severely symptomatic     Diet: balanced diet, packs his lunch, intermittent fasting  Exercise: he has been working out 4 times per week   Depression: phq 9 is positive Depression screen [facility] 2/9 11/16/2020 10/07/2019 05/13/2019 09/24/2018 07/09/2018  Decreased Interest 0 0 0 0 0  Down, Depressed, Hopeless 0 0 0 0 0  PHQ - 2 Score 0 0 0 0 0  Altered sleeping 2 1 3  - -  Tired, decreased energy 1 1 3  - -  Change in appetite 1 0 0 - -  Feeling bad or failure about yourself  0 0 0 - -  Trouble concentrating 0 0 0 - -  Moving slowly or fidgety/restless 0 0 0 - -  Suicidal thoughts 0 0 0 - -  PHQ-9 Score 4 2 6  - -  Difficult doing work/chores Not difficult at all Not difficult at all Not difficult at all - -    Hypertension:  BP Readings from Last 3 Encounters:  11/16/20 112/72  10/07/19 114/72  05/13/19 120/84    Obesity: Wt Readings from Last 3 Encounters:  11/16/20 210 lb (95.3 kg)  10/07/19 201 lb 3.2 oz (91.3 kg)  05/13/19 203 lb 8 oz (92.3 kg)   BMI Readings from Last 3 Encounters:  11/16/20 30.13 kg/m  10/07/19 29.50 kg/m  05/13/19 29.20 kg/m     Lipids:  Lab Results  Component Value Date   CHOL 227 (H) 10/07/2019   CHOL 161 09/24/2018   CHOL 200 (H) 09/18/2017   Lab Results  Component Value Date   HDL 54 10/07/2019   HDL 41 09/24/2018   HDL 58 09/18/2017   Lab Results  Component Value Date   LDLCALC 145 (H) 10/07/2019   LDLCALC 102 (H) 09/24/2018   LDLCALC 120 (H) 09/18/2017   Lab Results  Component Value Date    TRIG 146 10/07/2019   TRIG 87 09/24/2018   TRIG 108 09/18/2017   Lab Results  Component Value Date   CHOLHDL 4.2 10/07/2019   CHOLHDL 3.9 09/24/2018   CHOLHDL 3.4 09/18/2017   No results found for: LDLDIRECT Glucose:  Glucose, Bld  Date Value Ref Range Status  10/07/2019 88 65 - 99 mg/dL Final    Comment:    .            Fasting reference interval .   09/24/2018 81 65 - 99 mg/dL Final    Comment:    .            Fasting reference interval .   09/18/2017 83 65 - 99 mg/dL Final    Comment:    .            Fasting reference interval .       Office Visit from 11/16/2020 in [facility]  AUDIT-C Score 2       Married STD testing and prevention (HIV/chl/gon/syphilis): not interested  Hep C: 10/07/2019  Skin cancer: Discussed monitoring for atypical lesions, sees endocrinologist  Colorectal cancer: N/A Prostate cancer: N/A   ECG:  N/A  Vaccines:   HPV: up to at age 70 , ask insurance if age between 43-45  Pneumonia:  educated and discussed with patient. Flu: Given at today's visit, educated and discussed with patient.  Advanced Care Planning: A voluntary discussion about advance care planning including the explanation and discussion of advance directives.  Discussed health care proxy and Living will, and the patient was able to identify a health care proxy as wife .  Patient does not have a living will at present time.  Patient Active Problem List   Diagnosis Date Noted   Family history of rheumatoid arthritis 07/28/2018   Hyperuricemia 07/28/2018   Vitreous floater, bilateral 05/21/2018   Dry eye syndrome of both lacrimal glands 05/21/2018   Peroneal tendinitis 02/12/2018   Overweight (BMI 25.0-29.9) 09/18/2017   Bone spur of left ankle 03/28/2016   Insomnia, persistent 08/26/2015   History of hypertension 08/26/2015   Adult hypothyroidism 08/26/2015   B12 deficiency 08/26/2015   Gout of big toe 08/26/2015   Vitamin D  deficiency 08/26/2015    Past Surgical History:  Procedure Laterality Date   EYE SURGERY Bilateral 2012   [place]  [facility]   FOOT SURGERY Right 2000   LASIK Bilateral 2012   [place] [facility]   MANDIBLE FRACTURE SURGERY  2001   RETINAL DETACHMENT SURGERY Right 2012   Laser for repair of non-traumatic RD   RHINOPLASTY  2013    Family History  Problem Relation Age of Onset   Anxiety disorder Mother    Rheum arthritis Mother    Hypothyroidism Father    Heart Problems Brother    Rheum arthritis Maternal Grandmother    Pancreatic cancer Maternal Grandfather    Liver cancer Paternal Grandfather    Glaucoma Paternal Grandfather     Social History   Socioeconomic History   Marital status: Married    Spouse name: Barista   Number of children: 0   Years of education: Not on file   Highest education level: Doctorate  Occupational History   Occupation: PT  Tobacco Use   Smoking status: Never Smoker   Smokeless tobacco: Never Used  Scientific laboratory technician Use: Never used  Substance and Sexual Activity   Alcohol use: Yes    Alcohol/week: 0.0 standard drinks    Comment: OCCASIONALLY   Drug use: No   Sexual activity: Yes    Partners: Female  Other Topics Concern   Not on file  Social History Narrative   Not on file   Social Determinants of Health   Financial Resource Strain: Low Risk    Difficulty of Paying Living Expenses: Not hard at all  Food Insecurity: No Food Insecurity   Worried About Charity fundraiser in the Last Year: Never true   [place] in the Last Year: Never true  Transportation Needs: No Transportation Needs   Lack of Transportation (Medical): No   Lack of Transportation (Non-Medical): No  Physical Activity: Sufficiently Active   Days of Exercise per Week: 4 days   Minutes of Exercise per Session: 50 min  Stress: No Stress Concern Present   Feeling of Stress : Not at all  Social Connections: Moderately Isolated    Frequency of Communication with Friends and Family: Once a week   Frequency of Social Gatherings with Friends and Family: Once a week   Attends Religious Services: More than 4 times per year   Active Member of [employer] or Organizations: No   Attends Music therapist: Never   Marital Status: Married  Human resources officer Violence: Not At Risk   Fear of Current or Ex-Partner: No   Emotionally Abused: No   Physically Abused: No   Sexually Abused: No     Current Outpatient Medications:    Acetaminophen (TYLENOL) 325 MG CAPS, Take 2 capsules by mouth as needed., Disp: , Rfl:    allopurinol (ZYLOPRIM) 100 MG tablet, Take 200 mg by mouth daily., Disp: , Rfl:    colchicine 0.6 MG tablet, Take 2 tablets (1.2 mg total) by mouth daily as needed (and repat in one hour if no resolution of pain)., Disp: 30 tablet, Rfl: 0   ibuprofen (ADVIL) 200 MG tablet, Take 2 tablets by mouth as needed., Disp: , Rfl:    levothyroxine (SYNTHROID) 75 MCG tablet, TAKE 1 TABLET (75 MCG TOTAL) BY MOUTH DAILY., Disp: 90 tablet, Rfl: 1   Methylcobalamin 1000 MCG SUBL, Place 1 tablet under the tongue daily., Disp: 30 tablet, Rfl: 0   ketoconazole (NIZORAL) 2 % cream, Apply 1 application topically daily as needed for irritation. (Patient not taking: Reported on 11/16/2020), Disp: 30 g, Rfl: 5  Vitamin D, Ergocalciferol, (DRISDOL) 1.25 MG (50000 UNIT) CAPS capsule, Take 1 capsule (50,000 Units total) by mouth every 7 (seven) days., Disp: 12 capsule, Rfl: 3  Allergies  Allergen Reactions   Rhus Coriaria [[place]] Hives     ROS  Constitutional: Negative for fever or significant weight change.  Respiratory: Negative for cough and shortness of breath.   Cardiovascular: Negative for chest pain or palpitations.  Gastrointestinal: Negative for abdominal pain, no bowel changes.  Musculoskeletal: Negative for gait problem or joint swelling.  Skin: Negative for rash.  Neurological: Negative for  dizziness or headache.  No other specific complaints in a complete review of systems (except as listed in HPI above).  Objective  Vitals:   11/16/20 1349  BP: 112/72  Pulse: 62  Resp: 14  Temp: 98.4 F (36.9 C)  TempSrc: Oral  SpO2: 99%  Weight: 210 lb (95.3 kg)  Height: 5\' 10"  (1.778 m)    Body mass index is 30.13 kg/m.  Physical Exam  Constitutional: Patient appears well-developed  No distress.  HENT: Head: Normocephalic and atraumatic. Ears: B TMs ok, no erythema or effusion; Nose: Not done. Mouth/Throat: not done  Eyes: Conjunctivae and EOM are normal. Pupils are equal, round, and reactive to light. No scleral icterus.  Neck: Normal range of motion. Neck supple. No JVD present. No thyromegaly present.  Cardiovascular: Normal rate, regular rhythm and normal heart sounds.  No murmur heard. No BLE edema. Pulmonary/Chest: Effort normal and breath sounds normal. No respiratory distress. Abdominal: Soft. Bowel sounds are normal, no distension. There is no tenderness. no masses MALE GENITALIA: Normal descended testes bilaterally, no masses palpated, no hernias, no lesions, no discharge RECTAL: not done  Musculoskeletal: Normal range of motion, no joint effusions. No gross deformities Neurological: he is alert and oriented to person, place, and time. No cranial nerve deficit. Coordination, balance, strength, speech and gait are normal.  Skin: Skin is warm and dry. No rash noted. No erythema.  Psychiatric: Patient has a normal mood and affect. behavior is normal. Judgment and thought content normal.  Fall Risk: Fall Risk  11/16/2020 10/07/2019 05/13/2019 09/24/2018 07/09/2018  Falls in the past year? 0 0 0 No No  Number falls in past yr: 0 0 0 - -  Injury with Fall? 0 0 0 - -    Functional Status Survey: Is the patient deaf or have difficulty hearing?: No Does the patient have difficulty seeing, even when wearing glasses/contacts?: No Does the patient have difficulty  concentrating, remembering, or making decisions?: No Does the patient have difficulty walking or climbing stairs?: No Does the patient have difficulty dressing or bathing?: No Does the patient have difficulty doing errands alone such as visiting a doctor's office or shopping?: No    Assessment & Plan  1. Well adult exam  - Lipid panel - CBC with Differential/Platelet - COMPLETE METABOLIC PANEL WITH GFR - TSH - Hemoglobin A1c - VITAMIN D 25 Hydroxy (Vit-D Deficiency, Fractures) - Uric acid - Vitamin B12  2. Adult hypothyroidism  - TSH  3. B12 deficiency  - CBC with Differential/Platelet - Vitamin B12  4. Vitamin D deficiency  - VITAMIN D 25 Hydroxy (Vit-D Deficiency, Fractures) - Vitamin D, Ergocalciferol, (DRISDOL) 1.25 MG (50000 UNIT) CAPS capsule; Take 1 capsule (50,000 Units total) by mouth every 7 (seven) days.  Dispense: 12 capsule; Refill: 3  5. Chronic gout of right ankle, unspecified cause  - Uric acid  6. Diabetes mellitus screening  - Hemoglobin A1c  7.  Dyslipidemia  - Lipid panel  8. Need for hepatitis A vaccination  - Hepatitis A vaccine adult IM   -Prostate cancer screening and PSA options (with potential risks and benefits of testing vs not testing) were discussed along with recent recs/guidelines. -USPSTF grade A and B recommendations reviewed with patient; age-appropriate recommendations, preventive care, screening tests, etc discussed and encouraged; healthy living encouraged; see AVS for patient education given to patient -Discussed importance of 150 minutes of physical activity weekly, eat two servings of fish weekly, eat one serving of tree nuts ( cashews, pistachios, pecans, almonds.Marland Kitchen) every other day, eat 6 servings of fruit/vegetables daily and drink plenty of water and avoid sweet beverages.

## 2020-11-17 ENCOUNTER — Encounter: Payer: Self-pay | Admitting: Dermatology

## 2020-11-17 LAB — HEMOGLOBIN A1C
Hgb A1c MFr Bld: 5 % of total Hgb (ref <5.7)
Mean Plasma Glucose: 97 (calc)
eAG (mmol/L): 5.4 (calc)

## 2020-11-17 LAB — URIC ACID: Uric Acid, Serum: 7 mg/dL (ref 4.0–8.0)

## 2020-11-17 LAB — CBC WITH DIFFERENTIAL/PLATELET
Absolute Monocytes: 378 cells/uL (ref 200–950)
Basophils Absolute: 63 cells/uL (ref 0–200)
Basophils Relative: 1 %
Eosinophils Absolute: 139 cells/uL (ref 15–500)
Eosinophils Relative: 2.2 %
HCT: 45.4 % (ref 38.5–50.0)
Hemoglobin: 15 g/dL (ref 13.2–17.1)
Lymphs Abs: 2520 cells/uL (ref 850–3900)
MCH: 27.5 pg (ref 27.0–33.0)
MCHC: 33 g/dL (ref 32.0–36.0)
MCV: 83.3 fL (ref 80.0–100.0)
MPV: 10.5 fL (ref 7.5–12.5)
Monocytes Relative: 6 %
Neutro Abs: 3200 cells/uL (ref 1500–7800)
Neutrophils Relative %: 50.8 %
Platelets: 282 10*3/uL (ref 140–400)
RBC: 5.45 10*6/uL (ref 4.20–5.80)
RDW: 13 % (ref 11.0–15.0)
Total Lymphocyte: 40 %
WBC: 6.3 10*3/uL (ref 3.8–10.8)

## 2020-11-17 LAB — COMPLETE METABOLIC PANEL WITH GFR
AG Ratio: 2.4 (calc) (ref 1.0–2.5)
ALT: 30 U/L (ref 9–46)
AST: 21 U/L (ref 10–40)
Albumin: 5.1 g/dL (ref 3.6–5.1)
Alkaline phosphatase (APISO): 34 U/L — ABNORMAL LOW (ref 36–130)
BUN: 8 mg/dL (ref 7–25)
CO2: 28 mmol/L (ref 20–32)
Calcium: 9.9 mg/dL (ref 8.6–10.3)
Chloride: 103 mmol/L (ref 98–110)
Creat: 0.86 mg/dL (ref 0.60–1.35)
GFR, Est African American: 130 mL/min/{1.73_m2} (ref >=60)
GFR, Est Non African American: 112 mL/min/{1.73_m2} (ref >=60)
Globulin: 2.1 g/dL (calc) (ref 1.9–3.7)
Glucose, Bld: 79 mg/dL (ref 65–99)
Potassium: 3.9 mmol/L (ref 3.5–5.3)
Sodium: 141 mmol/L (ref 135–146)
Total Bilirubin: 1.2 mg/dL (ref 0.2–1.2)
Total Protein: 7.2 g/dL (ref 6.1–8.1)

## 2020-11-17 LAB — LIPID PANEL
Cholesterol: 209 mg/dL — ABNORMAL HIGH (ref <200)
HDL: 51 mg/dL (ref >=40)
LDL Cholesterol (Calc): 138 mg/dL (calc) — ABNORMAL HIGH
Non-HDL Cholesterol (Calc): 158 mg/dL (calc) — ABNORMAL HIGH (ref <130)
Total CHOL/HDL Ratio: 4.1 (calc) (ref <5.0)
Triglycerides: 94 mg/dL (ref <150)

## 2020-11-17 LAB — VITAMIN B12: Vitamin B-12: 443 pg/mL (ref 200–1100)

## 2020-11-17 LAB — TSH: TSH: 2.27 mIU/L (ref 0.40–4.50)

## 2020-11-17 LAB — VITAMIN D 25 HYDROXY (VIT D DEFICIENCY, FRACTURES): Vit D, 25-Hydroxy: 79 ng/mL (ref 30–100)

## 2020-11-30 ENCOUNTER — Other Ambulatory Visit: Payer: Self-pay | Admitting: Rheumatology

## 2020-12-31 ENCOUNTER — Other Ambulatory Visit: Payer: Self-pay | Admitting: Family Medicine

## 2020-12-31 DIAGNOSIS — E039 Hypothyroidism, unspecified: Secondary | ICD-10-CM

## 2021-01-18 ENCOUNTER — Encounter: Payer: No Typology Code available for payment source | Admitting: Family Medicine

## 2021-04-03 ENCOUNTER — Ambulatory Visit: Payer: No Typology Code available for payment source | Admitting: Dermatology

## 2021-04-15 MED FILL — Levothyroxine Sodium Tab 75 MCG: ORAL | 90 days supply | Qty: 90 | Fill #0 | Status: AC

## 2021-04-16 ENCOUNTER — Other Ambulatory Visit: Payer: Self-pay

## 2021-05-17 ENCOUNTER — Other Ambulatory Visit: Payer: Self-pay

## 2021-05-17 ENCOUNTER — Ambulatory Visit (INDEPENDENT_AMBULATORY_CARE_PROVIDER_SITE_OTHER): Payer: No Typology Code available for payment source | Admitting: Family Medicine

## 2021-05-17 ENCOUNTER — Encounter: Payer: Self-pay | Admitting: Family Medicine

## 2021-05-17 VITALS — BP 134/70 | HR 67 | Temp 98.2°F | Resp 16 | Ht 70.0 in | Wt 201.0 lb

## 2021-05-17 DIAGNOSIS — E785 Hyperlipidemia, unspecified: Secondary | ICD-10-CM

## 2021-05-17 DIAGNOSIS — E538 Deficiency of other specified B group vitamins: Secondary | ICD-10-CM

## 2021-05-17 DIAGNOSIS — E039 Hypothyroidism, unspecified: Secondary | ICD-10-CM | POA: Diagnosis not present

## 2021-05-17 DIAGNOSIS — E559 Vitamin D deficiency, unspecified: Secondary | ICD-10-CM | POA: Diagnosis not present

## 2021-05-17 DIAGNOSIS — Z302 Encounter for sterilization: Secondary | ICD-10-CM | POA: Diagnosis not present

## 2021-05-17 DIAGNOSIS — M1A0710 Idiopathic chronic gout, right ankle and foot, without tophus (tophi): Secondary | ICD-10-CM

## 2021-05-17 DIAGNOSIS — Z8639 Personal history of other endocrine, nutritional and metabolic disease: Secondary | ICD-10-CM

## 2021-05-17 NOTE — Progress Notes (Signed)
Name: Oscar Allen   MRN: [id number]    DOB: 01-15-1986   Date:05/17/2021       Progress Note  Subjective  Chief Complaint  Follow Up  HPI  Hypothyroidism: last TSH was at goal, he is very compliant with medication. He is feeling very tired again   Snoring/interrupted sleep: he had a high ESS - 14- in our office, also states father and younger brother have sleep apnea. He had a sleep study and it was negative, he states he is no longer snoring like he used to   Sterilization: he never had children but wife does not want to continue taking birth control, explained he should consider saving his sperm on a sperm bank. Consider IUD for his wife since they are so young   Obesity: he tried Contrave, but did not noticed any change in his appetite, also tool Belviq for the past 6 weeks and he gained weight. He states since he started on Qsymia 11/2016, initial weight of 251 lbs and he went as low as 198 lbs,currently off medication and weight has been stable around 200 lbs.   Insomnia: he is able to fall asleep but wakes up a couple of hours later and has difficulty to fall back asleep after that. Advised to flip his clock backwards   Mild left shoulder pain:doing much better now , he has noticed some right middle metacarpal pain, he states brother recently diagnosed with psoriasis and mother has RA, he has a follow up with Rheumatologist in June and will discuss it with him .   Vitamin D : he is taking supplementation , last level was towards higher end of normal, he is taking rx now every other week   Vitamin B12: he has not been taking SL B12 lately   Gout: under the care of Rheumatologist, taking 200 mg daily , no recent flares , has follow up soon   Patient Active Problem List   Diagnosis Date Noted  . Family history of rheumatoid arthritis 07/28/2018  . Hyperuricemia 07/28/2018  . Vitreous floater, bilateral 05/21/2018  . Dry eye syndrome of both lacrimal glands  05/21/2018  . Peroneal tendinitis 02/12/2018  . Overweight (BMI 25.0-29.9) 09/18/2017  . Bone spur of left ankle 03/28/2016  . Insomnia, persistent 08/26/2015  . History of hypertension 08/26/2015  . Adult hypothyroidism 08/26/2015  . B12 deficiency 08/26/2015  . Gout of big toe 08/26/2015  . Vitamin D deficiency 08/26/2015    Past Surgical History:  Procedure Laterality Date  . EYE SURGERY Bilateral 2012   L[facility] Eye Surgery  . FOOT SURGERY Right 2000  . LASIK Bilateral 2012   [facility]  . MANDIBLE FRACTURE SURGERY  2001  . RETINAL DETACHMENT SURGERY Right 2012   Laser for repair of non-traumatic RD  . RHINOPLASTY  2013    Family History  Problem Relation Age of Onset  . Anxiety disorder Mother   . Rheum arthritis Mother   . Hypothyroidism Father   . Heart Problems Brother   . Rheum arthritis Maternal Grandmother   . Pancreatic cancer Maternal Grandfather   . Liver cancer Paternal Grandfather   . Glaucoma Paternal Grandfather     Social History   Tobacco Use  . Smoking status: Never Smoker  . Smokeless tobacco: Never Used  Substance Use Topics  . Alcohol use: Yes    Alcohol/week: 0.0 standard drinks    Comment: OCCASIONALLY     Current Outpatient Medications:  .  Acetaminophen 325 MG  CAPS, Take 2 capsules by mouth as needed., Disp: , Rfl:  .  allopurinol (ZYLOPRIM) 100 MG tablet, TAKE 2 TABLETS BY MOUTH ONCE DAILY, Disp: 180 tablet, Rfl: 0 .  colchicine 0.6 MG tablet, Take 2 tablets (1.2 mg total) by mouth daily as needed (and repat in one hour if no resolution of pain)., Disp: 30 tablet, Rfl: 0 .  ibuprofen (ADVIL) 200 MG tablet, Take 2 tablets by mouth as needed., Disp: , Rfl:  .  levothyroxine (SYNTHROID) 75 MCG tablet, TAKE 1 TABLET BY MOUTH ONCE A DAY, Disp: 90 tablet, Rfl: 1 .  Methylcobalamin 1000 MCG SUBL, Place 1 tablet under the tongue daily., Disp: 30 tablet, Rfl: 0 .  Vitamin D, Ergocalciferol, (DRISDOL) 1.25 MG (50000 UNIT) CAPS capsule, TAKE 1  CAPSULE BY MOUTH EVERY 7 (SEVEN) DAYS, Disp: 12 capsule, Rfl: 3 .  allopurinol (ZYLOPRIM) 100 MG tablet, Take 200 mg by mouth daily. (Patient not taking: Reported on 05/17/2021), Disp: , Rfl:  .  allopurinol (ZYLOPRIM) 100 MG tablet, TAKE 2 TABLETS BY MOUTH DAILY (Patient not taking: Reported on 05/17/2021), Disp: 60 tablet, Rfl: 2 .  ketoconazole (NIZORAL) 2 % cream, APPLY TO THE AFFECTED AREA(S) DAILY AS NEEDED FOR IRRITATION (Patient not taking: No sig reported), Disp: 30 g, Rfl: 5  Allergies  Allergen Reactions  . Rhus Coriaria [Sumac] Hives    I personally reviewed active problem list, medication list, allergies, family history, social history, health maintenance with the patient/caregiver today.   ROS  Constitutional: Negative for fever or weight change.  Respiratory: Negative for cough and shortness of breath.   Cardiovascular: Negative for chest pain or palpitations.  Gastrointestinal: Negative for abdominal pain, no bowel changes.  Musculoskeletal: Negative for gait problem or joint swelling.  Skin: Negative for rash.  Neurological: Negative for dizziness or headache.  No other specific complaints in a complete review of systems (except as listed in HPI above).  Objective  Vitals:   05/17/21 1339  BP: 134/70  Pulse: 67  Resp: 16  Temp: 98.2 F (36.8 C)  TempSrc: Oral  SpO2: 99%  Weight: 201 lb (91.2 kg)  Height: 5\' 10"  (1.778 m)    Body mass index is 28.84 kg/m.  Physical Exam  Constitutional: Patient appears well-developed and well-nourished. Overweight. No distress.  HEENT: head atraumatic, normocephalic, pupils equal and reactive to light,  neck supple Cardiovascular: Normal rate, regular rhythm and normal heart sounds.  No murmur heard. No BLE edema. Pulmonary/Chest: Effort normal and breath sounds normal. No respiratory distress. Abdominal: Soft.  There is no tenderness. Psychiatric: Patient has a normal mood and affect. behavior is normal. Judgment and  thought content normal.  PHQ2/9: Depression screen [facility] 2/9 05/17/2021 11/16/2020 10/07/2019 05/13/2019 09/24/2018  Decreased Interest 0 0 0 0 0  Down, Depressed, Hopeless 0 0 0 0 0  PHQ - 2 Score 0 0 0 0 0  Altered sleeping - 2 1 3  -  Tired, decreased energy - 1 1 3  -  Change in appetite - 1 0 0 -  Feeling bad or failure about yourself  - 0 0 0 -  Trouble concentrating - 0 0 0 -  Moving slowly or fidgety/restless - 0 0 0 -  Suicidal thoughts - 0 0 0 -  PHQ-9 Score - 4 2 6  -  Difficult doing work/chores - Not difficult at all Not difficult at all Not difficult at all -    phq 9 is negative  Fall Risk: Fall Risk  05/17/2021  11/16/2020 10/07/2019 05/13/2019 09/24/2018  Falls in the past year? 0 0 0 0 No  Number falls in past yr: 0 0 0 0 -  Injury with Fall? 0 0 0 0 -    Functional Status Survey: Is the patient deaf or have difficulty hearing?: No Does the patient have difficulty seeing, even when wearing glasses/contacts?: No Does the patient have difficulty concentrating, remembering, or making decisions?: No Does the patient have difficulty walking or climbing stairs?: No Does the patient have difficulty dressing or bathing?: No Does the patient have difficulty doing errands alone such as visiting a doctor's office or shopping?: No    Assessment & Plan  1. Adult hypothyroidism  Continue medication   2. Vitamin D deficiency   3. B12 deficiency  Resume supplementation   4. Request for sterilization  - Ambulatory referral to Urology  5. Chronic gout of right ankle, unspecified cause  Continue follow up with Rheumatologist   6. Dyslipidemia  On life style modification   7. History of obesity

## 2021-05-24 ENCOUNTER — Other Ambulatory Visit: Payer: Self-pay

## 2021-05-24 MED ORDER — CARESTART COVID-19 HOME TEST VI KIT
PACK | 0 refills | Status: DC
  Filled 2021-05-24: qty 4, 4d supply, fill #0

## 2021-05-24 MED ORDER — ALLOPURINOL 100 MG PO TABS
ORAL_TABLET | ORAL | 0 refills | Status: DC
  Filled 2021-05-24: qty 180, 90d supply, fill #0

## 2021-05-24 MED FILL — Ergocalciferol Cap 1.25 MG (50000 Unit): ORAL | 28 days supply | Qty: 12 | Fill #0 | Status: AC

## 2021-06-04 ENCOUNTER — Other Ambulatory Visit: Payer: Self-pay

## 2021-06-04 MED ORDER — ALLOPURINOL 100 MG PO TABS
ORAL_TABLET | ORAL | 0 refills | Status: DC
  Filled 2021-06-04 – 2021-08-28 (×2): qty 225, 90d supply, fill #0

## 2021-07-08 ENCOUNTER — Encounter: Payer: Self-pay | Admitting: Family Medicine

## 2021-07-11 ENCOUNTER — Other Ambulatory Visit: Payer: Self-pay | Admitting: Family Medicine

## 2021-07-11 DIAGNOSIS — E039 Hypothyroidism, unspecified: Secondary | ICD-10-CM

## 2021-07-12 ENCOUNTER — Other Ambulatory Visit: Payer: Self-pay

## 2021-07-12 MED ORDER — LEVOTHYROXINE SODIUM 75 MCG PO TABS
ORAL_TABLET | Freq: Every day | ORAL | 1 refills | Status: DC
  Filled 2021-07-12: qty 90, 90d supply, fill #0
  Filled 2021-10-17: qty 90, 90d supply, fill #1

## 2021-08-28 ENCOUNTER — Other Ambulatory Visit: Payer: Self-pay

## 2021-08-30 ENCOUNTER — Other Ambulatory Visit: Payer: Self-pay

## 2021-08-30 MED ORDER — DIAZEPAM 10 MG PO TABS
ORAL_TABLET | ORAL | 0 refills | Status: DC
  Filled 2021-08-30: qty 1, 1d supply, fill #0

## 2021-10-17 ENCOUNTER — Other Ambulatory Visit: Payer: Self-pay

## 2021-10-17 MED FILL — Ergocalciferol Cap 1.25 MG (50000 Unit): ORAL | 28 days supply | Qty: 12 | Fill #1 | Status: AC

## 2021-10-30 ENCOUNTER — Telehealth: Payer: No Typology Code available for payment source | Admitting: Family Medicine

## 2021-10-30 DIAGNOSIS — R6889 Other general symptoms and signs: Secondary | ICD-10-CM | POA: Diagnosis not present

## 2021-10-31 ENCOUNTER — Other Ambulatory Visit: Payer: Self-pay

## 2021-10-31 MED ORDER — OSELTAMIVIR PHOSPHATE 75 MG PO CAPS
75.0000 mg | ORAL_CAPSULE | Freq: Two times a day (BID) | ORAL | 0 refills | Status: AC
  Filled 2021-10-31: qty 10, 5d supply, fill #0

## 2021-10-31 NOTE — Progress Notes (Signed)
E visit for Flu like symptoms   We are sorry that you are not feeling well.  Here is how we plan to help! Based on what you have shared with me it looks like you may have a respiratory virus that may be influenza.  Influenza or "the flu" is   an infection caused by a respiratory virus. The flu virus is highly contagious and persons who did not receive their yearly flu vaccination may "catch" the flu from close contact.  We have anti-viral medications to treat the viruses that cause this infection. They are not a "cure" and only shorten the course of the infection. These prescriptions are most effective when they are given within the first 2 days of "flu" symptoms. Antiviral medication are indicated if you have a high risk of complications from the flu. You should  also consider an antiviral medication if you are in close contact with someone who is at risk. These medications can help patients avoid complications from the flu  but have side effects that you should know. Possible side effects from Tamiflu or oseltamivir include nausea, vomiting, diarrhea, dizziness, headaches, eye redness, sleep problems or other respiratory symptoms. You should not take Tamiflu if you have an allergy to oseltamivir or any to the ingredients in Tamiflu.  Based upon your symptoms and potential risk factors I have prescribed Oseltamivir (Tamiflu).  It has been sent to your designated pharmacy.  You will take one 75 mg capsule orally twice a day for the next 5 days.  ANYONE WHO HAS FLU SYMPTOMS SHOULD: Stay home. The flu is highly contagious and going out or to work exposes others! Be sure to drink plenty of fluids. Water is fine as well as fruit juices, sodas and electrolyte beverages. You may want to stay away from caffeine or alcohol. If you are nauseated, try taking small sips of liquids. How do you know if you are getting enough fluid? Your urine should be a pale yellow or almost colorless. Get rest. Taking a steamy  shower or using a humidifier may help nasal congestion and ease sore throat pain. Using a saline nasal spray works much the same way. Cough drops, hard candies and sore throat lozenges may ease your cough. Line up a caregiver. Have someone check on you regularly.   GET HELP RIGHT AWAY IF: You cannot keep down liquids or your medications. You become short of breath Your fell like you are going to pass out or loose consciousness. Your symptoms persist after you have completed your treatment plan MAKE SURE YOU  Understand these instructions. Will watch your condition. Will get help right away if you are not doing well or get worse.  Your e-visit answers were reviewed by a board certified advanced clinical practitioner to complete your personal care plan.  Depending on the condition, your plan could have included both over the counter or prescription medications.  If there is a problem please reply  once you have received a response from your provider.  Your safety is important to us.  If you have drug allergies check your prescription carefully.    You can use MyChart to ask questions about today's visit, request a non-urgent call back, or ask for a work or school excuse for 24 hours related to this e-Visit. If it has been greater than 24 hours you will need to follow up with your provider, or enter a new e-Visit to address those concerns.  You will get an e-mail in the next   two days asking about your experience.  I hope that your e-visit has been valuable and will speed your recovery. Thank you for using e-visits.  I provided 5 minutes of non face-to-face time during this encounter for chart review, medication and order placement, as well as and documentation.   

## 2021-12-06 ENCOUNTER — Encounter: Payer: No Typology Code available for payment source | Admitting: Family Medicine

## 2021-12-18 NOTE — Progress Notes (Signed)
Name: Oscar Allen   MRN: [id number]    DOB: 1986-06-23   Date:12/20/2021       Progress Note  Subjective  Chief Complaint  Annual Exam  HPI  Patient presents for annual CPE and follow up  Hypothyroidism: last TSH was at goal, he is very compliant with medication. He has gained weight lately, he has episodes of feeling  very tired.    Snoring/interrupted sleep: he had a high ESS - 14- in our office, also states father and younger brother have sleep apnea. He had a sleep study and it was negative. Still not snoring loudly even though he has gained some weight    Obesity: he tried Contrave, but did not noticed any change in his appetite, also tool Belviq for the past 6 weeks and he gained weight.  He states since he started on Qsymia 11/2016 , initial weight of 251 lbs and he went as low as 198 lbs,  he stopped taking it a couple of year ago and was able to maintain the weight around 200, however over the past 6 months he noticed difficulty controlling his cravings and is gradually gaining the weight back, even though he goes to the gym at least 3 times a week. We will try getting Qsymia approved again to avoid his weight spiking back to 250 lbs.    Insomnia: he is able to fall asleep but wakes up a couple of hours later and has difficulty to fall back asleep after that. Advised to flip his clock backwards    Vitamin D : he is taking rx supplementation, we will recheck level    Vitamin B12: he has been taking B12 oral spray and we will recheck level today. He always feels tired but is chronic    Gout: under the care of Rheumatologist, taking 200 mg daily , no recent flares , he had a visit today and they asked me to recheck uric acid   Diet: he is trying to resume a healthier diet  Exercise: continue regular physical activities   Depression: phq 9 is negative Depression screen [facility] 2/9 12/20/2021 05/17/2021 11/16/2020 10/07/2019 05/13/2019  Decreased Interest 0 0 0 0 0  Down,  Depressed, Hopeless 0 0 0 0 0  PHQ - 2 Score 0 0 0 0 0  Altered sleeping 0 - 2 1 3   Tired, decreased energy 3 - 1 1 3   Change in appetite 1 - 1 0 0  Feeling bad or failure about yourself  0 - 0 0 0  Trouble concentrating 0 - 0 0 0  Moving slowly or fidgety/restless 0 - 0 0 0  Suicidal thoughts 0 - 0 0 0  PHQ-9 Score 4 - 4 2 6   Difficult doing work/chores - - Not difficult at all Not difficult at all Not difficult at all    Hypertension:  BP Readings from Last 3 Encounters:  12/20/21 130/70  05/17/21 134/70  11/16/20 112/72    Obesity: Wt Readings from Last 3 Encounters:  12/20/21 218 lb (98.9 kg)  05/17/21 201 lb (91.2 kg)  11/16/20 210 lb (95.3 kg)   BMI Readings from Last 3 Encounters:  12/20/21 31.28 kg/m  05/17/21 28.84 kg/m  11/16/20 30.13 kg/m     Lipids:  Lab Results  Component Value Date   CHOL 209 (H) 11/16/2020   CHOL 227 (H) 10/07/2019   CHOL 161 09/24/2018   Lab Results  Component Value Date   HDL 51 11/16/2020   HDL  54 10/07/2019   HDL 41 09/24/2018   Lab Results  Component Value Date   LDLCALC 138 (H) 11/16/2020   LDLCALC 145 (H) 10/07/2019   LDLCALC 102 (H) 09/24/2018   Lab Results  Component Value Date   TRIG 94 11/16/2020   TRIG 146 10/07/2019   TRIG 87 09/24/2018   Lab Results  Component Value Date   CHOLHDL 4.1 11/16/2020   CHOLHDL 4.2 10/07/2019   CHOLHDL 3.9 09/24/2018   No results found for: LDLDIRECT Glucose:  Glucose, Bld  Date Value Ref Range Status  11/16/2020 79 65 - 99 mg/dL Final    Comment:    .            Fasting reference interval .   10/07/2019 88 65 - 99 mg/dL Final    Comment:    .            Fasting reference interval .   09/24/2018 81 65 - 99 mg/dL Final    Comment:    .            Fasting reference interval .     [place] Office Visit from 11/16/2020 in [facility]  AUDIT-C Score 2      Married  STD testing and prevention (HIV/chl/gon/syphilis): 08/31/15 Hep  C: 10/07/19  Skin cancer: Discussed monitoring for atypical lesions Colorectal cancer: N/A  Vaccines:   Pneumonia: educated and discussed with patient. Flu: educated and discussed with patient.  Advanced Care Planning: A voluntary discussion about advance care planning including the explanation and discussion of advance directives.  Discussed health care proxy and Living will, and the patient was able to identify a health care proxy as wife.    Patient Active Problem List   Diagnosis Date Noted   Family history of rheumatoid arthritis 07/28/2018   Hyperuricemia 07/28/2018   Vitreous floater, bilateral 05/21/2018   Dry eye syndrome of both lacrimal glands 05/21/2018   Peroneal tendinitis 02/12/2018   Overweight (BMI 25.0-29.9) 09/18/2017   Bone spur of left ankle 03/28/2016   Insomnia, persistent 08/26/2015   History of hypertension 08/26/2015   Adult hypothyroidism 08/26/2015   B12 deficiency 08/26/2015   Gout of big toe 08/26/2015   Vitamin D deficiency 08/26/2015    Past Surgical History:  Procedure Laterality Date   EYE SURGERY Bilateral 2012   [facility]   FOOT SURGERY Right 2000   LASIK Bilateral 2012   [place] [facility]   MANDIBLE FRACTURE SURGERY  2001   RETINAL DETACHMENT SURGERY Right 2012   Laser for repair of non-traumatic RD   RHINOPLASTY  2013    Family History  Problem Relation Age of Onset   Anxiety disorder Mother    Rheum arthritis Mother    Hypothyroidism Father    Heart Problems Brother    Psoriasis Brother    Rheum arthritis Maternal Grandmother    Pancreatic cancer Maternal Grandfather    Liver cancer Paternal Grandfather    Glaucoma Paternal Grandfather     Social History   Socioeconomic History   Marital status: Married    Spouse name: Barista   Number of children: 0   Years of education: Not on file   Highest education level: Doctorate  Occupational History   Occupation: PT  Tobacco Use   Smoking status: Never   Smokeless tobacco:  Never  Vaping Use   Vaping Use: Never used  Substance and Sexual Activity   Alcohol use: Yes    Alcohol/week:  0.0 standard drinks    Comment: OCCASIONALLY   Drug use: No   Sexual activity: Yes    Partners: Female  Other Topics Concern   Not on file  Social History Narrative   Not on file   Social Determinants of Health   Financial Resource Strain: Low Risk    Difficulty of Paying Living Expenses: Not hard at all  Food Insecurity: No Food Insecurity   Worried About Charity fundraiser in the Last Year: Never true   [place] in the Last Year: Never true  Transportation Needs: No Transportation Needs   Lack of Transportation (Medical): No   Lack of Transportation (Non-Medical): No  Physical Activity: Sufficiently Active   Days of Exercise per Week: 4 days   Minutes of Exercise per Session: 60 min  Stress: No Stress Concern Present   Feeling of Stress : Only a little  Social Connections: Moderately Isolated   Frequency of Communication with Friends and Family: Twice a week   Frequency of Social Gatherings with Friends and Family: Twice a week   Attends Religious Services: Never   Marine scientist or Organizations: No   Attends Music therapist: Never   Marital Status: Married  Human resources officer Violence: Not At Risk   Fear of Current or Ex-Partner: No   Emotionally Abused: No   Physically Abused: No   Sexually Abused: No     Current Outpatient Medications:    Acetaminophen 325 MG CAPS, Take 2 capsules by mouth as needed., Disp: , Rfl:    allopurinol (ZYLOPRIM) 100 MG tablet, 2 and a half  tablets Orally Once a day 90 days, Disp: 225 tablet, Rfl: 0   colchicine 0.6 MG tablet, Take 2 tablets (1.2 mg total) by mouth daily as needed (and repat in one hour if no resolution of pain)., Disp: 30 tablet, Rfl: 0   diazepam (VALIUM) 10 MG tablet, Take 1 tablet by mouth 1 hour prior to procedure, Disp: 1 tablet, Rfl: 0   ibuprofen (ADVIL) 200 MG tablet, Take  2 tablets by mouth as needed., Disp: , Rfl:    levothyroxine (SYNTHROID) 75 MCG tablet, TAKE 1 TABLET BY MOUTH ONCE A DAY, Disp: 90 tablet, Rfl: 1   Methylcobalamin 1000 MCG SUBL, Place 1 tablet under the tongue daily., Disp: 30 tablet, Rfl: 0  Allergies  Allergen Reactions   Rhus Coriaria [[place]] Hives     ROS  Constitutional: Negative for fever , positive for weight change.  Respiratory: Negative for cough and shortness of breath.   Cardiovascular: Negative for chest pain or palpitations.  Gastrointestinal: Negative for abdominal pain, no bowel changes.  Musculoskeletal: Negative for gait problem or joint swelling.  Skin: Negative for rash.  Neurological: Negative for dizziness or headache.  No other specific complaints in a complete review of systems (except as listed in HPI above).    Objective  Vitals:   12/20/21 1119  BP: 130/70  Pulse: 64  Resp: 16  Temp: 98.2 F (36.8 C)  SpO2: 99%  Weight: 218 lb (98.9 kg)  Height: 5\' 10"  (1.778 m)    Body mass index is 31.28 kg/m.  Physical Exam  Constitutional: Patient appears well-developed and obesity  No distress.  HENT: Head: Normocephalic and atraumatic. Ears: B TMs ok, no erythema or effusion; Nose: Not done Mouth/Throat: not done  Eyes: Conjunctivae and EOM are normal. Pupils are equal, round, and reactive to light. No scleral icterus.  Neck: Normal range of  motion. Neck supple. No JVD present. No thyromegaly present.  Cardiovascular: Normal rate, regular rhythm and normal heart sounds.  No murmur heard. No BLE edema. Pulmonary/Chest: Effort normal and breath sounds normal. No respiratory distress. Abdominal: Soft. Bowel sounds are normal, no distension. There is no tenderness. no masses MALE GENITALIA: Normal descended testes bilaterally, no masses palpated, no hernias, no lesions, no discharge RECTAL: not done  Musculoskeletal: Normal range of motion, no joint effusions. No gross deformities Neurological: he is  alert and oriented to person, place, and time. No cranial nerve deficit. Coordination, balance, strength, speech and gait are normal.  Skin: Skin is warm and dry. No rash noted. No erythema.  Psychiatric: Patient has a normal mood and affect. behavior is normal. Judgment and thought content normal.    Fall Risk: Fall Risk  12/20/2021 05/17/2021 11/16/2020 10/07/2019 05/13/2019  Falls in the past year? 0 0 0 0 0  Number falls in past yr: 0 0 0 0 0  Injury with Fall? 0 0 0 0 0  Risk for fall due to : No Fall Risks - - - -  Follow up Falls prevention discussed - - - -      Functional Status Survey: Is the patient deaf or have difficulty hearing?: No Does the patient have difficulty seeing, even when wearing glasses/contacts?: No Does the patient have difficulty concentrating, remembering, or making decisions?: No Does the patient have difficulty walking or climbing stairs?: No Does the patient have difficulty dressing or bathing?: No Does the patient have difficulty doing errands alone such as visiting a doctor's office or shopping?: No    Assessment & Plan  1. Well adult exam  - Lipid panel - CBC with Differential/Platelet - COMPLETE METABOLIC PANEL WITH GFR - Hemoglobin A1c - VITAMIN D 25 Hydroxy (Vit-D Deficiency, Fractures) - Vitamin B12 - TSH - Uric acid  2. Vitamin D deficiency  - VITAMIN D 25 Hydroxy (Vit-D Deficiency, Fractures)  3. Chronic gout of right ankle, unspecified cause  - Uric acid  4. Adult hypothyroidism  - TSH - levothyroxine (SYNTHROID) 75 MCG tablet; TAKE 1 TABLET BY MOUTH ONCE A DAY  Dispense: 90 tablet; Refill: 1  5. B12 deficiency  - Vitamin B12  6. Dyslipidemia  - Lipid panel  7. Diabetes mellitus screening  - Hemoglobin A1c  8. Long-term use of high-risk medication  - CBC with Differential/Platelet - COMPLETE METABOLIC PANEL WITH GFR  9. Obesity (BMI 30.0-34.9)  - Phentermine-Topiramate (QSYMIA) 3.75-23 MG CP24; Take 1  capsule by mouth daily at 12 noon.  Dispense: 30 capsule; Refill: 0 - Phentermine-Topiramate (QSYMIA) 7.5-46 MG CP24; Take 1 capsule by mouth daily at 12 noon.  Dispense: 30 capsule; Refill: 0     -Prostate cancer screening and PSA options (with potential risks and benefits of testing vs not testing) were discussed along with recent recs/guidelines. -USPSTF grade A and B recommendations reviewed with patient; age-appropriate recommendations, preventive care, screening tests, etc discussed and encouraged; healthy living encouraged; see AVS for patient education given to patient -Discussed importance of 150 minutes of physical activity weekly, eat two servings of fish weekly, eat one serving of tree nuts ( cashews, pistachios, pecans, almonds.Marland Kitchen) every other day, eat 6 servings of fruit/vegetables daily and drink plenty of water and avoid sweet beverages.

## 2021-12-20 ENCOUNTER — Encounter: Payer: Self-pay | Admitting: Family Medicine

## 2021-12-20 ENCOUNTER — Other Ambulatory Visit: Payer: Self-pay

## 2021-12-20 ENCOUNTER — Encounter: Payer: No Typology Code available for payment source | Admitting: Family Medicine

## 2021-12-20 ENCOUNTER — Ambulatory Visit (INDEPENDENT_AMBULATORY_CARE_PROVIDER_SITE_OTHER): Payer: No Typology Code available for payment source | Admitting: Family Medicine

## 2021-12-20 VITALS — BP 130/70 | HR 64 | Temp 98.2°F | Resp 16 | Ht 70.0 in | Wt 218.0 lb

## 2021-12-20 DIAGNOSIS — E538 Deficiency of other specified B group vitamins: Secondary | ICD-10-CM

## 2021-12-20 DIAGNOSIS — E669 Obesity, unspecified: Secondary | ICD-10-CM

## 2021-12-20 DIAGNOSIS — Z79899 Other long term (current) drug therapy: Secondary | ICD-10-CM

## 2021-12-20 DIAGNOSIS — Z0000 Encounter for general adult medical examination without abnormal findings: Secondary | ICD-10-CM

## 2021-12-20 DIAGNOSIS — M1A0710 Idiopathic chronic gout, right ankle and foot, without tophus (tophi): Secondary | ICD-10-CM | POA: Diagnosis not present

## 2021-12-20 DIAGNOSIS — E039 Hypothyroidism, unspecified: Secondary | ICD-10-CM

## 2021-12-20 DIAGNOSIS — E559 Vitamin D deficiency, unspecified: Secondary | ICD-10-CM | POA: Diagnosis not present

## 2021-12-20 DIAGNOSIS — Z131 Encounter for screening for diabetes mellitus: Secondary | ICD-10-CM

## 2021-12-20 DIAGNOSIS — E785 Hyperlipidemia, unspecified: Secondary | ICD-10-CM

## 2021-12-20 MED ORDER — LEVOTHYROXINE SODIUM 75 MCG PO TABS
ORAL_TABLET | Freq: Every day | ORAL | 1 refills | Status: DC
  Filled 2021-12-20 – 2022-01-02 (×2): qty 90, 90d supply, fill #0
  Filled 2022-04-28 – 2022-05-09 (×2): qty 90, 90d supply, fill #1

## 2021-12-20 MED ORDER — QSYMIA 3.75-23 MG PO CP24
1.0000 | ORAL_CAPSULE | Freq: Every day | ORAL | 0 refills | Status: DC
  Filled 2021-12-20: qty 30, 30d supply, fill #0

## 2021-12-20 MED ORDER — QSYMIA 7.5-46 MG PO CP24
1.0000 | ORAL_CAPSULE | Freq: Every day | ORAL | 0 refills | Status: DC
  Filled 2021-12-20: qty 30, 30d supply, fill #0

## 2021-12-20 NOTE — Patient Instructions (Signed)
Preventive Care 72-36 Years Old, Male Preventive care refers to lifestyle choices and visits with your health care provider that can promote health and wellness. Preventive care visits are also called wellness exams. What can I expect for my preventive care visit? Counseling During your preventive care visit, your health care provider may ask about your: Medical history, including: Past medical problems. Family medical history. Current health, including: Emotional well-being. Home life and relationship well-being. Sexual activity. Lifestyle, including: Alcohol, nicotine or tobacco, and drug use. Access to firearms. Diet, exercise, and sleep habits. Safety issues such as seatbelt and bike helmet use. Sunscreen use. Work and work Statistician. Physical exam Your health care provider may check your: Height and weight. These may be used to calculate your BMI (body mass index). BMI is a measurement that tells if you are at a healthy weight. Waist circumference. This measures the distance around your waistline. This measurement also tells if you are at a healthy weight and may help predict your risk of certain diseases, such as type 2 diabetes and high blood pressure. Heart rate and blood pressure. Body temperature. Skin for abnormal spots. What immunizations do I need? Vaccines are usually given at various ages, according to a schedule. Your health care provider will recommend vaccines for you based on your age, medical history, and lifestyle or other factors, such as travel or where you work. What tests do I need? Screening Your health care provider may recommend screening tests for certain conditions. This may include: Lipid and cholesterol levels. Diabetes screening. This is done by checking your blood sugar (glucose) after you have not eaten for a while (fasting). Hepatitis B test. Hepatitis C test. HIV (human immunodeficiency virus) test. STI (sexually transmitted infection)  testing, if you are at risk. Talk with your health care provider about your test results, treatment options, and if necessary, the need for more tests. Follow these instructions at home: Eating and drinking  Eat a healthy diet that includes fresh fruits and vegetables, whole grains, lean protein, and low-fat dairy products. Drink enough fluid to keep your urine pale yellow. Take vitamin and mineral supplements as recommended by your health care provider. Do not drink alcohol if your health care provider tells you not to drink. If you drink alcohol: Limit how much you have to 0-2 drinks a day. Know how much alcohol is in your drink. In the [place] one drink equals one 12 oz bottle of beer (355 mL), one 5 oz glass of wine (148 mL), or one 1 oz glass of hard liquor (44 mL). Lifestyle Brush your teeth every morning and night with fluoride toothpaste. Floss one time each day. Exercise for at least 30 minutes 5 or more days each week. Do not use any products that contain nicotine or tobacco. These products include cigarettes, chewing tobacco, and vaping devices, such as e-cigarettes. If you need help quitting, ask your health care provider. Do not use drugs. If you are sexually active, practice safe sex. Use a condom or other form of protection to prevent STIs. Find healthy ways to manage stress, such as: Meditation, yoga, or listening to music. Journaling. Talking to a trusted person. Spending time with friends and family. Minimize exposure to UV radiation to reduce your risk of skin cancer. Safety Always wear your seat belt while driving or riding in a vehicle. Do not drive: If you have been drinking alcohol. Do not ride with someone who has been drinking. If you have been using any mind-altering substances or  drugs. While texting. When you are tired or distracted. Wear a helmet and other protective equipment during sports activities. If you have firearms in your house, make sure you  follow all gun safety procedures. Seek help if you have been physically or sexually abused. What's next? Go to your health care provider once a year for an annual wellness visit. Ask your health care provider how often you should have your eyes and teeth checked. Stay up to date on all vaccines. This information is not intended to replace advice given to you by your health care provider. Make sure you discuss any questions you have with your health care provider. Document Revised: 06/12/2021 Document Reviewed: 06/12/2021 Elsevier Patient Education  [employer].

## 2021-12-21 LAB — COMPLETE METABOLIC PANEL WITH GFR
AG Ratio: 2.1 (calc) (ref 1.0–2.5)
ALT: 36 U/L (ref 9–46)
AST: 20 U/L (ref 10–40)
Albumin: 4.7 g/dL (ref 3.6–5.1)
Alkaline phosphatase (APISO): 37 U/L (ref 36–130)
BUN: 9 mg/dL (ref 7–25)
CO2: 29 mmol/L (ref 20–32)
Calcium: 9.8 mg/dL (ref 8.6–10.3)
Chloride: 105 mmol/L (ref 98–110)
Creat: 0.89 mg/dL (ref 0.60–1.26)
Globulin: 2.2 g/dL (calc) (ref 1.9–3.7)
Glucose, Bld: 77 mg/dL (ref 65–99)
Potassium: 4 mmol/L (ref 3.5–5.3)
Sodium: 142 mmol/L (ref 135–146)
Total Bilirubin: 0.7 mg/dL (ref 0.2–1.2)
Total Protein: 6.9 g/dL (ref 6.1–8.1)
eGFR: 114 mL/min/{1.73_m2} (ref >=60)

## 2021-12-21 LAB — CBC WITH DIFFERENTIAL/PLATELET
Absolute Monocytes: 391 cells/uL (ref 200–950)
Basophils Absolute: 50 cells/uL (ref 0–200)
Basophils Relative: 0.9 %
Eosinophils Absolute: 110 cells/uL (ref 15–500)
Eosinophils Relative: 2 %
HCT: 43.2 % (ref 38.5–50.0)
Hemoglobin: 14.5 g/dL (ref 13.2–17.1)
Lymphs Abs: 2393 cells/uL (ref 850–3900)
MCH: 28.5 pg (ref 27.0–33.0)
MCHC: 33.6 g/dL (ref 32.0–36.0)
MCV: 85 fL (ref 80.0–100.0)
MPV: 10 fL (ref 7.5–12.5)
Monocytes Relative: 7.1 %
Neutro Abs: 2558 cells/uL (ref 1500–7800)
Neutrophils Relative %: 46.5 %
Platelets: 274 10*3/uL (ref 140–400)
RBC: 5.08 10*6/uL (ref 4.20–5.80)
RDW: 13.4 % (ref 11.0–15.0)
Total Lymphocyte: 43.5 %
WBC: 5.5 10*3/uL (ref 3.8–10.8)

## 2021-12-21 LAB — VITAMIN B12: Vitamin B-12: 429 pg/mL (ref 200–1100)

## 2021-12-21 LAB — HEMOGLOBIN A1C
Hgb A1c MFr Bld: 5.1 % of total Hgb (ref <5.7)
Mean Plasma Glucose: 100 mg/dL
eAG (mmol/L): 5.5 mmol/L

## 2021-12-21 LAB — LIPID PANEL
Cholesterol: 211 mg/dL — ABNORMAL HIGH (ref <200)
HDL: 53 mg/dL (ref >=40)
LDL Cholesterol (Calc): 138 mg/dL (calc) — ABNORMAL HIGH
Non-HDL Cholesterol (Calc): 158 mg/dL (calc) — ABNORMAL HIGH (ref <130)
Total CHOL/HDL Ratio: 4 (calc) (ref <5.0)
Triglycerides: 95 mg/dL (ref <150)

## 2021-12-21 LAB — VITAMIN D 25 HYDROXY (VIT D DEFICIENCY, FRACTURES): Vit D, 25-Hydroxy: 54 ng/mL (ref 30–100)

## 2021-12-21 LAB — URIC ACID: Uric Acid, Serum: 6.6 mg/dL (ref 4.0–8.0)

## 2021-12-21 LAB — TSH: TSH: 2.17 mIU/L (ref 0.40–4.50)

## 2021-12-25 ENCOUNTER — Other Ambulatory Visit: Payer: Self-pay

## 2022-01-02 ENCOUNTER — Other Ambulatory Visit: Payer: Self-pay

## 2022-01-03 ENCOUNTER — Other Ambulatory Visit: Payer: Self-pay

## 2022-01-03 MED ORDER — ALLOPURINOL 100 MG PO TABS
ORAL_TABLET | ORAL | 0 refills | Status: DC
  Filled 2022-01-03: qty 10, 3d supply, fill #0
  Filled 2022-01-03: qty 215, 87d supply, fill #0

## 2022-01-06 ENCOUNTER — Other Ambulatory Visit: Payer: Self-pay

## 2022-02-06 ENCOUNTER — Ambulatory Visit: Payer: No Typology Code available for payment source | Admitting: Dermatology

## 2022-02-06 ENCOUNTER — Other Ambulatory Visit: Payer: Self-pay

## 2022-02-06 DIAGNOSIS — D1800 Hemangioma unspecified site: Secondary | ICD-10-CM | POA: Diagnosis not present

## 2022-02-06 DIAGNOSIS — L578 Other skin changes due to chronic exposure to nonionizing radiation: Secondary | ICD-10-CM | POA: Diagnosis not present

## 2022-02-06 DIAGNOSIS — D229 Melanocytic nevi, unspecified: Secondary | ICD-10-CM

## 2022-02-06 DIAGNOSIS — L814 Other melanin hyperpigmentation: Secondary | ICD-10-CM

## 2022-02-06 DIAGNOSIS — Z1283 Encounter for screening for malignant neoplasm of skin: Secondary | ICD-10-CM | POA: Diagnosis not present

## 2022-02-06 DIAGNOSIS — L821 Other seborrheic keratosis: Secondary | ICD-10-CM

## 2022-02-06 NOTE — Patient Instructions (Addendum)
Recommend taking Heliocare sun protection supplement daily in sunny weather for additional sun protection. For maximum protection on the sunniest days, you can take up to 2 capsules of regular Heliocare OR take 1 capsule of Heliocare Ultra. For prolonged exposure (such as a full day in the sun), you can repeat your dose of the supplement 4 hours after your first dose. Heliocare can be purchased at [employer], at some [facility] or at [email or web address].    Melanoma ABCDEs  Melanoma is the most dangerous type of skin cancer, and is the leading cause of death from skin disease.  You are more likely to develop melanoma if you: Have light-colored skin, light-colored eyes, or red or blond hair Spend a lot of time in the sun Tan regularly, either outdoors or in a tanning bed Have had blistering sunburns, especially during childhood Have a close family member who has had a melanoma Have atypical moles or large birthmarks  Early detection of melanoma is key since treatment is typically straightforward and cure rates are extremely high if we catch it early.   The first sign of melanoma is often a change in a mole or a new dark spot.  The ABCDE system is a way of remembering the signs of melanoma.  A for asymmetry:  The two halves do not match. B for border:  The edges of the growth are irregular. C for color:  A mixture of colors are present instead of an even brown color. D for diameter:  Melanomas are usually (but not always) greater than 55mm - the size of a pencil eraser. E for evolution:  The spot keeps changing in size, shape, and color.  Please check your skin once per month between visits. You can use a small mirror in front and a large mirror behind you to keep an eye on the back side or your body.   If you see any new or changing lesions before your next follow-up, please call to schedule a visit.  Please continue daily skin protection including broad spectrum sunscreen SPF 30+ to  sun-exposed areas, reapplying every 2 hours as needed when you're outdoors.    If You Need Anything After Your Visit  If you have any questions or concerns for your doctor, please call our main line at [phone] and press option 4 to reach your doctor's medical assistant. If no one answers, please leave a voicemail as directed and we will return your call as soon as possible. Messages left after 4 pm will be answered the following business day.   You may also send [place] a message via [place]. We typically respond to MyChart messages within 1-2 business days.  For prescription refills, please ask your pharmacy to contact our office. Our fax number is [phone].  If you have an urgent issue when the clinic is closed that cannot wait until the next business day, you can page your doctor at the number below.    Please note that while we do our best to be available for urgent issues outside of office hours, we are not available 24/7.   If you have an urgent issue and are unable to reach [place], you may choose to seek medical care at your doctor's office, retail clinic, urgent care center, or emergency room.  If you have a medical emergency, please immediately call 911 or go to the emergency department.  Pager Numbers  - Dr. Nehemiah Massed: [phone]  - Dr. Laurence Ferrari: [phone]  - Dr. Nicole Kindred: [phone]  In the event of inclement weather, please call our main line at [phone] for an update on the status of any delays or closures.  Dermatology Medication Tips: Please keep the boxes that topical medications come in in order to help keep track of the instructions about where and how to use these. Pharmacies typically print the medication instructions only on the boxes and not directly on the medication tubes.   If your medication is too expensive, please contact our office at [phone] option 4 or send [place] a message through [place].   We are unable to tell what your co-pay for medications  will be in advance as this is different depending on your insurance coverage. However, we may be able to find a substitute medication at lower cost or fill out paperwork to get insurance to cover a needed medication.   If a prior authorization is required to get your medication covered by your insurance company, please allow [place] 1-2 business days to complete this process.  Drug prices often vary depending on where the prescription is filled and some pharmacies may offer cheaper prices.  The website www.goodrx.com contains coupons for medications through different pharmacies. The prices here do not account for what the cost may be with help from insurance (it may be cheaper with your insurance), but the website can give you the price if you did not use any insurance.  - You can print the associated coupon and take it with your prescription to the pharmacy.  - You may also stop by our office during regular business hours and pick up a GoodRx coupon card.  - If you need your prescription sent electronically to a different pharmacy, notify our office through [facility] or by phone at [phone] option 4.     Si Usted Necesita Algo Despus de Su Visita  Tambin puede enviarnos un mensaje a travs de Pharmacist, community. Por lo general respondemos a los mensajes de MyChart en el transcurso de 1 a 2 das hbiles.  Para renovar recetas, por favor pida a su farmacia que se ponga en contacto con nuestra oficina. Harland Dingwall de fax es [place] [phone].  Si tiene un asunto urgente cuando la clnica est cerrada y que no puede esperar hasta el siguiente da hbil, puede llamar/localizar a su doctor(a) al nmero que aparece a continuacin.   Por favor, tenga en cuenta que aunque hacemos todo lo posible para estar disponibles para asuntos urgentes fuera del horario de [place], no estamos disponibles las 24 horas del da, los 7 das de la [place].   Si tiene un problema urgente y no puede comunicarse con  nosotros, puede optar por buscar atencin mdica  en el consultorio de su doctor(a), en una clnica privada, en un centro de atencin urgente o en una sala de emergencias.  Si tiene Engineering geologist, por favor llame inmediatamente al 911 o vaya a la sala de emergencias.  Nmeros de bper  - Dr. Nehemiah Massed: [phone]  - Dra. Moye: [phone]  - Dra. Nicole Kindred: [phone]  En caso de inclemencias del [place], por favor llame a Johnsie Kindred principal al [phone] para una actualizacin sobre el [place] de cualquier retraso o cierre.  Consejos para la medicacin en dermatologa: Por favor, guarde las cajas en las que vienen los medicamentos de uso tpico para ayudarle a seguir las instrucciones sobre dnde y cmo usarlos. Las farmacias generalmente imprimen las instrucciones del medicamento slo en las cajas y no directamente en los tubos del [place].  Si su medicamento es muy caro, por favor, pngase en contacto con Zigmund Daniel llamando al [phone] y presione la opcin 4 o envenos un mensaje a travs de Pharmacist, community.   No podemos decirle cul ser su copago por los medicamentos por adelantado ya que esto es diferente dependiendo de la cobertura de su seguro. Sin embargo, es posible que podamos encontrar un medicamento sustituto a Electrical engineer un formulario para que el seguro cubra el medicamento que se considera necesario.   Si se requiere una autorizacin previa para que su compaa de seguros [place] su medicamento, por favor permtanos de 1 a 2 das hbiles para completar este proceso.  Los precios de los medicamentos varan con frecuencia dependiendo del Environmental consultant de dnde se surte la receta y alguna farmacias pueden ofrecer precios ms baratos.  El sitio web www.goodrx.com tiene cupones para medicamentos de Airline pilot. Los precios aqu no tienen en cuenta lo que podra costar con la ayuda del seguro (puede ser ms barato con su seguro), pero el sitio web  puede darle el precio si no utiliz Research scientist (physical sciences).  - Puede imprimir el cupn correspondiente y llevarlo con su receta a la farmacia.  - Tambin puede pasar por nuestra oficina durante el horario de atencin regular y Charity fundraiser una tarjeta de cupones de GoodRx.  - Si necesita que su receta se enve electrnicamente a una farmacia diferente, informe a nuestra oficina a travs de MyChart de [place] o por telfono llamando al [phone] y presione la opcin 4.

## 2022-02-06 NOTE — Progress Notes (Signed)
° °  Follow-Up Visit   Subjective  Oscar Allen is a 36 y.o. male who presents for the following: FBSE (Patient here for full body skin exam and skin cancer screening. Patient with no hx of skin cancer, there is a fhx of skin cancer in patient's grandmother, patient unsure what type. Patient is not aware of any new or changing spots. ).  The following portions of the chart were reviewed this encounter and updated as appropriate:   Tobacco   Allergies   Meds   Problems   Med Hx   Surg Hx   Fam Hx       Review of Systems:  No other skin or systemic complaints except as noted in HPI or Assessment and Plan.  Objective  Well appearing patient in no apparent distress; mood and affect are within normal limits.  A full examination was performed including scalp, head, eyes, ears, nose, lips, neck, chest, axillae, abdomen, back, buttocks, bilateral upper extremities, bilateral lower extremities, hands, feet, fingers, toes, fingernails, and toenails. All findings within normal limits unless otherwise noted below.    Assessment & Plan   Lentigines - Scattered tan macules - Due to sun exposure - Benign-appearing, observe - Recommend daily broad spectrum sunscreen SPF 30+ to sun-exposed areas, reapply every 2 hours as needed. - Call for any changes  Seborrheic Keratoses - Stuck-on, waxy, tan-brown papules and/or plaques  - Benign-appearing - Discussed benign etiology and prognosis. - Observe - Call for any changes  Melanocytic Nevi - Tan-brown and/or pink-flesh-colored symmetric macules and papules - Benign appearing on exam today - Observation - Call clinic for new or changing moles - Recommend daily use of broad spectrum spf 30+ sunscreen to sun-exposed areas.   Hemangiomas - Red papules - Discussed benign nature - Observe - Call for any changes  Actinic Damage - Chronic condition, secondary to cumulative UV/sun exposure - diffuse scaly erythematous macules with underlying  dyspigmentation - Recommend daily broad spectrum sunscreen SPF 30+ to sun-exposed areas, reapply every 2 hours as needed.  - Staying in the shade or wearing long sleeves, sun glasses (UVA+UVB protection) and wide brim hats (4-inch brim around the entire circumference of the hat) are also recommended for sun protection.  - Call for new or changing lesions.  Skin cancer screening performed today.  Return for TBSE 1-2 years.  Graciella Belton, RMA, am acting as scribe for Forest Gleason, MD .  Documentation: I have reviewed the above documentation for accuracy and completeness, and I agree with the above.  Forest Gleason, MD

## 2022-02-15 ENCOUNTER — Encounter: Payer: Self-pay | Admitting: Dermatology

## 2022-02-21 ENCOUNTER — Other Ambulatory Visit (HOSPITAL_COMMUNITY): Payer: Self-pay

## 2022-02-21 MED ORDER — TRAMADOL HCL 50 MG PO TABS
50.0000 mg | ORAL_TABLET | Freq: Four times a day (QID) | ORAL | 0 refills | Status: DC
  Filled 2022-02-21: qty 15, 4d supply, fill #0

## 2022-03-14 ENCOUNTER — Other Ambulatory Visit: Payer: Self-pay

## 2022-03-14 MED ORDER — DOXYCYCLINE HYCLATE 100 MG PO CAPS
ORAL_CAPSULE | ORAL | 0 refills | Status: DC
  Filled 2022-03-14: qty 20, 10d supply, fill #0

## 2022-03-21 ENCOUNTER — Ambulatory Visit: Payer: No Typology Code available for payment source | Admitting: Family Medicine

## 2022-04-28 ENCOUNTER — Other Ambulatory Visit: Payer: Self-pay

## 2022-04-28 MED ORDER — ALLOPURINOL 100 MG PO TABS
ORAL_TABLET | ORAL | 0 refills | Status: DC
  Filled 2022-04-28: qty 225, 90d supply, fill #0

## 2022-04-29 ENCOUNTER — Other Ambulatory Visit: Payer: Self-pay

## 2022-05-01 ENCOUNTER — Other Ambulatory Visit: Payer: Self-pay

## 2022-05-02 ENCOUNTER — Other Ambulatory Visit: Payer: Self-pay

## 2022-05-09 ENCOUNTER — Other Ambulatory Visit: Payer: Self-pay

## 2022-08-03 ENCOUNTER — Telehealth: Payer: No Typology Code available for payment source | Admitting: Physician Assistant

## 2022-08-03 DIAGNOSIS — L237 Allergic contact dermatitis due to plants, except food: Secondary | ICD-10-CM | POA: Diagnosis not present

## 2022-08-04 ENCOUNTER — Other Ambulatory Visit: Payer: Self-pay

## 2022-08-04 MED ORDER — PREDNISONE 10 MG PO TABS
ORAL_TABLET | ORAL | 0 refills | Status: DC
  Filled 2022-08-04: qty 37, 14d supply, fill #0

## 2022-08-04 NOTE — Progress Notes (Signed)
E-Visit for Poison Ivy  We are sorry that you are not feeing well.  Here is how we plan to help!  Based on what you have shared with me it looks like you have had an allergic reaction to the oily resin from a group of plants.  This resin is very sticky, so it easily attaches to your skin, clothing, tools equipment, and pet's fur.    This blistering rash is often called poison ivy rash although it can come from contact with the leaves, stems and roots of poison ivy, poison oak and poison sumac.  The oily resin contains urushiol (u-ROO-she-ol) that produces a skin rash on exposed skin.  The severity of the rash depends on the amount of urushiol that gets on your skin.  A section of skin with more urushiol on it may develop a rash sooner.  The rash usually develops 12-48 hours after exposure and can last two to three weeks.  Your skin must come in direct contact with the plant's oil to be affected.  Blister fluid doesn't spread the rash.  However, if you come into contact with a piece of clothing or pet fur that has urushiol on it, the rash may spread out.  You can also transfer the oil to other parts of your body with your fingers.  Often the rash looks like a straight line because of the way the plant brushes against your skin.  Since your rash is widespread or has resulted in a large number of blisters, I have prescribed an oral corticosteroid.  Please follow these recommendations:  I have sent a prednisone dose pack to your chosen pharmacy. Be sure to follow the instructions carefully and complete the entire prescription. You may use Benadryl or Caladryl topical lotions to sooth the itch and remember cool, not hot, showers and baths can help relieve the itching!  Place cool, wet compresses on the affected area for 15-30 minutes several times a day.  You may also take oral antihistamines, such as diphenhydramine (Benadryl, others), which may also help you sleep better.  Watch your skin for any purulent  (pus) drainage or red streaking from the site.  If this occurs, contact your provider.  You may require an antibiotic for a skin infection.  Make sure that the clothes you were wearing as well as any towels or sheets that may have come in contact with the oil (urushiol) are washed in detergent and hot water.       I have developed the following plan to treat your condition I am prescribing a two week course of steroids (37 tablets of 10 mg prednisone).  Days 1-4 take 4 tablets (40 mg) daily  Days 5-8 take 3 tablets (30 mg) daily, Days 9-11 take 2 tablets (20 mg) daily, Days 12-14 take 1 tablet (10 mg) daily.    What can you do to prevent this rash?  Avoid the plants.  Learn how to identify poison ivy, poison oak and poison sumac in all seasons.  When hiking or engaging in other activities that might expose you to these plants, try to stay on cleared pathways.  If camping, make sure you pitch your tent in an area free of these plants.  Keep pets from running through wooded areas so that urushiol doesn't accidentally stick to their fur, which you may touch.  Remove or kill the plants.  In your yard, you can get rid of poison ivy by applying an herbicide or pulling it out of   the ground, including the roots, while wearing heavy gloves.  Afterward remove the gloves and thoroughly wash them and your hands.  Don't burn poison ivy or related plants because the urushiol can be carried by smoke.  Wear protective clothing.  If needed, protect your skin by wearing socks, boots, pants, long sleeves and vinyl gloves.  Wash your skin right away.  Washing off the oil with soap and water within 30 minutes of exposure may reduce your chances of getting a poison ivy rash.  Even washing after an hour or so can help reduce the severity of the rash.  If you walk through some poison ivy and then later touch your shoes, you may get some urushiol on your hands, which may then transfer to your face or body by touching or  rubbing.  If the contaminated object isn't cleaned, the urushiol on it can still cause a skin reaction years later.    Be careful not to reuse towels after you have washed your skin.  Also carefully wash clothing in detergent and hot water to remove all traces of the oil.  Handle contaminated clothing carefully so you don't transfer the urushiol to yourself, furniture, rugs or appliances.  Remember that pets can carry the oil on their fur and paws.  If you think your pet may be contaminated with urushiol, put on some long rubber gloves and give your pet a bath.  Finally, be careful not to burn these plants as the smoke can contain traces of the oil.  Inhaling the smoke may result in difficulty breathing. If that occurred you should see a physician as soon as possible.  See your doctor right away if:  The reaction is severe or widespread You inhaled the smoke from burning poison ivy and are having difficulty breathing Your skin continues to swell The rash affects your eyes, mouth or genitals Blisters are oozing pus You develop a fever greater than 100 F (37.8 C) The rash doesn't get better within a few weeks.  If you scratch the poison ivy rash, bacteria under your fingernails may cause the skin to become infected.  See your doctor if pus starts oozing from the blisters.  Treatment generally includes antibiotics.  Poison ivy treatments are usually limited to self-care methods.  And the rash typically goes away on its own in two to three weeks.     If the rash is widespread or results in a large number of blisters, your doctor may prescribe an oral corticosteroid, such as prednisone.  If a bacterial infection has developed at the rash site, your doctor may give you a prescription for an oral antibiotic.  MAKE SURE YOU  Understand these instructions. Will watch your condition. Will get help right away if you are not doing well or get worse.   Thank you for choosing an e-visit.  Your  e-visit answers were reviewed by a board certified advanced clinical practitioner to complete your personal care plan. Depending upon the condition, your plan could have included both over the counter or prescription medications.  Please review your pharmacy choice. Make sure the pharmacy is open so you can pick up prescription now. If there is a problem, you may contact your provider through MyChart messaging and have the prescription routed to another pharmacy.  Your safety is important to us. If you have drug allergies check your prescription carefully.   For the next 24 hours you can use MyChart to ask questions about today's visit, request a non-urgent   call back, or ask for a work or school excuse. You will get an email in the next two days asking about your experience. I hope that your e-visit has been valuable and will speed your recovery.   I provided 5 minutes of non face-to-face time during this encounter for chart review and documentation.   

## 2022-08-09 ENCOUNTER — Ambulatory Visit
Admission: EM | Admit: 2022-08-09 | Discharge: 2022-08-09 | Disposition: A | Discharge status: Home / Self Care | Payer: No Typology Code available for payment source | Attending: Family Medicine | Admitting: Family Medicine

## 2022-08-09 DIAGNOSIS — L241 Irritant contact dermatitis due to oils and greases: Secondary | ICD-10-CM | POA: Diagnosis not present

## 2022-08-09 MED ORDER — TRIAMCINOLONE ACETONIDE 0.5 % EX OINT: 1.0000 | TOPICAL_OINTMENT | Freq: Two times a day (BID) | CUTANEOUS | 3 refills | Status: DC

## 2022-08-09 MED ORDER — DEXAMETHASONE SODIUM PHOSPHATE 10 MG/ML IJ SOLN
10.0000 mg | Freq: Once | INTRAMUSCULAR | Status: AC
  Administered 2022-08-09: 10 mg via INTRAMUSCULAR

## 2022-08-09 NOTE — Discharge Instructions (Addendum)
Stop by the pharmacy to pick up your prescription.  You can resume your steroid taper tomorrow.  You can apply some bacitracin to the leg to prevent any secondary infections.

## 2022-08-09 NOTE — ED Provider Notes (Signed)
[facility]    CSN: [id number] Arrival date & time: 08/09/22  1308      History   Chief Complaint Chief Complaint  Patient presents with   Rash    HPI Oscar Allen is a 36 y.o. male.   HPI  Oscar Allen reports that he and his wife bought a new townhouse in the family that live there before left to play house.  Says he picked it up to move it in the plants went across his shin about 2 weeks ago.  Soon after he started having a skin rash.  He recently saw his PCP did put him on a prednisone taper.  He is on day 5 for that.  He has been scratching the rash.  The rash is located on both of his legs and somewhat on his chest.  He has not had a fever, increased redness, nausea, vomiting or headache.  He has otherwise been well.   Past Medical History:  Diagnosis Date   Gout    Thyroid disorder     Patient Active Problem List   Diagnosis Date Noted   Family history of rheumatoid arthritis 07/28/2018   Hyperuricemia 07/28/2018   Vitreous floater, bilateral 05/21/2018   Dry eye syndrome of both lacrimal glands 05/21/2018   Peroneal tendinitis 02/12/2018   Overweight (BMI 25.0-29.9) 09/18/2017   Bone spur of left ankle 03/28/2016   Insomnia, persistent 08/26/2015   History of hypertension 08/26/2015   Adult hypothyroidism 08/26/2015   B12 deficiency 08/26/2015   Gout of big toe 08/26/2015   Vitamin D deficiency 08/26/2015    Past Surgical History:  Procedure Laterality Date   EYE SURGERY Bilateral 2012   [facility]   FOOT SURGERY Right 2000   LASIK Bilateral 2012   [facility] [facility]   MANDIBLE FRACTURE SURGERY  2001   RETINAL DETACHMENT SURGERY Right 2012   Laser for repair of non-traumatic RD   RHINOPLASTY  2013       Home Medications    Prior to Admission medications   Medication Sig Start Date End Date Taking? Authorizing Provider  Acetaminophen 325 MG CAPS Take 2 capsules by mouth as needed.   Yes [provider]  allopurinol  (ZYLOPRIM) 100 MG tablet 2 and a half  tablets Orally Once a day 90 days 04/28/22  Yes   colchicine 0.6 MG tablet Take 2 tablets (1.2 mg total) by mouth daily as needed (and repat in one hour if no resolution of pain). 03/02/18  Yes Sowles, Drue Stager, MD  diazepam (VALIUM) 10 MG tablet Take 1 tablet by mouth 1 hour prior to procedure 07/12/21  Yes   doxycycline (VIBRAMYCIN) 100 MG capsule 1 capsule PO BID 03/14/22  Yes   ibuprofen (ADVIL) 200 MG tablet Take 2 tablets by mouth as needed.   Yes [provider]  levothyroxine (SYNTHROID) 75 MCG tablet TAKE 1 TABLET BY MOUTH ONCE A DAY 12/20/21 12/20/22 Yes Sowles, Drue Stager, MD  Methylcobalamin 1000 MCG SUBL Place 1 tablet under the tongue daily. 10/17/16  Yes Sowles, Drue Stager, MD  Phentermine-Topiramate (QSYMIA) 3.75-23 MG CP24 Take 1 capsule by mouth daily at 12 noon. 12/20/21  Yes Sowles, Drue Stager, MD  Phentermine-Topiramate (QSYMIA) 7.5-46 MG CP24 Take 1 capsule by mouth daily at 12 noon. 12/20/21  Yes Sowles, Drue Stager, MD  predniSONE (DELTASONE) 10 MG tablet Days 1-4 take 4 tablets (40 mg) daily  Days 5-8 take 3 tablets (30 mg) daily, Days 9-11 take 2 tablets (20 mg) daily, Days  12-14 take 1 tablet (10 mg) daily. 08/04/22  Yes Mar Daring, PA-C  traMADol (ULTRAM) 50 MG tablet Take 1 tablet (50 mg total) by mouth every 6 (six) hours. 02/21/22  Yes   triamcinolone ointment (KENALOG) 0.5 % Apply 1 Application topically 2 (two) times daily. For moderate to severe eczema.  Do not use for more than 1 week at a time. 08/09/22  Yes Lyndee Hensen, DO    Family History Family History  Problem Relation Age of Onset   Anxiety disorder Mother    Rheum arthritis Mother    Hypothyroidism Father    Heart Problems Brother    Psoriasis Brother    Rheum arthritis Maternal Grandmother    Pancreatic cancer Maternal Grandfather    Liver cancer Paternal Grandfather    Glaucoma Paternal Grandfather     Social History Social History   Tobacco Use    Smoking status: Never   Smokeless tobacco: Never  Vaping Use   Vaping Use: Never used  Substance Use Topics   Alcohol use: Yes    Alcohol/week: 0.0 standard drinks of alcohol    Comment: OCCASIONALLY   Drug use: No     Allergies   Rhus coriaria [sumac]   Review of Systems Review of Systems : :negative unless otherwise stated in HPI.      Physical Exam Triage Vital Signs ED Triage Vitals  Enc Vitals Group     BP 08/09/22 1340 (!) 131/97     Pulse Rate 08/09/22 1340 67     Resp --      Temp 08/09/22 1340 98.3 F (36.8 C)     Temp Source 08/09/22 1340 Oral     SpO2 08/09/22 1340 100 %     Weight 08/09/22 1337 215 lb (97.5 kg)     Height 08/09/22 1337 '5\' 10"'$  (1.778 m)     Head Circumference --      Peak Flow --      Pain Score 08/09/22 1337 2     Pain Loc --      Pain Edu? --      Excl. in [place]? --    No data found.  Updated Vital Signs BP (!) 131/97 (BP Location: Left Arm)   Pulse 67   Temp 98.3 F (36.8 C) (Oral)   Ht '5\' 10"'$  (1.778 m)   Wt 97.5 kg   SpO2 100%   BMI 30.85 kg/m   Visual Acuity Right Eye Distance:   Left Eye Distance:   Bilateral Distance:    Right Eye Near:   Left Eye Near:    Bilateral Near:     Physical Exam  GEN: pleasant well appearing male CV: regular rate RESP: no increased work of breathing SKIN: warm, dry, blisterlike rash on bilateral lower extremities with excoriation and erythema, there is some mild bleeding present.  There is no warmth.   Left posterior lower leg   Right anterior shin:     UC Treatments / Results  Labs (all labs ordered are listed, but only abnormal results are displayed) Labs Reviewed - No data to display  EKG   Radiology No results found.  Procedures Procedures (including critical care time)  Medications Ordered in UC Medications  dexamethasone (DECADRON) injection 10 mg (10 mg Intramuscular Given 08/09/22 1402)    Initial Impression / Assessment and Plan / UC Course  I have  reviewed the triage vital signs and the nursing notes.  Pertinent labs & imaging results that were available  during my care of the patient were reviewed by me and considered in my medical decision making (see chart for details).     Patient is a 36 year old male who presents for right skin rashes consistent with contact dermatitis.  He is on day 5 of his steroid taper however he is not improving.  Given Decadron injection today.  He is to resume his steroid taper tomorrow.  Advised to follow-up with his PCP.  Reviewed signs symptoms of secondary skin infections.  Precautions provided.  Discussed MDM, treatment plan and plan for follow-up with patient/parent who agrees with plan.   Final Clinical Impressions(s) / UC Diagnoses   Final diagnoses:  Irritant contact dermatitis due to oils     Discharge Instructions      Stop by the pharmacy to pick up your prescription.  You can resume your steroid taper tomorrow.  You can apply some bacitracin to the leg to prevent any secondary infections.     ED Prescriptions     Medication Sig Dispense Auth. Provider   triamcinolone ointment (KENALOG) 0.5 % Apply 1 Application topically 2 (two) times daily. For moderate to severe eczema.  Do not use for more than 1 week at a time. 60 g Lyndee Hensen, DO      PDMP not reviewed this encounter.   Lyndee Hensen, DO 08/09/22 1434

## 2022-08-09 NOTE — ED Triage Notes (Signed)
Patient reports that he has poison ivy.   Patient reports that he did have an Evisit with PCP and was given a 14 day course of prednisone which has not helped.   Patient reports that he has had this for about 2 weeks.   Patient reports that he usually has to have an injection when this has happened before.   Patient reports he has not taken his dose of prednisone today.

## 2022-08-12 ENCOUNTER — Other Ambulatory Visit: Payer: Self-pay

## 2022-08-12 ENCOUNTER — Ambulatory Visit (INDEPENDENT_AMBULATORY_CARE_PROVIDER_SITE_OTHER): Payer: No Typology Code available for payment source | Admitting: Nurse Practitioner

## 2022-08-12 ENCOUNTER — Other Ambulatory Visit: Payer: Self-pay | Admitting: Family Medicine

## 2022-08-12 ENCOUNTER — Encounter: Payer: Self-pay | Admitting: Family Medicine

## 2022-08-12 ENCOUNTER — Encounter: Payer: Self-pay | Admitting: Nurse Practitioner

## 2022-08-12 VITALS — BP 132/80 | Temp 98.1°F | Resp 18 | Ht 70.0 in | Wt 219.7 lb

## 2022-08-12 DIAGNOSIS — E039 Hypothyroidism, unspecified: Secondary | ICD-10-CM

## 2022-08-12 DIAGNOSIS — L237 Allergic contact dermatitis due to plants, except food: Secondary | ICD-10-CM

## 2022-08-12 MED ORDER — HYDROXYZINE PAMOATE 25 MG PO CAPS
25.0000 mg | ORAL_CAPSULE | Freq: Three times a day (TID) | ORAL | 0 refills | Status: DC | PRN
  Filled 2022-08-12: qty 30, 10d supply, fill #0

## 2022-08-12 MED ORDER — LEVOTHYROXINE SODIUM 75 MCG PO TABS
ORAL_TABLET | Freq: Every day | ORAL | 1 refills | Status: DC
  Filled 2022-08-12: qty 90, 90d supply, fill #0
  Filled 2022-11-10: qty 90, 90d supply, fill #1

## 2022-08-12 MED ORDER — PREDNISONE 10 MG PO TABS
ORAL_TABLET | ORAL | 0 refills | Status: DC
  Filled 2022-08-12 (×2): qty 42, 12d supply, fill #0

## 2022-08-12 MED ORDER — ALLOPURINOL 100 MG PO TABS
ORAL_TABLET | ORAL | 0 refills | Status: DC
  Filled 2022-08-12: qty 225, 90d supply, fill #0

## 2022-08-12 MED ORDER — TRIAMCINOLONE ACETONIDE 0.5 % EX OINT
1.0000 | TOPICAL_OINTMENT | Freq: Two times a day (BID) | CUTANEOUS | 1 refills | Status: DC
  Filled 2022-08-12: qty 60, 30d supply, fill #0

## 2022-08-12 NOTE — Telephone Encounter (Signed)
Sch'd appt with Almyra Free for the dermatitis issue for today. Pt would like to combine his cpe with med refill in July. Did inform pt that it is a possibility that insurance may not cover the complete visit and he verbalized understanding.

## 2022-08-12 NOTE — Progress Notes (Signed)
BP 132/80   Temp 98.1 F (36.7 C)   Resp 18   Ht 5' 10"  (1.778 m)   Wt 219 lb 11.2 oz (99.7 kg)   BMI 31.52 kg/m    Subjective:    Patient ID: Oscar Allen, male    DOB: 1986-01-30, 36 y.o.   MRN: [id number]  HPI: Oscar Allen is a 36 y.o. male  Chief Complaint  Patient presents with   Rash    Poison ivy     Poison ivy dermatitis: Patient was seen at urgent care on 08/09/2022. He was give a steroid taper, kenalog cream and decadron injection. He reports he has had no improvement.  He reports this all started a few weeks ago. He says that he recently purchased a new home and was working in the yard when he came in contact with some brush.  He thinks it might have been poison ivy or [place] creeper.  He says he has followed the directions he was given from previous providers but is still itching really badly.  No signs of infection noted. Will extend use of kenalog cream, prescribe extended steroid taper and give prescription for hydroxyzine for itching.   Hypothyroidism: His last TSH was 2.17.  He is currently taking levothyroxine 75 mcg daily.  He denies any palpitations, constipation, fatigue or heat or cold intolerance.  Relevant past medical, surgical, family and social history reviewed and updated as indicated. Interim medical history since our last visit reviewed. Allergies and medications reviewed and updated.  Review of Systems  Constitutional: Negative for fever or weight change.  Respiratory: Negative for cough and shortness of breath.   Cardiovascular: Negative for chest pain or palpitations.  Gastrointestinal: Negative for abdominal pain, no bowel changes.  Musculoskeletal: Negative for gait problem or joint swelling.  Skin: positive for rash.  Neurological: Negative for dizziness or headache.  No other specific complaints in a complete review of systems (except as listed in HPI above).      Objective:    BP 132/80   Temp 98.1 F (36.7 C)    Resp 18   Ht 5' 10"  (1.778 m)   Wt 219 lb 11.2 oz (99.7 kg)   BMI 31.52 kg/m   Wt Readings from Last 3 Encounters:  08/12/22 219 lb 11.2 oz (99.7 kg)  08/09/22 215 lb (97.5 kg)  12/20/21 218 lb (98.9 kg)    Physical Exam  Constitutional: Patient appears well-developed and well-nourished. No distress.  HEENT: head atraumatic, normocephalic, pupils equal and reactive to light, neck supple Cardiovascular: Normal rate, regular rhythm and normal heart sounds.  No murmur heard. No BLE edema. Pulmonary/Chest: Effort normal and breath sounds normal. No respiratory distress. Abdominal: Soft.  There is no tenderness. Skin: rash noted on bilateral lower extremities with excoriation and erythema, also noted some spots on hands Psychiatric: Patient has a normal mood and affect. behavior is normal. Judgment and thought content normal.  Results for orders placed or performed in visit on 12/20/21  Lipid panel  Result Value Ref Range   Cholesterol 211 (H) <200 mg/dL   HDL 53 > OR = 40 mg/dL   Triglycerides 95 <150 mg/dL   LDL Cholesterol (Calc) 138 (H) mg/dL (calc)   Total CHOL/HDL Ratio 4.0 <5.0 (calc)   Non-HDL Cholesterol (Calc) 158 (H) <130 mg/dL (calc)  CBC with Differential/Platelet  Result Value Ref Range   WBC 5.5 3.8 - 10.8 Thousand/uL   RBC 5.08 4.20 - 5.80 Million/uL   Hemoglobin  14.5 13.2 - 17.1 g/dL   HCT 43.2 38.5 - 50.0 %   MCV 85.0 80.0 - 100.0 fL   MCH 28.5 27.0 - 33.0 pg   MCHC 33.6 32.0 - 36.0 g/dL   RDW 13.4 11.0 - 15.0 %   Platelets 274 140 - 400 Thousand/uL   MPV 10.0 7.5 - 12.5 fL   Neutro Abs 2,558 1,500 - 7,800 cells/uL   Lymphs Abs 2,393 850 - 3,900 cells/uL   Absolute Monocytes 391 200 - 950 cells/uL   Eosinophils Absolute 110 15 - 500 cells/uL   Basophils Absolute 50 0 - 200 cells/uL   Neutrophils Relative % 46.5 %   Total Lymphocyte 43.5 %   Monocytes Relative 7.1 %   Eosinophils Relative 2.0 %   Basophils Relative 0.9 %  COMPLETE METABOLIC PANEL WITH GFR   Result Value Ref Range   Glucose, Bld 77 65 - 99 mg/dL   BUN 9 7 - 25 mg/dL   Creat 0.89 0.60 - 1.26 mg/dL   eGFR 114 > OR = 60 mL/min/1.50m   BUN/Creatinine Ratio NOT APPLICABLE 6 - 22 (calc)   Sodium 142 135 - 146 mmol/L   Potassium 4.0 3.5 - 5.3 mmol/L   Chloride 105 98 - 110 mmol/L   CO2 29 20 - 32 mmol/L   Calcium 9.8 8.6 - 10.3 mg/dL   Total Protein 6.9 6.1 - 8.1 g/dL   Albumin 4.7 3.6 - 5.1 g/dL   Globulin 2.2 1.9 - 3.7 g/dL (calc)   AG Ratio 2.1 1.0 - 2.5 (calc)   Total Bilirubin 0.7 0.2 - 1.2 mg/dL   Alkaline phosphatase (APISO) 37 36 - 130 U/L   AST 20 10 - 40 U/L   ALT 36 9 - 46 U/L  Hemoglobin A1c  Result Value Ref Range   Hgb A1c MFr Bld 5.1 <5.7 % of total Hgb   Mean Plasma Glucose 100 mg/dL   eAG (mmol/L) 5.5 mmol/L  VITAMIN D 25 Hydroxy (Vit-D Deficiency, Fractures)  Result Value Ref Range   Vit D, 25-Hydroxy 54 30 - 100 ng/mL  Vitamin B12  Result Value Ref Range   Vitamin B-12 429 200 - 1,100 pg/mL  TSH  Result Value Ref Range   TSH 2.17 0.40 - 4.50 mIU/L  Uric acid  Result Value Ref Range   Uric Acid, Serum 6.6 4.0 - 8.0 mg/dL      Assessment & Plan:   Problem List Items Addressed This Visit       Endocrine   Adult hypothyroidism    Continue taking levothyroxine 75 mcg daily.      Relevant Medications   levothyroxine (SYNTHROID) 75 MCG tablet   Other Visit Diagnoses     Poison ivy dermatitis    -  Primary   start extended steroid taper, continue using tramcinolone ointment, hydroxyzine for itching.    Relevant Medications   predniSONE (DELTASONE) 10 MG tablet   hydrOXYzine (VISTARIL) 25 MG capsule   triamcinolone ointment (KENALOG) 0.5 %        Follow up plan: Return if symptoms worsen or fail to improve.

## 2022-08-12 NOTE — Assessment & Plan Note (Signed)
Continue taking levothyroxine 75 mcg daily. 

## 2022-08-22 ENCOUNTER — Encounter: Payer: Self-pay | Admitting: Nurse Practitioner

## 2022-08-25 ENCOUNTER — Encounter: Payer: Self-pay | Admitting: Family Medicine

## 2022-08-27 NOTE — Progress Notes (Unsigned)
Name: Oscar Allen   MRN: [id number]    DOB: [birth year]   Date:08/28/2022       Progress Note  Subjective  Chief Complaint  Rash  HPI  Rash , Vania Rea developed a pruriginous rash on right lower leg about one month ago, one week after he worked in his yard, he has been treated by urgent care and once in our office and has received steroid injections, hydroxizine and also two rounds of prednisone taper. He states hydroxizine improves symptoms temporarily , prednisone only helps while on high dose. The rash has spread and is changing, on his legs the rash is round with clear center but topical antifungal does not seem to help, on his groin and lower abdomen is more like sand paper and very itchy, also has one spot on his left flank area that is clean in the center. He has noticed some decrease in appetite and early satiety. Mother has possible diagnosis of celiac, his bowel movements are now every two day instead of daily but denies blood or mucus in stools. He has been tired but also unable to sleep well due to itching.   He has a dermatologist and advised him to contact them for a sooner visit since the rash is changing and spreading   Patient Active Problem List   Diagnosis Date Noted   Family history of rheumatoid arthritis 07/28/2018   Hyperuricemia 07/28/2018   Vitreous floater, bilateral 05/21/2018   Dry eye syndrome of both lacrimal glands 05/21/2018   Peroneal tendinitis 02/12/2018   Overweight (BMI 25.0-29.9) 09/18/2017   Bone spur of left ankle 03/28/2016   Insomnia, persistent 08/26/2015   History of hypertension 08/26/2015   Adult hypothyroidism 08/26/2015   B12 deficiency 08/26/2015   Gout of big toe 08/26/2015   Vitamin D deficiency 08/26/2015    Past Surgical History:  Procedure Laterality Date   EYE SURGERY Bilateral 2012   [facility]   FOOT SURGERY Right 2000   LASIK Bilateral 2012   [place] [facility]   MANDIBLE FRACTURE SURGERY  2001   RETINAL DETACHMENT  SURGERY Right 2012   Laser for repair of non-traumatic RD   RHINOPLASTY  2013    Family History  Problem Relation Age of Onset   Anxiety disorder Mother    Rheum arthritis Mother    Hypothyroidism Father    Heart Problems Brother    Psoriasis Brother    Rheum arthritis Maternal Grandmother    Pancreatic cancer Maternal Grandfather    Liver cancer Paternal Grandfather    Glaucoma Paternal Grandfather     Social History   Tobacco Use   Smoking status: Never   Smokeless tobacco: Never  Substance Use Topics   Alcohol use: Yes    Alcohol/week: 0.0 standard drinks of alcohol    Comment: OCCASIONALLY     Current Outpatient Medications:    Acetaminophen 325 MG CAPS, Take 2 capsules by mouth as needed., Disp: , Rfl:    allopurinol (ZYLOPRIM) 100 MG tablet, 2 and a half  tablets Orally Once a day 90 days, Disp: 225 tablet, Rfl: 0   colchicine 0.6 MG tablet, Take 2 tablets (1.2 mg total) by mouth daily as needed (and repat in one hour if no resolution of pain)., Disp: 30 tablet, Rfl: 0   famotidine (PEPCID) 20 MG tablet, Take 1 tablet (20 mg total) by mouth 2 (two) times daily., Disp: 100 tablet, Rfl: 0   ibuprofen (ADVIL) 200 MG tablet, Take 2 tablets by  mouth as needed., Disp: , Rfl:    levothyroxine (SYNTHROID) 75 MCG tablet, TAKE 1 TABLET BY MOUTH ONCE A DAY, Disp: 90 tablet, Rfl: 1   terbinafine (LAMISIL) 250 MG tablet, Take 1 tablet (250 mg total) by mouth daily., Disp: 14 tablet, Rfl: 0   triamcinolone cream (KENALOG) 0.1 %, Apply 1 Application topically 2 (two) times daily., Disp: 453.6 g, Rfl: 0   hydrOXYzine (VISTARIL) 25 MG capsule, Take 1-2 capsules (25-50 mg total) by mouth every 6 (six) hours as needed., Disp: 100 capsule, Rfl: 0  Allergies  Allergen Reactions   Rhus Coriaria [Sumac] Hives    I personally reviewed active problem list, medication list, allergies, family history, social history, health maintenance with the patient/caregiver today.   ROS  Ten  systems reviewed and is negative except as mentioned in HPI   Objective  Vitals:   08/28/22 0801  BP: 132/72  Pulse: 68  Resp: 16  SpO2: 98%  Weight: 220 lb (99.8 kg)  Height: 5' 10"  (1.778 m)    Body mass index is 31.57 kg/m.  Physical Exam  Constitutional: Patient appears well-developed and well-nourished. Obese  No distress.  HEENT: head atraumatic, normocephalic, pupils equal and reactive to light, neck supple Cardiovascular: Normal rate, regular rhythm and normal heart sounds.  No murmur heard. No BLE edema. Pulmonary/Chest: Effort normal and breath sounds normal. No respiratory distress. Abdominal: Soft.  There is no tenderness. Skin: pictures of rash attached, first one of abdomen, second of lower right leg and third of flank area, all different rashes, some macular, some area papular, dry rash, no oozing Psychiatric: Patient has a normal mood and affect. behavior is normal. Judgment and thought content normal.   PHQ2/9:    08/28/2022    8:00 AM 08/12/2022    3:43 PM 08/12/2022    3:42 PM 12/20/2021   11:19 AM 05/17/2021    1:34 PM  Depression screen PHQ 2/9  Decreased Interest 0 0 0 0 0  Down, Depressed, Hopeless 2 0 0 0 0  PHQ - 2 Score 2 0 0 0 0  Altered sleeping 3 0 0 0   Tired, decreased energy 3 0 0 3   Change in appetite 0 0 0 1   Feeling bad or failure about yourself  0 0 0 0   Trouble concentrating 1 0 0 0   Moving slowly or fidgety/restless 0 0 0 0   Suicidal thoughts 0 0 0 0   PHQ-9 Score 9 0 0 4   Difficult doing work/chores  Not difficult at all Not difficult at all      phq 9 is positive   Fall Risk:    08/28/2022    8:00 AM 08/12/2022    3:39 PM 12/20/2021   11:18 AM 05/17/2021    1:34 PM 11/16/2020    1:45 PM  Fall Risk   Falls in the past year? 0 0 0 0 0  Number falls in past yr: 0 0 0 0 0  Injury with Fall? 0 0 0 0 0  Risk for fall due to : No Fall Risks  No Fall Risks    Follow up Falls prevention discussed  Falls prevention  discussed        Functional Status Survey: Is the patient deaf or have difficulty hearing?: No Does the patient have difficulty seeing, even when wearing glasses/contacts?: No Does the patient have difficulty concentrating, remembering, or making decisions?: No Does the patient have difficulty walking or  climbing stairs?: No Does the patient have difficulty dressing or bathing?: No Does the patient have difficulty doing errands alone such as visiting a doctor's office or shopping?: No    Assessment & Plan  1. Rash in adult  - terbinafine (LAMISIL) 250 MG tablet; Take 1 tablet (250 mg total) by mouth daily.  Dispense: 14 tablet; Refill: 0 - CBC with Differential/Platelet - COMPLETE METABOLIC PANEL WITH GFR - Celiac Disease Panel - TSH - Sed Rate (ESR) - C-reactive protein - hydrOXYzine (VISTARIL) 25 MG capsule; Take 1-2 capsules (25-50 mg total) by mouth every 6 (six) hours as needed.  Dispense: 100 capsule; Refill: 0 - famotidine (PEPCID) 20 MG tablet; Take 1 tablet (20 mg total) by mouth 2 (two) times daily.  Dispense: 100 tablet; Refill: 0 - triamcinolone cream (KENALOG) 0.1 %; Apply 1 Application topically 2 (two) times daily.  Dispense: 453.6 g; Refill: 0   We will look for systemic causes, add H2 blocker, try to hold off on another prednisone taper He can start lamisil but asked him to call me back before he takes second dose of lamisil tomorrow in case his liver enzymes are high   2. Pruritus  - hydrOXYzine (VISTARIL) 25 MG capsule; Take 1-2 capsules (25-50 mg total) by mouth every 6 (six) hours as needed.  Dispense: 100 capsule; Refill: 0 - famotidine (PEPCID) 20 MG tablet; Take 1 tablet (20 mg total) by mouth 2 (two) times daily.  Dispense: 100 tablet; Refill: 0 - triamcinolone cream (KENALOG) 0.1 %; Apply 1 Application topically 2 (two) times daily.  Dispense: 453.6 g; Refill: 0

## 2022-08-28 ENCOUNTER — Ambulatory Visit (INDEPENDENT_AMBULATORY_CARE_PROVIDER_SITE_OTHER): Payer: No Typology Code available for payment source | Admitting: Family Medicine

## 2022-08-28 ENCOUNTER — Other Ambulatory Visit: Payer: Self-pay

## 2022-08-28 ENCOUNTER — Encounter: Payer: Self-pay | Admitting: Family Medicine

## 2022-08-28 VITALS — BP 132/72 | HR 68 | Resp 16 | Ht 70.0 in | Wt 220.0 lb

## 2022-08-28 DIAGNOSIS — L299 Pruritus, unspecified: Secondary | ICD-10-CM

## 2022-08-28 DIAGNOSIS — R21 Rash and other nonspecific skin eruption: Secondary | ICD-10-CM | POA: Diagnosis not present

## 2022-08-28 DIAGNOSIS — L237 Allergic contact dermatitis due to plants, except food: Secondary | ICD-10-CM

## 2022-08-28 MED ORDER — FAMOTIDINE 20 MG PO TABS
20.0000 mg | ORAL_TABLET | Freq: Two times a day (BID) | ORAL | 0 refills | Status: DC
  Filled 2022-08-28: qty 100, 50d supply, fill #0

## 2022-08-28 MED ORDER — TRIAMCINOLONE ACETONIDE 0.1 % EX CREA
1.0000 | TOPICAL_CREAM | Freq: Two times a day (BID) | CUTANEOUS | 0 refills | Status: AC
  Filled 2022-08-28: qty 454, 20d supply, fill #0

## 2022-08-28 MED ORDER — TERBINAFINE HCL 250 MG PO TABS
250.0000 mg | ORAL_TABLET | Freq: Every day | ORAL | 0 refills | Status: DC
  Filled 2022-08-28: qty 14, 14d supply, fill #0

## 2022-08-28 MED ORDER — DEXAMETHASONE SODIUM PHOSPHATE 10 MG/ML IJ SOLN
10.0000 mg | Freq: Once | INTRAMUSCULAR | Status: AC
  Administered 2022-08-28: 10 mg via INTRAMUSCULAR

## 2022-08-28 MED ORDER — HYDROXYZINE PAMOATE 25 MG PO CAPS
25.0000 mg | ORAL_CAPSULE | Freq: Four times a day (QID) | ORAL | 0 refills | Status: DC | PRN
  Filled 2022-08-28: qty 100, 13d supply, fill #0

## 2022-08-29 LAB — COMPLETE METABOLIC PANEL WITH GFR
AG Ratio: 2.3 (calc) (ref 1.0–2.5)
ALT: 32 U/L (ref 9–46)
AST: 18 U/L (ref 10–40)
Albumin: 4.6 g/dL (ref 3.6–5.1)
Alkaline phosphatase (APISO): 35 U/L — ABNORMAL LOW (ref 36–130)
BUN: 12 mg/dL (ref 7–25)
CO2: 29 mmol/L (ref 20–32)
Calcium: 9.3 mg/dL (ref 8.6–10.3)
Chloride: 105 mmol/L (ref 98–110)
Creat: 0.91 mg/dL (ref 0.60–1.26)
Globulin: 2 g/dL (calc) (ref 1.9–3.7)
Glucose, Bld: 87 mg/dL (ref 65–99)
Potassium: 4 mmol/L (ref 3.5–5.3)
Sodium: 142 mmol/L (ref 135–146)
Total Bilirubin: 0.7 mg/dL (ref 0.2–1.2)
Total Protein: 6.6 g/dL (ref 6.1–8.1)
eGFR: 111 mL/min/{1.73_m2} (ref >=60)

## 2022-08-29 LAB — CBC WITH DIFFERENTIAL/PLATELET
Absolute Monocytes: 328 cells/uL (ref 200–950)
Basophils Absolute: 42 cells/uL (ref 0–200)
Basophils Relative: 0.8 %
Eosinophils Absolute: 317 cells/uL (ref 15–500)
Eosinophils Relative: 6.1 %
HCT: 44.3 % (ref 38.5–50.0)
Hemoglobin: 14.8 g/dL (ref 13.2–17.1)
Lymphs Abs: 1737 cells/uL (ref 850–3900)
MCH: 29.1 pg (ref 27.0–33.0)
MCHC: 33.4 g/dL (ref 32.0–36.0)
MCV: 87 fL (ref 80.0–100.0)
MPV: 9.9 fL (ref 7.5–12.5)
Monocytes Relative: 6.3 %
Neutro Abs: 2777 cells/uL (ref 1500–7800)
Neutrophils Relative %: 53.4 %
Platelets: 242 10*3/uL (ref 140–400)
RBC: 5.09 10*6/uL (ref 4.20–5.80)
RDW: 13.3 % (ref 11.0–15.0)
Total Lymphocyte: 33.4 %
WBC: 5.2 10*3/uL (ref 3.8–10.8)

## 2022-08-29 LAB — CELIAC DISEASE PANEL
(tTG) Ab, IgA: <1 U/mL
(tTG) Ab, IgG: <1 U/mL
Gliadin IgA: <1 U/mL
Gliadin IgG: <1 U/mL
Immunoglobulin A: 147 mg/dL (ref 47–310)

## 2022-08-29 LAB — TSH: TSH: 1.87 mIU/L (ref 0.40–4.50)

## 2022-08-29 LAB — C-REACTIVE PROTEIN: CRP: 1.7 mg/L (ref <8.0)

## 2022-08-29 LAB — SEDIMENTATION RATE: Sed Rate: 2 mm/h (ref 0–15)

## 2022-10-14 ENCOUNTER — Encounter: Payer: Self-pay | Admitting: Family Medicine

## 2022-10-17 ENCOUNTER — Ambulatory Visit: Payer: No Typology Code available for payment source | Admitting: Family Medicine

## 2022-10-17 ENCOUNTER — Encounter: Payer: Self-pay | Admitting: Family Medicine

## 2022-10-17 ENCOUNTER — Ambulatory Visit (INDEPENDENT_AMBULATORY_CARE_PROVIDER_SITE_OTHER): Payer: No Typology Code available for payment source | Admitting: Family Medicine

## 2022-10-17 ENCOUNTER — Other Ambulatory Visit: Payer: Self-pay

## 2022-10-17 VITALS — BP 116/72 | HR 81 | Resp 16 | Ht 70.0 in | Wt 227.0 lb

## 2022-10-17 DIAGNOSIS — E039 Hypothyroidism, unspecified: Secondary | ICD-10-CM

## 2022-10-17 DIAGNOSIS — E538 Deficiency of other specified B group vitamins: Secondary | ICD-10-CM

## 2022-10-17 DIAGNOSIS — E559 Vitamin D deficiency, unspecified: Secondary | ICD-10-CM

## 2022-10-17 DIAGNOSIS — E669 Obesity, unspecified: Secondary | ICD-10-CM | POA: Diagnosis not present

## 2022-10-17 DIAGNOSIS — M1A0710 Idiopathic chronic gout, right ankle and foot, without tophus (tophi): Secondary | ICD-10-CM

## 2022-10-17 MED ORDER — QSYMIA 7.5-46 MG PO CP24
1.0000 | ORAL_CAPSULE | Freq: Every day | ORAL | 0 refills | Status: DC
  Filled 2022-10-17: qty 90, 90d supply, fill #0

## 2022-10-17 MED ORDER — QSYMIA 3.75-23 MG PO CP24
1.0000 | ORAL_CAPSULE | Freq: Every morning | ORAL | 0 refills | Status: DC
  Filled 2022-10-17 – 2022-10-28 (×3): qty 30, 30d supply, fill #0
  Filled 2022-10-31: qty 14, 14d supply, fill #0

## 2022-10-17 NOTE — Progress Notes (Deleted)
Name: Oscar Allen   MRN: [id number]    DOB: Oct 03, 1986   Date:10/17/2022       Progress Note  Subjective  Chief Complaint  Qsymia Request  HPI  *** Patient Active Problem List   Diagnosis Date Noted   Family history of rheumatoid arthritis 07/28/2018   Hyperuricemia 07/28/2018   Vitreous floater, bilateral 05/21/2018   Dry eye syndrome of both lacrimal glands 05/21/2018   Peroneal tendinitis 02/12/2018   Overweight (BMI 25.0-29.9) 09/18/2017   Bone spur of left ankle 03/28/2016   Insomnia, persistent 08/26/2015   History of hypertension 08/26/2015   Adult hypothyroidism 08/26/2015   B12 deficiency 08/26/2015   Gout of big toe 08/26/2015   Vitamin D deficiency 08/26/2015    Past Surgical History:  Procedure Laterality Date   EYE SURGERY Bilateral 2012   [facility]   FOOT SURGERY Right 2000   LASIK Bilateral 2012   [facility]   MANDIBLE FRACTURE SURGERY  2001   RETINAL DETACHMENT SURGERY Right 2012   Laser for repair of non-traumatic RD   RHINOPLASTY  2013    Family History  Problem Relation Age of Onset   Anxiety disorder Mother    Rheum arthritis Mother    Hypothyroidism Father    Heart Problems Brother    Psoriasis Brother    Rheum arthritis Maternal Grandmother    Pancreatic cancer Maternal Grandfather    Liver cancer Paternal Grandfather    Glaucoma Paternal Grandfather     Social History   Tobacco Use   Smoking status: Never   Smokeless tobacco: Never  Substance Use Topics   Alcohol use: Yes    Alcohol/week: 0.0 standard drinks of alcohol    Comment: OCCASIONALLY     Current Outpatient Medications:    Acetaminophen 325 MG CAPS, Take 2 capsules by mouth as needed., Disp: , Rfl:    allopurinol (ZYLOPRIM) 100 MG tablet, 2 and a half  tablets Orally Once a day 90 days, Disp: 225 tablet, Rfl: 0   colchicine 0.6 MG tablet, Take 2 tablets (1.2 mg total) by mouth daily as needed (and repat in one hour if no resolution of pain)., Disp: 30  tablet, Rfl: 0   famotidine (PEPCID) 20 MG tablet, Take 1 tablet (20 mg total) by mouth 2 (two) times daily., Disp: 100 tablet, Rfl: 0   hydrOXYzine (VISTARIL) 25 MG capsule, Take 1-2 capsules (25-50 mg total) by mouth every 6 (six) hours as needed., Disp: 100 capsule, Rfl: 0   ibuprofen (ADVIL) 200 MG tablet, Take 2 tablets by mouth as needed., Disp: , Rfl:    levothyroxine (SYNTHROID) 75 MCG tablet, TAKE 1 TABLET BY MOUTH ONCE A DAY, Disp: 90 tablet, Rfl: 1   terbinafine (LAMISIL) 250 MG tablet, Take 1 tablet (250 mg total) by mouth daily., Disp: 14 tablet, Rfl: 0   triamcinolone cream (KENALOG) 0.1 %, Apply 1 Application topically 2 (two) times daily., Disp: 454 g, Rfl: 0  Allergies  Allergen Reactions   Rhus Coriaria [Sumac] Hives    I personally reviewed active problem list, medication list, allergies, family history, social history, health maintenance with the patient/caregiver today.   ROS  ***  Objective  There were no vitals filed for this visit.  There is no height or weight on file to calculate BMI.  Physical Exam ***  Recent Results (from the past 2160 hour(s))  CBC with Differential/Platelet     Status: None   Collection Time: 08/28/22  8:34 AM  Result Value Ref Range   WBC 5.2 3.8 - 10.8 Thousand/uL   RBC 5.09 4.20 - 5.80 Million/uL   Hemoglobin 14.8 13.2 - 17.1 g/dL   HCT 44.3 38.5 - 50.0 %   MCV 87.0 80.0 - 100.0 fL   MCH 29.1 27.0 - 33.0 pg   MCHC 33.4 32.0 - 36.0 g/dL   RDW 13.3 11.0 - 15.0 %   Platelets 242 140 - 400 Thousand/uL   MPV 9.9 7.5 - 12.5 fL   Neutro Abs 2,777 1,500 - 7,800 cells/uL   Lymphs Abs 1,737 850 - 3,900 cells/uL   Absolute Monocytes 328 200 - 950 cells/uL   Eosinophils Absolute 317 15 - 500 cells/uL   Basophils Absolute 42 0 - 200 cells/uL   Neutrophils Relative % 53.4 %   Total Lymphocyte 33.4 %   Monocytes Relative 6.3 %   Eosinophils Relative 6.1 %   Basophils Relative 0.8 %  COMPLETE METABOLIC PANEL WITH GFR     Status:  Abnormal   Collection Time: 08/28/22  8:34 AM  Result Value Ref Range   Glucose, Bld 87 65 - 99 mg/dL    Comment: .            Fasting reference interval .    BUN 12 7 - 25 mg/dL   Creat 0.91 0.60 - 1.26 mg/dL   eGFR 111 > OR = 60 mL/min/1.26m   BUN/Creatinine Ratio SEE NOTE: 6 - 22 (calc)    Comment:    Not Reported: BUN and Creatinine are within    reference range. .    Sodium 142 135 - 146 mmol/L   Potassium 4.0 3.5 - 5.3 mmol/L   Chloride 105 98 - 110 mmol/L   CO2 29 20 - 32 mmol/L   Calcium 9.3 8.6 - 10.3 mg/dL   Total Protein 6.6 6.1 - 8.1 g/dL   Albumin 4.6 3.6 - 5.1 g/dL   Globulin 2.0 1.9 - 3.7 g/dL (calc)   AG Ratio 2.3 1.0 - 2.5 (calc)   Total Bilirubin 0.7 0.2 - 1.2 mg/dL   Alkaline phosphatase (APISO) 35 (L) 36 - 130 U/L   AST 18 10 - 40 U/L   ALT 32 9 - 46 U/L  Celiac Disease Panel     Status: None   Collection Time: 08/28/22  8:34 AM  Result Value Ref Range   Immunoglobulin A 147 47 - 310 mg/dL   Gliadin IgA <1.0 U/mL    Comment: Value          Interpretation -----          -------------- <15.0          Antibody not detected > or = 15.0    Antibody detected .    Gliadin IgG <1.0 U/mL    Comment: Value          Interpretation -----          -------------- <15.0          Antibody not detected > or = 15.0    Antibody detected .    (tTG) Ab, IgG <1.0 U/mL    Comment: Value          Interpretation -----          -------------- <15.0          Antibody not detected > or = 15.0    Antibody detected .    (tTG) Ab, IgA <1.0 U/mL    Comment: Value  Interpretation -----          -------------- <15.0          Antibody not detected > or = 15.0    Antibody detected .   TSH     Status: None   Collection Time: 08/28/22  8:34 AM  Result Value Ref Range   TSH 1.87 0.40 - 4.50 mIU/L  Sed Rate (ESR)     Status: None   Collection Time: 08/28/22  8:34 AM  Result Value Ref Range   Sed Rate 2 0 - 15 mm/h  C-reactive protein     Status: None    Collection Time: 08/28/22  8:34 AM  Result Value Ref Range   CRP 1.7 <8.0 mg/L    PHQ2/9:    08/28/2022    8:00 AM 08/12/2022    3:43 PM 08/12/2022    3:42 PM 12/20/2021   11:19 AM 05/17/2021    1:34 PM  Depression screen PHQ 2/9  Decreased Interest 0 0 0 0 0  Down, Depressed, Hopeless 2 0 0 0 0  PHQ - 2 Score 2 0 0 0 0  Altered sleeping 3 0 0 0   Tired, decreased energy 3 0 0 3   Change in appetite 0 0 0 1   Feeling bad or failure about yourself  0 0 0 0   Trouble concentrating 1 0 0 0   Moving slowly or fidgety/restless 0 0 0 0   Suicidal thoughts 0 0 0 0   PHQ-9 Score 9 0 0 4   Difficult doing work/chores  Not difficult at all Not difficult at all      phq 9 is {gen pos [id number]}   Fall Risk:    08/28/2022    8:00 AM 08/12/2022    3:39 PM 12/20/2021   11:18 AM 05/17/2021    1:34 PM 11/16/2020    1:45 PM  Fall Risk   Falls in the past year? 0 0 0 0 0  Number falls in past yr: 0 0 0 0 0  Injury with Fall? 0 0 0 0 0  Risk for fall due to : No Fall Risks  No Fall Risks    Follow up Falls prevention discussed  Falls prevention discussed        Functional Status Survey:      Assessment & Plan  *** There are no diagnoses linked to this encounter.

## 2022-10-17 NOTE — Progress Notes (Signed)
Name: Oscar Allen   MRN: [id number]    DOB: 05-Nov-1986   Date:10/17/2022       Progress Note  Subjective  Chief Complaint  Medication follow up   HPI  Hypothyroidism: last TSH was at goal, he is very compliant with medication. Continue current dose . No change in bowel movements, no dysphagia.   Snoring/interrupted sleep: he had a high ESS - 14- in our office, also states father and younger brother have sleep apnea. He had a sleep study and it was negative. Still not snoring loudly , he has been gaining weight but snoring is the same    Obesity: he tried Contrave, but did not noticed any change in his appetite, also took Belviq for over 6 weeks without any weight loss. He responded well to Qsymia in the past . He started in 11/2016 , initial weight of 251 lbs and he went as low as 198 lbs,  he stopped taking medication on his own  a couple of years ago and was able to maintain the weight around 200, however over the past year he noticed difficulty controlling his cravings and is gradually gaining the weight back. They bought a house and despite being more active, following Weight [employer] online, doing intermittent fasting and still going to the gym his weight continues to go up. He is frustrated and would like to try Qsymia once more. We also discussed Mancel Parsons - he denies personal history of pancreatitis or family history of thyroid cancer. We will try PA for Qsymia if needed    Insomnia:sleep varies, sometimes sleeps well and sometimes wakes up multiple times at night    Vitamin D : he needs to resume supplementation    Vitamin B12: he needs to resume SL spray    Gout: under the care of Rheumatologist, taking 200 mg daily , he had a flare recently, took colchicine and it improve symptoms    Patient Active Problem List   Diagnosis Date Noted   Family history of rheumatoid arthritis 07/28/2018   Hyperuricemia 07/28/2018   Vitreous floater, bilateral 05/21/2018   Dry eye  syndrome of both lacrimal glands 05/21/2018   Peroneal tendinitis 02/12/2018   Overweight (BMI 25.0-29.9) 09/18/2017   Bone spur of left ankle 03/28/2016   Insomnia, persistent 08/26/2015   History of hypertension 08/26/2015   Adult hypothyroidism 08/26/2015   B12 deficiency 08/26/2015   Gout of big toe 08/26/2015   Vitamin D deficiency 08/26/2015    Past Surgical History:  Procedure Laterality Date   EYE SURGERY Bilateral 2012   [facility]   FOOT SURGERY Right 2000   LASIK Bilateral 2012   [facility]   MANDIBLE FRACTURE SURGERY  2001   RETINAL DETACHMENT SURGERY Right 2012   Laser for repair of non-traumatic RD   RHINOPLASTY  2013    Family History  Problem Relation Age of Onset   Anxiety disorder Mother    Rheum arthritis Mother    Hypothyroidism Father    Heart Problems Brother    Psoriasis Brother    Rheum arthritis Maternal Grandmother    Pancreatic cancer Maternal Grandfather    Liver cancer Paternal Grandfather    Glaucoma Paternal Grandfather     Social History   Tobacco Use   Smoking status: Never   Smokeless tobacco: Never  Substance Use Topics   Alcohol use: Yes    Alcohol/week: 0.0 standard drinks of alcohol    Comment: OCCASIONALLY     Current Outpatient  Medications:    Acetaminophen 325 MG CAPS, Take 2 capsules by mouth as needed., Disp: , Rfl:    allopurinol (ZYLOPRIM) 100 MG tablet, 2 and a half  tablets Orally Once a day 90 days, Disp: 225 tablet, Rfl: 0   colchicine 0.6 MG tablet, Take 2 tablets (1.2 mg total) by mouth daily as needed (and repat in one hour if no resolution of pain)., Disp: 30 tablet, Rfl: 0   famotidine (PEPCID) 20 MG tablet, Take 1 tablet (20 mg total) by mouth 2 (two) times daily., Disp: 100 tablet, Rfl: 0   hydrOXYzine (VISTARIL) 25 MG capsule, Take 1-2 capsules (25-50 mg total) by mouth every 6 (six) hours as needed., Disp: 100 capsule, Rfl: 0   ibuprofen (ADVIL) 200 MG tablet, Take 2 tablets by mouth as needed., Disp:  , Rfl:    levothyroxine (SYNTHROID) 75 MCG tablet, TAKE 1 TABLET BY MOUTH ONCE A DAY, Disp: 90 tablet, Rfl: 1   triamcinolone cream (KENALOG) 0.1 %, Apply 1 Application topically 2 (two) times daily., Disp: 454 g, Rfl: 0  Allergies  Allergen Reactions   Rhus Coriaria [Sumac] Hives    I personally reviewed active problem list, medication list, allergies, family history, social history with the patient/caregiver today.   ROS  Constitutional: Negative for fever , positive  weight change.  Respiratory: Negative for cough and shortness of breath.   Cardiovascular: Negative for chest pain or palpitations.  Gastrointestinal: Negative for abdominal pain, no bowel changes.  Musculoskeletal: Negative for gait problem or joint swelling.  Skin: rash improved with anti-fungal medication  Neurological: Negative for dizziness or headache.  No other specific complaints in a complete review of systems (except as listed in HPI above).   Objective  Vitals:   10/17/22 1139  BP: 116/72  Pulse: 81  Resp: 16  SpO2: 98%  Weight: 227 lb (103 kg)  Height: $Remove'5\' 10"'IMtkopE$  (1.778 m)    Body mass index is 32.57 kg/m.  Physical Exam  Constitutional: Patient appears well-developed and well-nourished. Obese  No distress.  HEENT: head atraumatic, normocephalic, pupils equal and reactive to light, neck supple Cardiovascular: Normal rate, regular rhythm and normal heart sounds.  No murmur heard. No BLE edema. Pulmonary/Chest: Effort normal and breath sounds normal. No respiratory distress. Abdominal: Soft.  There is no tenderness. Psychiatric: Patient has a normal mood and affect. behavior is normal. Judgment and thought content normal.   Recent Results (from the past 2160 hour(s))  CBC with Differential/Platelet     Status: None   Collection Time: 08/28/22  8:34 AM  Result Value Ref Range   WBC 5.2 3.8 - 10.8 Thousand/uL   RBC 5.09 4.20 - 5.80 Million/uL   Hemoglobin 14.8 13.2 - 17.1 g/dL   HCT 44.3 38.5  - 50.0 %   MCV 87.0 80.0 - 100.0 fL   MCH 29.1 27.0 - 33.0 pg   MCHC 33.4 32.0 - 36.0 g/dL   RDW 13.3 11.0 - 15.0 %   Platelets 242 140 - 400 Thousand/uL   MPV 9.9 7.5 - 12.5 fL   Neutro Abs 2,777 1,500 - 7,800 cells/uL   Lymphs Abs 1,737 850 - 3,900 cells/uL   Absolute Monocytes 328 200 - 950 cells/uL   Eosinophils Absolute 317 15 - 500 cells/uL   Basophils Absolute 42 0 - 200 cells/uL   Neutrophils Relative % 53.4 %   Total Lymphocyte 33.4 %   Monocytes Relative 6.3 %   Eosinophils Relative 6.1 %   Basophils Relative  0.8 %  COMPLETE METABOLIC PANEL WITH GFR     Status: Abnormal   Collection Time: 08/28/22  8:34 AM  Result Value Ref Range   Glucose, Bld 87 65 - 99 mg/dL    Comment: .            Fasting reference interval .    BUN 12 7 - 25 mg/dL   Creat 0.91 0.60 - 1.26 mg/dL   eGFR 111 > OR = 60 mL/min/1.57m   BUN/Creatinine Ratio SEE NOTE: 6 - 22 (calc)    Comment:    Not Reported: BUN and Creatinine are within    reference range. .    Sodium 142 135 - 146 mmol/L   Potassium 4.0 3.5 - 5.3 mmol/L   Chloride 105 98 - 110 mmol/L   CO2 29 20 - 32 mmol/L   Calcium 9.3 8.6 - 10.3 mg/dL   Total Protein 6.6 6.1 - 8.1 g/dL   Albumin 4.6 3.6 - 5.1 g/dL   Globulin 2.0 1.9 - 3.7 g/dL (calc)   AG Ratio 2.3 1.0 - 2.5 (calc)   Total Bilirubin 0.7 0.2 - 1.2 mg/dL   Alkaline phosphatase (APISO) 35 (L) 36 - 130 U/L   AST 18 10 - 40 U/L   ALT 32 9 - 46 U/L  Celiac Disease Panel     Status: None   Collection Time: 08/28/22  8:34 AM  Result Value Ref Range   Immunoglobulin A 147 47 - 310 mg/dL   Gliadin IgA <1.0 U/mL    Comment: Value          Interpretation -----          -------------- <15.0          Antibody not detected > or = 15.0    Antibody detected .    Gliadin IgG <1.0 U/mL    Comment: Value          Interpretation -----          -------------- <15.0          Antibody not detected > or = 15.0    Antibody detected .    (tTG) Ab, IgG <1.0 U/mL    Comment: Value           Interpretation -----          -------------- <15.0          Antibody not detected > or = 15.0    Antibody detected .    (tTG) Ab, IgA <1.0 U/mL    Comment: Value          Interpretation -----          -------------- <15.0          Antibody not detected > or = 15.0    Antibody detected .   TSH     Status: None   Collection Time: 08/28/22  8:34 AM  Result Value Ref Range   TSH 1.87 0.40 - 4.50 mIU/L  Sed Rate (ESR)     Status: None   Collection Time: 08/28/22  8:34 AM  Result Value Ref Range   Sed Rate 2 0 - 15 mm/h  C-reactive protein     Status: None   Collection Time: 08/28/22  8:34 AM  Result Value Ref Range   CRP 1.7 <8.0 mg/L     PHQ2/9:    10/17/2022   11:44 AM 08/28/2022    8:00 AM 08/12/2022    3:43 PM 08/12/2022  3:42 PM 12/20/2021   11:19 AM  Depression screen PHQ 2/9  Decreased Interest 0 0 0 0 0  Down, Depressed, Hopeless 0 2 0 0 0  PHQ - 2 Score 0 2 0 0 0  Altered sleeping 3 3 0 0 0  Tired, decreased energy 0 3 0 0 3  Change in appetite 3 0 0 0 1  Feeling bad or failure about yourself  0 0 0 0 0  Trouble concentrating 0 1 0 0 0  Moving slowly or fidgety/restless 0 0 0 0 0  Suicidal thoughts 0 0 0 0 0  PHQ-9 Score 6 9 0 0 4  Difficult doing work/chores   Not difficult at all Not difficult at all     phq 9 is negative   Fall Risk:    10/17/2022   11:38 AM 08/28/2022    8:00 AM 08/12/2022    3:39 PM 12/20/2021   11:18 AM 05/17/2021    1:34 PM  Fall Risk   Falls in the past year? 0 0 0 0 0  Number falls in past yr: 0 0 0 0 0  Injury with Fall? 0 0 0 0 0  Risk for fall due to : No Fall Risks No Fall Risks  No Fall Risks   Follow up Falls prevention discussed Falls prevention discussed  Falls prevention discussed      Functional Status Survey: Is the patient deaf or have difficulty hearing?: No Does the patient have difficulty seeing, even when wearing glasses/contacts?: No Does the patient have difficulty concentrating, remembering,  or making decisions?: No Does the patient have difficulty walking or climbing stairs?: No Does the patient have difficulty dressing or bathing?: No Does the patient have difficulty doing errands alone such as visiting a doctor's office or shopping?: No    Assessment & Plan  1. Obesity (BMI 30.0-34.9)  BMI above 30, patient has a long history of obesity, responded well to medication in the past, physically active and following a healthy diet but gradually gaining weight again. We will try PA for Qsymia again   - Phentermine-Topiramate (QSYMIA) 3.75-23 MG CP24; Take 1 capsule by mouth in the morning.  Dispense: 30 capsule; Refill: 0 - Phentermine-Topiramate (QSYMIA) 7.5-46 MG CP24; Take 1 capsule by mouth daily at 12 noon.  Dispense: 90 capsule; Refill: 0  2. Vitamin D deficiency  Continue supplementation   3. Chronic gout of right ankle, unspecified cause  Sees rheumatologist , recent flare  4. Adult hypothyroidism  Last TSH at goal   5. B12 deficiency  Continue supplementation

## 2022-10-24 ENCOUNTER — Other Ambulatory Visit: Payer: Self-pay

## 2022-10-24 ENCOUNTER — Telehealth: Payer: Self-pay | Admitting: Family Medicine

## 2022-10-24 NOTE — Telephone Encounter (Signed)
Patient called in checking status of PA for, Qsymia. Please call back

## 2022-10-28 ENCOUNTER — Other Ambulatory Visit: Payer: Self-pay

## 2022-10-31 ENCOUNTER — Other Ambulatory Visit: Payer: Self-pay

## 2022-10-31 NOTE — Telephone Encounter (Unsigned)
Copied from [place] [id number]. Topic: General - Other >> Oct 31, 2022  1:27 PM Everette C wrote: Reason for CRM: The patient has called to request completion of a prior authorization for their previously discussed wegovy prescription   Please contact the patient further when possible regarding this matter

## 2022-11-03 NOTE — Telephone Encounter (Signed)
PA denied.

## 2022-11-04 ENCOUNTER — Other Ambulatory Visit: Payer: Self-pay | Admitting: Family Medicine

## 2022-11-04 ENCOUNTER — Other Ambulatory Visit: Payer: Self-pay

## 2022-11-04 MED ORDER — SEMAGLUTIDE-WEIGHT MANAGEMENT 0.5 MG/0.5ML ~~LOC~~ SOAJ
0.5000 mg | SUBCUTANEOUS | 0 refills | Status: DC
  Filled 2022-11-04: qty 2, 28d supply, fill #0

## 2022-11-04 MED ORDER — SEMAGLUTIDE-WEIGHT MANAGEMENT 1.7 MG/0.75ML ~~LOC~~ SOAJ
1.7000 mg | SUBCUTANEOUS | 0 refills | Status: DC
  Filled 2022-11-04: qty 3, 28d supply, fill #0

## 2022-11-04 MED ORDER — SEMAGLUTIDE-WEIGHT MANAGEMENT 2.4 MG/0.75ML ~~LOC~~ SOAJ
2.4000 mg | SUBCUTANEOUS | 0 refills | Status: DC
  Filled 2022-11-04: qty 3, 28d supply, fill #0

## 2022-11-04 MED ORDER — SEMAGLUTIDE-WEIGHT MANAGEMENT 0.25 MG/0.5ML ~~LOC~~ SOAJ
0.2500 mg | SUBCUTANEOUS | 0 refills | Status: AC
  Filled 2022-11-04 – 2022-11-07 (×2): qty 2, 28d supply, fill #0

## 2022-11-04 MED ORDER — SEMAGLUTIDE-WEIGHT MANAGEMENT 1 MG/0.5ML ~~LOC~~ SOAJ
1.0000 mg | SUBCUTANEOUS | 0 refills | Status: DC
  Filled 2022-11-04: qty 2, 28d supply, fill #0

## 2022-11-07 ENCOUNTER — Other Ambulatory Visit: Payer: Self-pay

## 2022-11-10 ENCOUNTER — Other Ambulatory Visit: Payer: Self-pay

## 2022-11-28 ENCOUNTER — Other Ambulatory Visit: Payer: Self-pay

## 2022-12-25 NOTE — Patient Instructions (Addendum)
Check after January 1st if Zepbound in covered under our plan, you should also check if Qsymia will be on formulary for 2024  Ask insurance if you can get an EKG for dyslipidemia and bradycardia or if 100 % covered during your physical   Preventive Care 74-37 Years Old, Male Preventive care refers to lifestyle choices and visits with your health care provider that can promote health and wellness. Preventive care visits are also called wellness exams. What can I expect for my preventive care visit? Counseling During your preventive care visit, your health care provider may ask about your: Medical history, including: Past medical problems. Family medical history. Current health, including: Emotional well-being. Home life and relationship well-being. Sexual activity. Lifestyle, including: Alcohol, nicotine or tobacco, and drug use. Access to firearms. Diet, exercise, and sleep habits. Safety issues such as seatbelt and bike helmet use. Sunscreen use. Work and work Statistician. Physical exam Your health care provider may check your: Height and weight. These may be used to calculate your BMI (body mass index). BMI is a measurement that tells if you are at a healthy weight. Waist circumference. This measures the distance around your waistline. This measurement also tells if you are at a healthy weight and may help predict your risk of certain diseases, such as type 2 diabetes and high blood pressure. Heart rate and blood pressure. Body temperature. Skin for abnormal spots. What immunizations do I need?  Vaccines are usually given at various ages, according to a schedule. Your health care provider will recommend vaccines for you based on your age, medical history, and lifestyle or other factors, such as travel or where you work. What tests do I need? Screening Your health care provider may recommend screening tests for certain conditions. This may include: Lipid and cholesterol  levels. Diabetes screening. This is done by checking your blood sugar (glucose) after you have not eaten for a while (fasting). Hepatitis B test. Hepatitis C test. HIV (human immunodeficiency virus) test. STI (sexually transmitted infection) testing, if you are at risk. Talk with your health care provider about your test results, treatment options, and if necessary, the need for more tests. Follow these instructions at home: Eating and drinking  Eat a healthy diet that includes fresh fruits and vegetables, whole grains, lean protein, and low-fat dairy products. Drink enough fluid to keep your urine pale yellow. Take vitamin and mineral supplements as recommended by your health care provider. Do not drink alcohol if your health care provider tells you not to drink. If you drink alcohol: Limit how much you have to 0-2 drinks a day. Know how much alcohol is in your drink. In the [place] one drink equals one 12 oz bottle of beer (355 mL), one 5 oz glass of wine (148 mL), or one 1 oz glass of hard liquor (44 mL). Lifestyle Brush your teeth every morning and night with fluoride toothpaste. Floss one time each day. Exercise for at least 30 minutes 5 or more days each week. Do not use any products that contain nicotine or tobacco. These products include cigarettes, chewing tobacco, and vaping devices, such as e-cigarettes. If you need help quitting, ask your health care provider. Do not use drugs. If you are sexually active, practice safe sex. Use a condom or other form of protection to prevent STIs. Find healthy ways to manage stress, such as: Meditation, yoga, or listening to music. Journaling. Talking to a trusted person. Spending time with friends and family. Minimize exposure to UV radiation  to reduce your risk of skin cancer. Safety Always wear your seat belt while driving or riding in a vehicle. Do not drive: If you have been drinking alcohol. Do not ride with someone who has been  drinking. If you have been using any mind-altering substances or drugs. While texting. When you are tired or distracted. Wear a helmet and other protective equipment during sports activities. If you have firearms in your house, make sure you follow all gun safety procedures. Seek help if you have been physically or sexually abused. What's next? Go to your health care provider once a year for an annual wellness visit. Ask your health care provider how often you should have your eyes and teeth checked. Stay up to date on all vaccines. This information is not intended to replace advice given to you by your health care provider. Make sure you discuss any questions you have with your health care provider. Document Revised: 06/12/2021 Document Reviewed: 06/12/2021 Elsevier Patient Education  [place].

## 2022-12-25 NOTE — Progress Notes (Signed)
Name: Oscar Allen   MRN: [id number]    DOB: [birth year]   Date:12/26/2022       Progress Note  Subjective  Chief Complaint  Annual Exam  HPI  Patient presents for annual CPE.  Diet: still cooking at home, occasionally goes out or eats pizza. Eating more over the holidays and has difficulty with portion control and has gained since off weight loss medication, he has been unable to fill rx of Wegovy due to [place] shortage . He did well on Qsymia and if new plan covers we can switch to Zepbound or Qsymia  Exercise: continue regular activity  Last Dental Exam: up to date  Last Eye Exam: within the last 2 years   IPSS Questionnaire (AUA-7): Over the past month.   1)  How often have you had a sensation of not emptying your bladder completely after you finish urinating?  0 - Not at all  2)  How often have you had to urinate again less than two hours after you finished urinating? 0 - Not at all  3)  How often have you found you stopped and started again several times when you urinated?  1 - Less than 1 time in 5  4) How difficult have you found it to postpone urination?  0 - Not at all  5) How often have you had a weak urinary stream?  1 - Less than 1 time in 5  6) How often have you had to push or strain to begin urination?  0 - Not at all  7) How many times did you most typically get up to urinate from the time you went to bed until the time you got up in the morning?  1 - 1 time  Total score:  0-7 mildly symptomatic   8-19 moderately symptomatic   20-35 severely symptomatic     Depression: phq 9 is negative    12/26/2022    2:02 PM 10/17/2022   11:44 AM 08/28/2022    8:00 AM 08/12/2022    3:43 PM 08/12/2022    3:42 PM  Depression screen PHQ 2/9  Decreased Interest 0 0 0 0 0  Down, Depressed, Hopeless 0 0 2 0 0  PHQ - 2 Score 0 0 2 0 0  Altered sleeping 0 3 3 0 0  Tired, decreased energy 0 0 3 0 0  Change in appetite 0 3 0 0 0  Feeling bad or failure about yourself   0 0 0 0 0  Trouble concentrating 0 0 1 0 0  Moving slowly or fidgety/restless 0 0 0 0 0  Suicidal thoughts 0 0 0 0 0  PHQ-9 Score 0 6 9 0 0  Difficult doing work/chores    Not difficult at all Not difficult at all    Hypertension:  BP Readings from Last 3 Encounters:  12/26/22 118/70  10/17/22 116/72  08/28/22 132/72    Obesity: Wt Readings from Last 3 Encounters:  12/26/22 236 lb 1.6 oz (107.1 kg)  10/17/22 227 lb (103 kg)  08/28/22 220 lb (99.8 kg)   BMI Readings from Last 3 Encounters:  12/26/22 33.88 kg/m  10/17/22 32.57 kg/m  08/28/22 31.57 kg/m     Lipids:  Lab Results  Component Value Date   CHOL 211 (H) 12/20/2021   CHOL 209 (H) 11/16/2020   CHOL 227 (H) 10/07/2019   Lab Results  Component Value Date   HDL 53 12/20/2021   HDL 51 11/16/2020  HDL 54 10/07/2019   Lab Results  Component Value Date   LDLCALC 138 (H) 12/20/2021   LDLCALC 138 (H) 11/16/2020   LDLCALC 145 (H) 10/07/2019   Lab Results  Component Value Date   TRIG 95 12/20/2021   TRIG 94 11/16/2020   TRIG 146 10/07/2019   Lab Results  Component Value Date   CHOLHDL 4.0 12/20/2021   CHOLHDL 4.1 11/16/2020   CHOLHDL 4.2 10/07/2019   No results found for: "LDLDIRECT" Glucose:  Glucose, Bld  Date Value Ref Range Status  08/28/2022 87 65 - 99 mg/dL Final    Comment:    .            Fasting reference interval .   12/20/2021 77 65 - 99 mg/dL Final    Comment:    .            Fasting reference interval .   11/16/2020 79 65 - 99 mg/dL Final    Comment:    .            Fasting reference interval .     [place] Office Visit from 12/26/2022 in [facility]  AUDIT-C Score 1       Married STD testing and prevention (HIV/chl/gon/syphilis): 08/31/15 Sexual history: one partner  Hep C Screening: 10/07/19 Skin cancer: Discussed monitoring for atypical lesions Colorectal cancer: N/A Prostate cancer:  no family history, discussed USPTF    Lung cancer:   Low Dose CT Chest recommended if Age 55-80 years, 30 pack-year currently smoking OR have quit w/in 15years. Patient  no a candidate for screening   AAA: The USPSTF recommends one-time screening with ultrasonography in men ages 42 to 84 years who have ever smoked. Patient   no, a candidate for screening  ECG:  N/A  Vaccines:   Tdap: up to date Shingrix: N/A Pneumonia: N/A Flu: up to date COVID-19: discussed booster   Advanced Care Planning: A voluntary discussion about advance care planning including the explanation and discussion of advance directives.  Discussed health care proxy and Living will, and the patient was able to identify a health care proxy as wife .  Patient does not have a living will and power of attorney of health care   Patient Active Problem List   Diagnosis Date Noted   Family history of rheumatoid arthritis 07/28/2018   Hyperuricemia 07/28/2018   Vitreous floater, bilateral 05/21/2018   Dry eye syndrome of both lacrimal glands 05/21/2018   Peroneal tendinitis 02/12/2018   Overweight (BMI 25.0-29.9) 09/18/2017   Bone spur of left ankle 03/28/2016   Insomnia, persistent 08/26/2015   History of hypertension 08/26/2015   Adult hypothyroidism 08/26/2015   B12 deficiency 08/26/2015   Gout of big toe 08/26/2015   Vitamin D deficiency 08/26/2015    Past Surgical History:  Procedure Laterality Date   EYE SURGERY Bilateral 2012   [facility]   FOOT SURGERY Right 2000   LASIK Bilateral 2012   [place] [facility]   MANDIBLE FRACTURE SURGERY  2001   RETINAL DETACHMENT SURGERY Right 2012   Laser for repair of non-traumatic RD   RHINOPLASTY  2013   VASECTOMY      Family History  Problem Relation Age of Onset   Anxiety disorder Mother    Rheum arthritis Mother    Hypothyroidism Father    Heart Problems Brother    Psoriasis Brother    Rheum arthritis Maternal Grandmother    Pancreatic cancer Maternal Grandfather  Liver cancer Paternal Grandfather    Glaucoma  Paternal Grandfather     Social History   Socioeconomic History   Marital status: Married    Spouse name: Megan   Number of children: 0   Years of education: Not on file   Highest education level: Doctorate  Occupational History   Occupation: PT  Tobacco Use   Smoking status: Never   Smokeless tobacco: Never  Vaping Use   Vaping Use: Never used  Substance and Sexual Activity   Alcohol use: Yes    Alcohol/week: 0.0 standard drinks of alcohol    Comment: OCCASIONALLY   Drug use: No   Sexual activity: Yes    Partners: Female  Other Topics Concern   Not on file  Social History Narrative   Not on file   Social Determinants of Health   Financial Resource Strain: Low Risk  (12/26/2022)   Overall Financial Resource Strain (CARDIA)    Difficulty of Paying Living Expenses: Not hard at all  Food Insecurity: No Food Insecurity (12/26/2022)   Hunger Vital Sign    Worried About Running Out of Food in the Last Year: Never true    [place] in the Last Year: Never true  Transportation Needs: No Transportation Needs (12/26/2022)   PRAPARE - Hydrologist (Medical): No    Lack of Transportation (Non-Medical): No  Physical Activity: Sufficiently Active (12/26/2022)   Exercise Vital Sign    Days of Exercise per Week: 4 days    Minutes of Exercise per Session: 50 min  Stress: Stress Concern Present (12/26/2022)   [place]    Feeling of Stress : To some extent  Social Connections: Moderately Integrated (12/26/2022)   Social Connection and Isolation Panel [NHANES]    Frequency of Communication with Friends and Family: Three times a week    Frequency of Social Gatherings with Friends and Family: Once a week    Attends Religious Services: More than 4 times per year    Active Member of [employer] or Organizations: No    Attends Archivist Meetings: Never    Marital Status: Married   Human resources officer Violence: Not At Risk (12/26/2022)   Humiliation, Afraid, Rape, and Kick questionnaire    Fear of Current or Ex-Partner: No    Emotionally Abused: No    Physically Abused: No    Sexually Abused: No     Current Outpatient Medications:    Acetaminophen 325 MG CAPS, Take 2 capsules by mouth as needed., Disp: , Rfl:    allopurinol (ZYLOPRIM) 100 MG tablet, 2 and a half  tablets Orally Once a day 90 days, Disp: 225 tablet, Rfl: 0   colchicine 0.6 MG tablet, Take 2 tablets (1.2 mg total) by mouth daily as needed (and repat in one hour if no resolution of pain)., Disp: 30 tablet, Rfl: 0   famotidine (PEPCID) 20 MG tablet, Take 1 tablet (20 mg total) by mouth 2 (two) times daily., Disp: 100 tablet, Rfl: 0   hydrOXYzine (VISTARIL) 25 MG capsule, Take 1-2 capsules (25-50 mg total) by mouth every 6 (six) hours as needed., Disp: 100 capsule, Rfl: 0   ibuprofen (ADVIL) 200 MG tablet, Take 2 tablets by mouth as needed., Disp: , Rfl:    levothyroxine (SYNTHROID) 75 MCG tablet, TAKE 1 TABLET BY MOUTH ONCE A DAY, Disp: 90 tablet, Rfl: 1   Semaglutide-Weight Management 0.25 MG/0.5ML SOAJ, Inject 0.25 mg  into the skin once a week for 28 days., Disp: 2 mL, Rfl: 0   triamcinolone cream (KENALOG) 0.1 %, Apply 1 Application topically 2 (two) times daily., Disp: 454 g, Rfl: 0   Semaglutide-Weight Management 0.5 MG/0.5ML SOAJ, Inject 0.5 mg into the skin once a week for 28 days. (Patient not taking: Reported on 12/26/2022), Disp: 2 mL, Rfl: 0   [START ON 01/01/2023] Semaglutide-Weight Management 1 MG/0.5ML SOAJ, Inject 1 mg into the skin once a week for 28 days. (Patient not taking: Reported on 12/26/2022), Disp: 2 mL, Rfl: 0   [START ON 01/30/2023] Semaglutide-Weight Management 1.7 MG/0.75ML SOAJ, Inject 1.7 mg into the skin once a week for 28 days. (Patient not taking: Reported on 12/26/2022), Disp: 3 mL, Rfl: 0   [START ON 02/28/2023] Semaglutide-Weight Management 2.4 MG/0.75ML SOAJ, Inject 2.4 mg into  the skin once a week for 28 days. (Patient not taking: Reported on 12/26/2022), Disp: 3 mL, Rfl: 0  Allergies  Allergen Reactions   Rhus Coriaria [[place]] Hives     ROS  Constitutional: Negative for fever , positive for  weight change.  Respiratory: Negative for cough and shortness of breath.   Cardiovascular: Negative for chest pain or palpitations.  Gastrointestinal: Negative for abdominal pain, no bowel changes.  Musculoskeletal: Negative for gait problem or joint swelling.  Skin: Negative for rash.  Neurological: Negative for dizziness or headache.  No other specific complaints in a complete review of systems (except as listed in HPI above).    Objective  Vitals:   12/26/22 1400  BP: 118/70  Pulse: (!) 58  Resp: 16  Temp: 97.9 F (36.6 C)  TempSrc: Oral  SpO2: 98%  Weight: 236 lb 1.6 oz (107.1 kg)  Height: '5\' 10"'$  (1.778 m)    Body mass index is 33.88 kg/m.  Physical Exam  Constitutional: Patient appears well-developed and well-nourished. No distress.  HENT: Head: Normocephalic and atraumatic. Ears: B TMs ok, no erythema or effusion; Nose: Nose normal. Mouth/Throat: Oropharynx is clear and moist. No oropharyngeal exudate.  Eyes: Conjunctivae and EOM are normal. Pupils are equal, round, and reactive to light. No scleral icterus.  Neck: Normal range of motion. Neck supple. No JVD present. No thyromegaly present.  Cardiovascular: Normal rate, regular rhythm and normal heart sounds.  No murmur heard. No BLE edema. Pulmonary/Chest: Effort normal and breath sounds normal. No respiratory distress. Abdominal: Soft. Bowel sounds are normal, no distension. There is no tenderness. no masses MALE GENITALIA: Normal descended testes bilaterally, no masses palpated, no hernias, no lesions, no discharge RECTAL: not done  Musculoskeletal: Normal range of motion, no joint effusions. No gross deformities Neurological: he is alert and oriented to person, place, and time. No cranial  nerve deficit. Coordination, balance, strength, speech and gait are normal.  Skin: Skin is warm and dry. No rash noted. No erythema.  Psychiatric: Patient has a normal mood and affect. behavior is normal. Judgment and thought content normal.    Fall Risk:    12/26/2022    2:03 PM 10/17/2022   11:38 AM 08/28/2022    8:00 AM 08/12/2022    3:39 PM 12/20/2021   11:18 AM  Fall Risk   Falls in the past year? 0 0 0 0 0  Number falls in past yr:  0 0 0 0  Injury with Fall?  0 0 0 0  Risk for fall due to : No Fall Risks No Fall Risks No Fall Risks  No Fall Risks  Follow up Falls  prevention discussed;Education provided;Falls evaluation completed Falls prevention discussed Falls prevention discussed  Falls prevention discussed     Functional Status Survey: Is the patient deaf or have difficulty hearing?: No Does the patient have difficulty seeing, even when wearing glasses/contacts?: No Does the patient have difficulty concentrating, remembering, or making decisions?: No Does the patient have difficulty walking or climbing stairs?: No Does the patient have difficulty dressing or bathing?: No Does the patient have difficulty doing errands alone such as visiting a doctor's office or shopping?: No    Assessment & Plan  1. Well adult exam  - Lipid panel - COMPLETE METABOLIC PANEL WITH GFR - CBC with Differential/Platelet - Hemoglobin A1c - VITAMIN D 25 Hydroxy (Vit-D Deficiency, Fractures) - Vitamin B12 - TSH - EKG  2. B12 deficiency  - Vitamin B12  3. Vitamin D deficiency  - VITAMIN D 25 Hydroxy (Vit-D Deficiency, Fractures)  4. Adult hypothyroidism  - TSH  5. Chronic gout of right ankle, unspecified cause   6. Dyslipidemia  - Lipid panel - EKG : sinus bradycardia   7. Diabetes mellitus screening  - Hemoglobin A1c  8. Long-term use of high-risk medication  - COMPLETE METABOLIC PANEL WITH GFR - CBC with Differential/Platelet    9. Bradycardia  - EKG 12-Lead   -Prostate cancer screening and PSA options (with potential risks and benefits of testing vs not testing) were discussed along with recent recs/guidelines. -USPSTF grade A and B recommendations reviewed with patient; age-appropriate recommendations, preventive care, screening tests, etc discussed and encouraged; healthy living encouraged; see AVS for patient education given to patient -Discussed importance of 150 minutes of physical activity weekly, eat two servings of fish weekly, eat one serving of tree nuts ( cashews, pistachios, pecans, almonds.Marland Kitchen) every other day, eat 6 servings of fruit/vegetables daily and drink plenty of water and avoid sweet beverages.  -Reviewed Health Maintenance: yes

## 2022-12-26 ENCOUNTER — Encounter: Payer: Self-pay | Admitting: Family Medicine

## 2022-12-26 ENCOUNTER — Ambulatory Visit (INDEPENDENT_AMBULATORY_CARE_PROVIDER_SITE_OTHER): Payer: No Typology Code available for payment source | Admitting: Family Medicine

## 2022-12-26 ENCOUNTER — Other Ambulatory Visit: Payer: Self-pay

## 2022-12-26 VITALS — BP 118/70 | HR 58 | Temp 97.9°F | Resp 16 | Ht 70.0 in | Wt 236.1 lb

## 2022-12-26 DIAGNOSIS — Z0000 Encounter for general adult medical examination without abnormal findings: Secondary | ICD-10-CM | POA: Diagnosis not present

## 2022-12-26 DIAGNOSIS — E785 Hyperlipidemia, unspecified: Secondary | ICD-10-CM

## 2022-12-26 DIAGNOSIS — Z131 Encounter for screening for diabetes mellitus: Secondary | ICD-10-CM

## 2022-12-26 DIAGNOSIS — R001 Bradycardia, unspecified: Secondary | ICD-10-CM

## 2022-12-26 DIAGNOSIS — E538 Deficiency of other specified B group vitamins: Secondary | ICD-10-CM | POA: Diagnosis not present

## 2022-12-26 DIAGNOSIS — Z79899 Other long term (current) drug therapy: Secondary | ICD-10-CM

## 2022-12-26 DIAGNOSIS — E559 Vitamin D deficiency, unspecified: Secondary | ICD-10-CM | POA: Diagnosis not present

## 2022-12-26 DIAGNOSIS — E039 Hypothyroidism, unspecified: Secondary | ICD-10-CM

## 2022-12-26 DIAGNOSIS — M1A0710 Idiopathic chronic gout, right ankle and foot, without tophus (tophi): Secondary | ICD-10-CM

## 2022-12-27 LAB — LIPID PANEL
Cholesterol: 234 mg/dL — ABNORMAL HIGH (ref <200)
HDL: 63 mg/dL (ref >=40)
LDL Cholesterol (Calc): 152 mg/dL (calc) — ABNORMAL HIGH
Non-HDL Cholesterol (Calc): 171 mg/dL (calc) — ABNORMAL HIGH (ref <130)
Total CHOL/HDL Ratio: 3.7 (calc) (ref <5.0)
Triglycerides: 89 mg/dL (ref <150)

## 2022-12-27 LAB — COMPLETE METABOLIC PANEL WITH GFR
AG Ratio: 2.3 (calc) (ref 1.0–2.5)
ALT: 30 U/L (ref 9–46)
AST: 19 U/L (ref 10–40)
Albumin: 5 g/dL (ref 3.6–5.1)
Alkaline phosphatase (APISO): 43 U/L (ref 36–130)
BUN: 10 mg/dL (ref 7–25)
CO2: 27 mmol/L (ref 20–32)
Calcium: 9.8 mg/dL (ref 8.6–10.3)
Chloride: 103 mmol/L (ref 98–110)
Creat: 0.86 mg/dL (ref 0.60–1.26)
Globulin: 2.2 g/dL (calc) (ref 1.9–3.7)
Glucose, Bld: 76 mg/dL (ref 65–99)
Potassium: 3.9 mmol/L (ref 3.5–5.3)
Sodium: 141 mmol/L (ref 135–146)
Total Bilirubin: 0.8 mg/dL (ref 0.2–1.2)
Total Protein: 7.2 g/dL (ref 6.1–8.1)
eGFR: 114 mL/min/{1.73_m2} (ref >=60)

## 2022-12-27 LAB — CBC WITH DIFFERENTIAL/PLATELET
Absolute Monocytes: 359 cells/uL (ref 200–950)
Basophils Absolute: 38 cells/uL (ref 0–200)
Basophils Relative: 0.6 %
Eosinophils Absolute: 107 cells/uL (ref 15–500)
Eosinophils Relative: 1.7 %
HCT: 44.5 % (ref 38.5–50.0)
Hemoglobin: 15.3 g/dL (ref 13.2–17.1)
Lymphs Abs: 2041 cells/uL (ref 850–3900)
MCH: 28.7 pg (ref 27.0–33.0)
MCHC: 34.4 g/dL (ref 32.0–36.0)
MCV: 83.5 fL (ref 80.0–100.0)
MPV: 10.1 fL (ref 7.5–12.5)
Monocytes Relative: 5.7 %
Neutro Abs: 3755 cells/uL (ref 1500–7800)
Neutrophils Relative %: 59.6 %
Platelets: 262 10*3/uL (ref 140–400)
RBC: 5.33 10*6/uL (ref 4.20–5.80)
RDW: 12.9 % (ref 11.0–15.0)
Total Lymphocyte: 32.4 %
WBC: 6.3 10*3/uL (ref 3.8–10.8)

## 2022-12-27 LAB — TSH: TSH: 1.91 mIU/L (ref 0.40–4.50)

## 2022-12-27 LAB — HEMOGLOBIN A1C
Hgb A1c MFr Bld: 5.2 % of total Hgb (ref <5.7)
Mean Plasma Glucose: 103 mg/dL
eAG (mmol/L): 5.7 mmol/L

## 2022-12-27 LAB — VITAMIN D 25 HYDROXY (VIT D DEFICIENCY, FRACTURES): Vit D, 25-Hydroxy: 20 ng/mL — ABNORMAL LOW (ref 30–100)

## 2022-12-27 LAB — VITAMIN B12: Vitamin B-12: 259 pg/mL (ref 200–1100)

## 2022-12-30 ENCOUNTER — Encounter: Payer: Self-pay | Admitting: Family Medicine

## 2022-12-30 ENCOUNTER — Other Ambulatory Visit: Payer: Self-pay | Admitting: Family Medicine

## 2022-12-30 ENCOUNTER — Other Ambulatory Visit: Payer: Self-pay

## 2022-12-30 DIAGNOSIS — E559 Vitamin D deficiency, unspecified: Secondary | ICD-10-CM

## 2022-12-30 MED ORDER — VITAMIN D (ERGOCALCIFEROL) 1.25 MG (50000 UNIT) PO CAPS
50000.0000 [IU] | ORAL_CAPSULE | ORAL | 1 refills | Status: DC
  Filled 2022-12-30: qty 12, 84d supply, fill #0
  Filled 2023-03-16: qty 12, 84d supply, fill #1

## 2022-12-30 MED ORDER — ALLOPURINOL 100 MG PO TABS
250.0000 mg | ORAL_TABLET | Freq: Every day | ORAL | 0 refills | Status: DC
  Filled 2022-12-30: qty 225, 90d supply, fill #0

## 2022-12-31 ENCOUNTER — Other Ambulatory Visit: Payer: Self-pay | Admitting: Family Medicine

## 2022-12-31 ENCOUNTER — Other Ambulatory Visit: Payer: Self-pay

## 2022-12-31 DIAGNOSIS — E669 Obesity, unspecified: Secondary | ICD-10-CM

## 2022-12-31 MED ORDER — ZEPBOUND 2.5 MG/0.5ML ~~LOC~~ SOAJ
2.5000 mg | SUBCUTANEOUS | 0 refills | Status: DC
  Filled 2022-12-31 – 2023-01-05 (×2): qty 2, 28d supply, fill #0

## 2023-01-02 ENCOUNTER — Ambulatory Visit (INDEPENDENT_AMBULATORY_CARE_PROVIDER_SITE_OTHER): Payer: 59

## 2023-01-02 ENCOUNTER — Telehealth: Payer: Self-pay

## 2023-01-02 DIAGNOSIS — E538 Deficiency of other specified B group vitamins: Secondary | ICD-10-CM

## 2023-01-02 MED ORDER — CYANOCOBALAMIN 1000 MCG/ML IJ SOLN
1000.0000 ug | Freq: Once | INTRAMUSCULAR | Status: AC
  Administered 2023-01-02: 1000 ug via INTRAMUSCULAR

## 2023-01-02 NOTE — Telephone Encounter (Signed)
PA initiated. Will update patient when determination has been made.    Copied from [place] [id number]. Topic: General - Other >> Jan 02, 2023  2:52 PM Tiffany B wrote: Reason for CRM: patient states he received confirmation from his insurance company that tirzepatide [facility]) 2.5 MG/0.5ML Pen is covered and awaiting PA from PCP

## 2023-01-05 ENCOUNTER — Other Ambulatory Visit: Payer: Self-pay

## 2023-01-05 ENCOUNTER — Other Ambulatory Visit (HOSPITAL_COMMUNITY): Payer: Self-pay

## 2023-01-06 ENCOUNTER — Other Ambulatory Visit: Payer: Self-pay

## 2023-01-23 ENCOUNTER — Other Ambulatory Visit: Payer: Self-pay

## 2023-01-27 ENCOUNTER — Other Ambulatory Visit: Payer: Self-pay

## 2023-01-27 ENCOUNTER — Other Ambulatory Visit: Payer: Self-pay | Admitting: Family Medicine

## 2023-01-27 DIAGNOSIS — E669 Obesity, unspecified: Secondary | ICD-10-CM

## 2023-01-28 ENCOUNTER — Other Ambulatory Visit: Payer: Self-pay

## 2023-01-28 MED ORDER — ZEPBOUND 2.5 MG/0.5ML ~~LOC~~ SOAJ
2.5000 mg | SUBCUTANEOUS | 0 refills | Status: DC
  Filled 2023-01-28: qty 2, 28d supply, fill #0

## 2023-01-29 ENCOUNTER — Other Ambulatory Visit: Payer: Self-pay

## 2023-01-30 ENCOUNTER — Ambulatory Visit (INDEPENDENT_AMBULATORY_CARE_PROVIDER_SITE_OTHER): Payer: 59

## 2023-01-30 DIAGNOSIS — E538 Deficiency of other specified B group vitamins: Secondary | ICD-10-CM | POA: Diagnosis not present

## 2023-01-30 MED ORDER — CYANOCOBALAMIN 1000 MCG/ML IJ SOLN
1000.0000 ug | Freq: Once | INTRAMUSCULAR | Status: AC
  Administered 2023-01-30: 1000 ug via INTRAMUSCULAR

## 2023-02-12 NOTE — Progress Notes (Signed)
Name: Oscar Allen   MRN: [id number]    DOB: [birth year]   Date:02/13/2023       Progress Note  Subjective  Chief Complaint  Follow Up  HPI  Hypothyroidism: last TSH was at goal, he is very compliant with medication. Continue current dose  He has chronic dry skin, usually bowel movements, had an episode of gastroenteritis last week but normalized now   Snoring/interrupted sleep: he had a high ESS - 14- in our office, also states father and younger brother have sleep apnea. He had a sleep study and it was negative. Still not snoring loudly , he has been gaining weight but snoring is the same    Obesity: he tried Contrave, but did not noticed any change in his appetite, also took Belviq for over 6 weeks without any weight loss. He responded well to Qsymia, started  taking it 11/2016 , initial weight of 251 lbs and he went as low as 198 lbs,  he stopped taking medication on his own  a couple of years ago and was able to maintain the weight around 200 lbs, however in 2023 he started to gain more weight and we discussed resuming medication, insurance is approving Zebpound , taking 0.25 mg dose and has lost 16 lbs in the past 5 weeks, he did get sick with gastroenteritis last week and had lost a lot of water weight but is back to baseline now. They are eating mostly a vegan diet    Insomnia:sleep varies, sometimes sleeps well and sometimes wakes up multiple times at night    Vitamin D : he needs to resume supplementation    Vitamin B12: his level was very low in Dec, he is getting B12 injections    Gout: under the care of Rheumatologist, taking Allopurinol 200 mg at night , he had a flare recently, took colchicine and it improve symptoms   Patient Active Problem List   Diagnosis Date Noted   Family history of rheumatoid arthritis 07/28/2018   Hyperuricemia 07/28/2018   Vitreous floater, bilateral 05/21/2018   Dry eye syndrome of both lacrimal glands 05/21/2018   Peroneal tendinitis  02/12/2018   Overweight (BMI 25.0-29.9) 09/18/2017   Bone spur of left ankle 03/28/2016   Insomnia, persistent 08/26/2015   History of hypertension 08/26/2015   Adult hypothyroidism 08/26/2015   B12 deficiency 08/26/2015   Gout of big toe 08/26/2015   Vitamin D deficiency 08/26/2015    Past Surgical History:  Procedure Laterality Date   EYE SURGERY Bilateral 2012   [facility]   FOOT SURGERY Right 2000   LASIK Bilateral 2012   [place] [facility]   MANDIBLE FRACTURE SURGERY  2001   RETINAL DETACHMENT SURGERY Right 2012   Laser for repair of non-traumatic RD   RHINOPLASTY  2013   VASECTOMY      Family History  Problem Relation Age of Onset   Anxiety disorder Mother    Rheum arthritis Mother    Hypothyroidism Father    Heart Problems Brother    Psoriasis Brother    Rheum arthritis Maternal Grandmother    Pancreatic cancer Maternal Grandfather    Liver cancer Paternal Grandfather    Glaucoma Paternal Grandfather     Social History   Tobacco Use   Smoking status: Never   Smokeless tobacco: Never  Substance Use Topics   Alcohol use: Yes    Alcohol/week: 0.0 standard drinks of alcohol    Comment: OCCASIONALLY     Current Outpatient Medications:  Acetaminophen 325 MG CAPS, Take 2 capsules by mouth as needed., Disp: , Rfl:    allopurinol (ZYLOPRIM) 100 MG tablet, TAKE 2 1/2 TABLETS BY MOUTH ONCE A DAY FOR 90 DAYS, Disp: 225 tablet, Rfl: 0   colchicine 0.6 MG tablet, Take 2 tablets (1.2 mg total) by mouth daily as needed (and repat in one hour if no resolution of pain)., Disp: 30 tablet, Rfl: 0   famotidine (PEPCID) 20 MG tablet, Take 1 tablet (20 mg total) by mouth 2 (two) times daily., Disp: 100 tablet, Rfl: 0   hydrOXYzine (VISTARIL) 25 MG capsule, Take 1-2 capsules (25-50 mg total) by mouth every 6 (six) hours as needed., Disp: 100 capsule, Rfl: 0   ibuprofen (ADVIL) 200 MG tablet, Take 2 tablets by mouth as needed., Disp: , Rfl:    levothyroxine (SYNTHROID) 75 MCG  tablet, TAKE 1 TABLET BY MOUTH ONCE A DAY, Disp: 90 tablet, Rfl: 1   tirzepatide (ZEPBOUND) 2.5 MG/0.5ML Pen, Inject 2.5 mg into the skin once a week., Disp: 2 mL, Rfl: 0   triamcinolone cream (KENALOG) 0.1 %, Apply 1 Application topically 2 (two) times daily., Disp: 454 g, Rfl: 0   Vitamin D, Ergocalciferol, (DRISDOL) 1.25 MG (50000 UNIT) CAPS capsule, Take 1 capsule (50,000 Units total) by mouth every 7 (seven) days., Disp: 12 capsule, Rfl: 1  Allergies  Allergen Reactions   Rhus Coriaria [[place]] Hives    I personally reviewed active problem list, medication list, allergies, family history, social history, health maintenance with the patient/caregiver today.   ROS  Constitutional: Negative for fever or weight change.  Respiratory: Negative for cough and shortness of breath.   Cardiovascular: Negative for chest pain or palpitations.  Gastrointestinal: Negative for abdominal pain, no bowel changes.  Musculoskeletal: Negative for gait problem or joint swelling.  Skin: Negative for rash.  Neurological: Negative for dizziness or headache.  No other specific complaints in a complete review of systems (except as listed in HPI above).   Objective  Vitals:   02/13/23 1137  BP: 118/78  Pulse: 68  Resp: 16  SpO2: 98%  Weight: 220 lb (99.8 kg)  Height: 5' 10"$  (1.778 m)    Body mass index is 31.57 kg/m.  Physical Exam  Constitutional: Patient appears well-developed and well-nourished. Obese  No distress.  HEENT: head atraumatic, normocephalic, pupils equal and reactive to light, neck supple Cardiovascular: Normal rate, regular rhythm and normal heart sounds.  No murmur heard. No BLE edema. Pulmonary/Chest: Effort normal and breath sounds normal. No respiratory distress. Abdominal: Soft.  There is no tenderness. Psychiatric: Patient has a normal mood and affect. behavior is normal. Judgment and thought content normal.    PHQ2/9:    02/13/2023   11:18 AM 12/26/2022    2:02 PM  10/17/2022   11:44 AM 08/28/2022    8:00 AM 08/12/2022    3:43 PM  Depression screen PHQ 2/9  Decreased Interest 0 0 0 0 0  Down, Depressed, Hopeless 0 0 0 2 0  PHQ - 2 Score 0 0 0 2 0  Altered sleeping 0 0 3 3 0  Tired, decreased energy 0 0 0 3 0  Change in appetite 0 0 3 0 0  Feeling bad or failure about yourself  0 0 0 0 0  Trouble concentrating 0 0 0 1 0  Moving slowly or fidgety/restless 0 0 0 0 0  Suicidal thoughts 0 0 0 0 0  PHQ-9 Score 0 0 6 9 0  Difficult  doing work/chores     Not difficult at all    phq 9 is negative   Fall Risk:    02/13/2023   11:19 AM 12/26/2022    2:03 PM 10/17/2022   11:38 AM 08/28/2022    8:00 AM 08/12/2022    3:39 PM  Fall Risk   Falls in the past year? 0 0 0 0 0  Number falls in past yr: 0  0 0 0  Injury with Fall? 0  0 0 0  Risk for fall due to : No Fall Risks No Fall Risks No Fall Risks No Fall Risks   Follow up Falls prevention discussed Falls prevention discussed;Education provided;Falls evaluation completed Falls prevention discussed Falls prevention discussed      Functional Status Survey: Is the patient deaf or have difficulty hearing?: No Does the patient have difficulty seeing, even when wearing glasses/contacts?: No Does the patient have difficulty concentrating, remembering, or making decisions?: No Does the patient have difficulty walking or climbing stairs?: No Does the patient have difficulty dressing or bathing?: No Does the patient have difficulty doing errands alone such as visiting a doctor's office or shopping?: No    Assessment & Plan  1. Adult hypothyroidism  - levothyroxine (SYNTHROID) 75 MCG tablet; TAKE 1 TABLET BY MOUTH ONCE A DAY  Dispense: 90 tablet; Refill: 1  2. B12 deficiency  Continue B12 supplementation   3. Vitamin D deficiency  Continue vitamin D supplementation  4. Obesity (BMI 30.0-34.9)  Discussed with the patient the risk posed by an increased BMI. Discussed importance of portion  control, calorie counting and at least 150 minutes of physical activity weekly. Avoid sweet beverages and drink more water. Eat at least 6 servings of fruit and vegetables daily    5. Dyslipidemia  On vegan diet   6. Chronic gout of right ankle, unspecified cause  Stable

## 2023-02-13 ENCOUNTER — Other Ambulatory Visit: Payer: Self-pay

## 2023-02-13 ENCOUNTER — Ambulatory Visit (INDEPENDENT_AMBULATORY_CARE_PROVIDER_SITE_OTHER): Payer: 59 | Admitting: Family Medicine

## 2023-02-13 ENCOUNTER — Encounter: Payer: Self-pay | Admitting: Family Medicine

## 2023-02-13 VITALS — BP 118/78 | HR 68 | Resp 16 | Ht 70.0 in | Wt 220.0 lb

## 2023-02-13 DIAGNOSIS — E785 Hyperlipidemia, unspecified: Secondary | ICD-10-CM | POA: Diagnosis not present

## 2023-02-13 DIAGNOSIS — E538 Deficiency of other specified B group vitamins: Secondary | ICD-10-CM

## 2023-02-13 DIAGNOSIS — E669 Obesity, unspecified: Secondary | ICD-10-CM | POA: Diagnosis not present

## 2023-02-13 DIAGNOSIS — M1A0710 Idiopathic chronic gout, right ankle and foot, without tophus (tophi): Secondary | ICD-10-CM

## 2023-02-13 DIAGNOSIS — E559 Vitamin D deficiency, unspecified: Secondary | ICD-10-CM | POA: Diagnosis not present

## 2023-02-13 DIAGNOSIS — E039 Hypothyroidism, unspecified: Secondary | ICD-10-CM

## 2023-02-13 MED ORDER — LEVOTHYROXINE SODIUM 75 MCG PO TABS
75.0000 ug | ORAL_TABLET | Freq: Every day | ORAL | 1 refills | Status: DC
  Filled 2023-02-13 (×3): qty 30, 30d supply, fill #0
  Filled 2023-03-13: qty 30, 30d supply, fill #1
  Filled 2023-04-20: qty 90, 90d supply, fill #2

## 2023-02-19 ENCOUNTER — Ambulatory Visit: Payer: No Typology Code available for payment source | Admitting: Dermatology

## 2023-02-23 ENCOUNTER — Encounter: Payer: Self-pay | Admitting: Family Medicine

## 2023-02-23 ENCOUNTER — Other Ambulatory Visit: Payer: Self-pay

## 2023-02-23 DIAGNOSIS — E669 Obesity, unspecified: Secondary | ICD-10-CM

## 2023-02-23 MED ORDER — ZEPBOUND 5 MG/0.5ML ~~LOC~~ SOAJ
5.0000 mg | SUBCUTANEOUS | 3 refills | Status: DC
  Filled 2023-02-23: qty 2, 28d supply, fill #0

## 2023-02-24 ENCOUNTER — Other Ambulatory Visit: Payer: Self-pay

## 2023-02-27 ENCOUNTER — Ambulatory Visit (INDEPENDENT_AMBULATORY_CARE_PROVIDER_SITE_OTHER): Payer: 59

## 2023-02-27 DIAGNOSIS — E538 Deficiency of other specified B group vitamins: Secondary | ICD-10-CM | POA: Diagnosis not present

## 2023-02-27 MED ORDER — CYANOCOBALAMIN 1000 MCG/ML IJ SOLN
1000.0000 ug | Freq: Once | INTRAMUSCULAR | Status: AC
  Administered 2023-02-27: 1000 ug via INTRAMUSCULAR

## 2023-03-06 ENCOUNTER — Ambulatory Visit: Payer: 59

## 2023-03-13 ENCOUNTER — Encounter: Payer: Self-pay | Admitting: Family Medicine

## 2023-03-16 ENCOUNTER — Other Ambulatory Visit: Payer: Self-pay

## 2023-03-16 ENCOUNTER — Other Ambulatory Visit: Payer: Self-pay | Admitting: Family Medicine

## 2023-03-16 DIAGNOSIS — E669 Obesity, unspecified: Secondary | ICD-10-CM

## 2023-03-16 MED ORDER — ZEPBOUND 5 MG/0.5ML ~~LOC~~ SOAJ
5.0000 mg | SUBCUTANEOUS | 0 refills | Status: DC
  Filled 2023-03-16 – 2023-03-18 (×2): qty 2, 28d supply, fill #0
  Filled 2023-03-26: qty 2, 28d supply, fill #1

## 2023-03-18 ENCOUNTER — Other Ambulatory Visit: Payer: Self-pay

## 2023-03-26 ENCOUNTER — Other Ambulatory Visit: Payer: Self-pay

## 2023-03-27 ENCOUNTER — Ambulatory Visit (INDEPENDENT_AMBULATORY_CARE_PROVIDER_SITE_OTHER): Payer: 59

## 2023-03-27 ENCOUNTER — Ambulatory Visit: Payer: 59

## 2023-03-27 DIAGNOSIS — E538 Deficiency of other specified B group vitamins: Secondary | ICD-10-CM

## 2023-03-27 MED ORDER — CYANOCOBALAMIN 1000 MCG/ML IJ SOLN
1000.0000 ug | Freq: Once | INTRAMUSCULAR | Status: AC
  Administered 2023-03-27: 1000 ug via INTRAMUSCULAR

## 2023-04-16 ENCOUNTER — Encounter: Payer: Self-pay | Admitting: Family Medicine

## 2023-04-16 ENCOUNTER — Other Ambulatory Visit: Payer: Self-pay

## 2023-04-16 ENCOUNTER — Other Ambulatory Visit: Payer: Self-pay | Admitting: Family Medicine

## 2023-04-16 DIAGNOSIS — E669 Obesity, unspecified: Secondary | ICD-10-CM

## 2023-04-16 DIAGNOSIS — E039 Hypothyroidism, unspecified: Secondary | ICD-10-CM

## 2023-04-16 MED ORDER — ZEPBOUND 5 MG/0.5ML ~~LOC~~ SOAJ
5.0000 mg | SUBCUTANEOUS | 0 refills | Status: DC
  Filled 2023-04-16: qty 2, 28d supply, fill #0

## 2023-04-16 MED ORDER — ALLOPURINOL 100 MG PO TABS
250.0000 mg | ORAL_TABLET | Freq: Every day | ORAL | 0 refills | Status: DC
  Filled 2023-04-16: qty 225, 90d supply, fill #0

## 2023-04-17 ENCOUNTER — Other Ambulatory Visit: Payer: Self-pay

## 2023-04-20 ENCOUNTER — Other Ambulatory Visit: Payer: Self-pay

## 2023-04-23 ENCOUNTER — Ambulatory Visit (INDEPENDENT_AMBULATORY_CARE_PROVIDER_SITE_OTHER): Payer: 59

## 2023-04-23 DIAGNOSIS — E538 Deficiency of other specified B group vitamins: Secondary | ICD-10-CM

## 2023-04-23 MED ORDER — CYANOCOBALAMIN 1000 MCG/ML IJ SOLN
1000.0000 ug | Freq: Once | INTRAMUSCULAR | Status: AC
  Administered 2023-04-23: 1000 ug via INTRAMUSCULAR

## 2023-04-24 ENCOUNTER — Ambulatory Visit: Payer: 59

## 2023-05-14 NOTE — Progress Notes (Signed)
Name: Oscar Allen   MRN: [id number]    DOB: 20-Aug-1986   Date:05/15/2023       Progress Note  Subjective  Chief Complaint  Follow Up  HPI  Hypothyroidism: last TSH was at goal, he is very compliant with medication. Continue current dose  He has chronic dry skin, usually bowel movements, continue current dose   Snoring/interrupted sleep: he had a high ESS - 14- in our office, also states father and younger brother have sleep apnea. He had a sleep study and it was negative. He has lost 33 lbs since Dec 2024 and seems to be doing okay  Obesity: he tried Contrave, but did not noticed any change in his appetite, also took Belviq for over 6 weeks without any weight loss. He responded well to Qsymia, started  taking it 11/2016 , initial weight of 251 lbs and he went as low as 198 lbs,  he stopped taking medication on his own  a couple of years ago and was able to maintain the weight around 200 lbs, however in 2023 he started to gain more weight and we discussed resuming medication, insurance is approving Zebpound , taking 5  mg dose and has lost 33 lbs  from Dec 23 until 05/24, insurance is no longer paying for Zepbound and he asked if he could go back to Qsymia. He is now overweight but can benefit for further weight loss. Rx sent to [facility] He is physically active, playing volleyball once a week, works in his yard and still goes to [employer], he eats mostly a plant base diet    Vitamin D : he  is taking rx vitamin D weekly   Vitamin B12: his level was very low in Dec, he is getting B12 injections    Gout: under the care of Rheumatologist, taking Allopurinol 200 mg at night , he had a flare recently, took colchicine and it improve symptoms Unchanged   Patient Active Problem List   Diagnosis Date Noted   Family history of rheumatoid arthritis 07/28/2018   Hyperuricemia 07/28/2018   Vitreous floater, bilateral 05/21/2018   Dry eye syndrome of both lacrimal glands 05/21/2018   Peroneal  tendinitis 02/12/2018   Overweight (BMI 25.0-29.9) 09/18/2017   Bone spur of left ankle 03/28/2016   Insomnia, persistent 08/26/2015   History of hypertension 08/26/2015   Adult hypothyroidism 08/26/2015   B12 deficiency 08/26/2015   Gout of big toe 08/26/2015   Vitamin D deficiency 08/26/2015    Past Surgical History:  Procedure Laterality Date   EYE SURGERY Bilateral 2012   Laser Eye Surgery   FOOT SURGERY Right 2000   LASIK Bilateral 2012   PRK OU   MANDIBLE FRACTURE SURGERY  2001   RETINAL DETACHMENT SURGERY Right 2012   Laser for repair of non-traumatic RD   RHINOPLASTY  2013   VASECTOMY      Family History  Problem Relation Age of Onset   Anxiety disorder Mother    Rheum arthritis Mother    Hypothyroidism Father    Heart Problems Brother    Psoriasis Brother    Rheum arthritis Maternal Grandmother    Pancreatic cancer Maternal Grandfather    Liver cancer Paternal Grandfather    Glaucoma Paternal Grandfather     Social History   Tobacco Use   Smoking status: Never   Smokeless tobacco: Never  Substance Use Topics   Alcohol use: Yes    Alcohol/week: 0.0 standard drinks of alcohol    Comment:  OCCASIONALLY     Current Outpatient Medications:    Acetaminophen 325 MG CAPS, Take 2 capsules by mouth as needed., Disp: , Rfl:    allopurinol (ZYLOPRIM) 100 MG tablet, Take 2.5 tablets (250 mg total) by mouth daily., Disp: 225 tablet, Rfl: 0   colchicine 0.6 MG tablet, Take 2 tablets (1.2 mg total) by mouth daily as needed (and repat in one hour if no resolution of pain)., Disp: 30 tablet, Rfl: 0   famotidine (PEPCID) 20 MG tablet, Take 1 tablet (20 mg total) by mouth 2 (two) times daily., Disp: 100 tablet, Rfl: 0   hydrOXYzine (VISTARIL) 25 MG capsule, Take 1-2 capsules (25-50 mg total) by mouth every 6 (six) hours as needed., Disp: 100 capsule, Rfl: 0   ibuprofen (ADVIL) 200 MG tablet, Take 2 tablets by mouth as needed., Disp: , Rfl:    levothyroxine (SYNTHROID) 75  MCG tablet, Take 1 tablet (75 mcg total) by mouth daily., Disp: 90 tablet, Rfl: 1   tirzepatide (ZEPBOUND) 5 MG/0.5ML Pen, Inject 5 mg into the skin once a week., Disp: 6 mL, Rfl: 0   triamcinolone cream (KENALOG) 0.1 %, Apply 1 Application topically 2 (two) times daily., Disp: 454 g, Rfl: 0   Vitamin D, Ergocalciferol, (DRISDOL) 1.25 MG (50000 UNIT) CAPS capsule, Take 1 capsule (50,000 Units total) by mouth every 7 (seven) days., Disp: 12 capsule, Rfl: 1  Allergies  Allergen Reactions   Rhus Coriaria [Sumac] Hives    I personally reviewed active problem list, medication list, allergies, family history, social history, health maintenance with the patient/caregiver today.   ROS  Constitutional: Negative for fever , positive for weight change.  Respiratory: Negative for cough and shortness of breath.   Cardiovascular: Negative for chest pain or palpitations.  Gastrointestinal: Negative for abdominal pain, no bowel changes.  Musculoskeletal: Negative for gait problem or joint swelling.  Skin: Negative for rash.  Neurological: Negative for dizziness or headache.  No other specific complaints in a complete review of systems (except as listed in HPI above).   Objective  Vitals:   05/15/23 1058  BP: 126/70  Pulse: 67  Resp: 16  SpO2: 99%  Weight: 203 lb (92.1 kg)  Height: 5\' 10"  (1.778 m)    Body mass index is 29.13 kg/m.  Physical Exam  Constitutional: Patient appears well-developed and well-nourished. Obese  No distress.  HEENT: head atraumatic, normocephalic, pupils equal and reactive to light, neck supple Cardiovascular: Normal rate, regular rhythm and normal heart sounds.  No murmur heard. No BLE edema. Pulmonary/Chest: Effort normal and breath sounds normal. No respiratory distress. Abdominal: Soft.  There is no tenderness. Psychiatric: Patient has a normal mood and affect. behavior is normal. Judgment and thought content normal.   PHQ2/9:    05/15/2023   10:57 AM  02/13/2023   11:18 AM 12/26/2022    2:02 PM 10/17/2022   11:44 AM 08/28/2022    8:00 AM  Depression screen PHQ 2/9  Decreased Interest 0 0 0 0 0  Down, Depressed, Hopeless 0 0 0 0 2  PHQ - 2 Score 0 0 0 0 2  Altered sleeping 0 0 0 3 3  Tired, decreased energy 0 0 0 0 3  Change in appetite 0 0 0 3 0  Feeling bad or failure about yourself  0 0 0 0 0  Trouble concentrating 0 0 0 0 1  Moving slowly or fidgety/restless 0 0 0 0 0  Suicidal thoughts 0 0 0 0 0  PHQ-9 Score 0 0 0 6 9    phq 9 is negative   Fall Risk:    05/15/2023   10:57 AM 02/13/2023   11:19 AM 12/26/2022    2:03 PM 10/17/2022   11:38 AM 08/28/2022    8:00 AM  Fall Risk   Falls in the past year? 0 0 0 0 0  Number falls in past yr: 0 0  0 0  Injury with Fall? 0 0  0 0  Risk for fall due to : No Fall Risks No Fall Risks No Fall Risks No Fall Risks No Fall Risks  Follow up Falls prevention discussed Falls prevention discussed Falls prevention discussed;Education provided;Falls evaluation completed Falls prevention discussed Falls prevention discussed      Functional Status Survey: Is the patient deaf or have difficulty hearing?: No Does the patient have difficulty seeing, even when wearing glasses/contacts?: No Does the patient have difficulty concentrating, remembering, or making decisions?: No Does the patient have difficulty walking or climbing stairs?: No Does the patient have difficulty dressing or bathing?: No Does the patient have difficulty doing errands alone such as visiting a doctor's office or shopping?: No    Assessment & Plan  1. Adult hypothyroidism  - levothyroxine (SYNTHROID) 75 MCG tablet; Take 1 tablet (75 mcg total) by mouth daily.  Dispense: 90 tablet; Refill: 1  2. B12 deficiency  - cyanocobalamin (VITAMIN B12) injection 1,000 mcg  3. Dyslipidemia  He is on vegan diet   4. Vitamin D deficiency  - Vitamin D, Ergocalciferol, (DRISDOL) 1.25 MG (50000 UNIT) CAPS capsule; Take 1  capsule (50,000 Units total) by mouth every 7 (seven) days.  Dispense: 12 capsule; Refill: 1  5. Overweight (BMI 25.0-29.9)  - Phentermine-Topiramate (QSYMIA) 3.75-23 MG CP24; Take 1 capsule by mouth every morning.  Dispense: 30 capsule; Refill: 0 - Phentermine-Topiramate (QSYMIA) 7.5-46 MG CP24; Take 1 capsule by mouth every morning.  Dispense: 30 capsule; Refill: 1  6. Chronic gout of right ankle, unspecified cause   7. History of obesity  - Phentermine-Topiramate (QSYMIA) 3.75-23 MG CP24; Take 1 capsule by mouth every morning.  Dispense: 30 capsule; Refill: 0 - Phentermine-Topiramate (QSYMIA) 7.5-46 MG CP24; Take 1 capsule by mouth every morning.  Dispense: 30 capsule; Refill: 1

## 2023-05-15 ENCOUNTER — Other Ambulatory Visit: Payer: Self-pay

## 2023-05-15 ENCOUNTER — Ambulatory Visit (INDEPENDENT_AMBULATORY_CARE_PROVIDER_SITE_OTHER): Payer: 59 | Admitting: Family Medicine

## 2023-05-15 ENCOUNTER — Encounter: Payer: Self-pay | Admitting: Family Medicine

## 2023-05-15 VITALS — BP 126/70 | HR 67 | Resp 16 | Ht 70.0 in | Wt 203.0 lb

## 2023-05-15 DIAGNOSIS — E663 Overweight: Secondary | ICD-10-CM

## 2023-05-15 DIAGNOSIS — E559 Vitamin D deficiency, unspecified: Secondary | ICD-10-CM | POA: Diagnosis not present

## 2023-05-15 DIAGNOSIS — Z8639 Personal history of other endocrine, nutritional and metabolic disease: Secondary | ICD-10-CM

## 2023-05-15 DIAGNOSIS — E538 Deficiency of other specified B group vitamins: Secondary | ICD-10-CM | POA: Diagnosis not present

## 2023-05-15 DIAGNOSIS — E039 Hypothyroidism, unspecified: Secondary | ICD-10-CM

## 2023-05-15 DIAGNOSIS — M1A0710 Idiopathic chronic gout, right ankle and foot, without tophus (tophi): Secondary | ICD-10-CM

## 2023-05-15 DIAGNOSIS — E785 Hyperlipidemia, unspecified: Secondary | ICD-10-CM

## 2023-05-15 MED ORDER — CYANOCOBALAMIN 1000 MCG/ML IJ SOLN
1000.0000 ug | Freq: Once | INTRAMUSCULAR | Status: AC
  Administered 2023-05-15: 1000 ug via INTRAMUSCULAR

## 2023-05-15 MED ORDER — LEVOTHYROXINE SODIUM 75 MCG PO TABS
75.0000 ug | ORAL_TABLET | Freq: Every day | ORAL | 1 refills | Status: DC
  Filled 2023-05-15 – 2023-07-16 (×2): qty 90, 90d supply, fill #0
  Filled 2023-10-20: qty 90, 90d supply, fill #1

## 2023-05-15 MED ORDER — VITAMIN D (ERGOCALCIFEROL) 1.25 MG (50000 UNIT) PO CAPS
50000.0000 [IU] | ORAL_CAPSULE | ORAL | 1 refills | Status: DC
Start: 2023-05-15 — End: 2023-10-09
  Filled 2023-05-15 – 2023-06-15 (×3): qty 12, 84d supply, fill #0
  Filled 2023-09-12: qty 12, 84d supply, fill #1

## 2023-05-15 MED ORDER — QSYMIA 7.5-46 MG PO CP24
1.0000 | ORAL_CAPSULE | ORAL | 1 refills | Status: DC
  Filled 2023-05-15 – 2023-06-15 (×3): qty 30, 30d supply, fill #0
  Filled 2023-07-16: qty 30, 30d supply, fill #1

## 2023-05-15 MED ORDER — QSYMIA 3.75-23 MG PO CP24
1.0000 | ORAL_CAPSULE | ORAL | 0 refills | Status: DC
  Filled 2023-05-15 – 2023-05-20 (×2): qty 30, 30d supply, fill #0

## 2023-05-19 ENCOUNTER — Other Ambulatory Visit: Payer: Self-pay

## 2023-05-20 ENCOUNTER — Other Ambulatory Visit: Payer: Self-pay

## 2023-05-22 ENCOUNTER — Other Ambulatory Visit: Payer: Self-pay

## 2023-06-12 ENCOUNTER — Ambulatory Visit (INDEPENDENT_AMBULATORY_CARE_PROVIDER_SITE_OTHER): Payer: 59

## 2023-06-12 DIAGNOSIS — E538 Deficiency of other specified B group vitamins: Secondary | ICD-10-CM

## 2023-06-12 MED ORDER — CYANOCOBALAMIN 1000 MCG/ML IJ SOLN
1000.0000 ug | Freq: Once | INTRAMUSCULAR | Status: AC
Start: 2023-06-12 — End: 2023-06-12
  Administered 2023-06-12: 1000 ug via INTRAMUSCULAR

## 2023-06-12 NOTE — Progress Notes (Signed)
Patient presented to clinic in expressed good health today for a B-12 injection. Patient tolerated injection well with no immediate side effects noted at clinic departure.

## 2023-06-15 ENCOUNTER — Other Ambulatory Visit: Payer: Self-pay

## 2023-07-10 ENCOUNTER — Ambulatory Visit (INDEPENDENT_AMBULATORY_CARE_PROVIDER_SITE_OTHER): Payer: 59

## 2023-07-10 DIAGNOSIS — E538 Deficiency of other specified B group vitamins: Secondary | ICD-10-CM

## 2023-07-10 MED ORDER — CYANOCOBALAMIN 1000 MCG/ML IJ SOLN
1000.0000 ug | Freq: Once | INTRAMUSCULAR | Status: AC
Start: 2023-07-10 — End: 2023-07-10
  Administered 2023-07-10: 1000 ug via INTRAMUSCULAR

## 2023-07-16 ENCOUNTER — Other Ambulatory Visit: Payer: Self-pay | Admitting: Family Medicine

## 2023-07-16 ENCOUNTER — Other Ambulatory Visit: Payer: Self-pay

## 2023-07-17 ENCOUNTER — Other Ambulatory Visit: Payer: Self-pay

## 2023-07-20 ENCOUNTER — Other Ambulatory Visit: Payer: Self-pay

## 2023-07-31 ENCOUNTER — Other Ambulatory Visit: Payer: Self-pay | Admitting: Family Medicine

## 2023-07-31 ENCOUNTER — Other Ambulatory Visit: Payer: Self-pay

## 2023-07-31 MED FILL — Allopurinol Tab 100 MG: ORAL | 90 days supply | Qty: 225 | Fill #0 | Status: AC

## 2023-08-01 ENCOUNTER — Other Ambulatory Visit: Payer: Self-pay

## 2023-08-02 ENCOUNTER — Other Ambulatory Visit: Payer: Self-pay

## 2023-08-03 ENCOUNTER — Other Ambulatory Visit: Payer: Self-pay

## 2023-08-13 ENCOUNTER — Other Ambulatory Visit: Payer: Self-pay | Admitting: Family Medicine

## 2023-08-13 ENCOUNTER — Other Ambulatory Visit: Payer: Self-pay

## 2023-08-13 DIAGNOSIS — Z8639 Personal history of other endocrine, nutritional and metabolic disease: Secondary | ICD-10-CM

## 2023-08-13 DIAGNOSIS — E663 Overweight: Secondary | ICD-10-CM

## 2023-08-14 ENCOUNTER — Other Ambulatory Visit: Payer: Self-pay

## 2023-08-14 ENCOUNTER — Ambulatory Visit (INDEPENDENT_AMBULATORY_CARE_PROVIDER_SITE_OTHER): Payer: 59

## 2023-08-14 ENCOUNTER — Ambulatory Visit: Payer: 59

## 2023-08-14 DIAGNOSIS — E538 Deficiency of other specified B group vitamins: Secondary | ICD-10-CM

## 2023-08-14 MED ORDER — CYANOCOBALAMIN 1000 MCG/ML IJ SOLN
1000.0000 ug | Freq: Once | INTRAMUSCULAR | Status: AC
Start: 2023-08-14 — End: 2023-08-14
  Administered 2023-08-14: 1000 ug via INTRAMUSCULAR

## 2023-08-14 MED FILL — Phentermine HCl-Topiramate Cap ER 24HR 7.5-46 MG: ORAL | 30 days supply | Qty: 30 | Fill #0 | Status: AC

## 2023-08-16 ENCOUNTER — Other Ambulatory Visit: Payer: Self-pay

## 2023-08-17 ENCOUNTER — Other Ambulatory Visit: Payer: Self-pay

## 2023-08-24 ENCOUNTER — Other Ambulatory Visit: Payer: Self-pay

## 2023-08-26 ENCOUNTER — Other Ambulatory Visit: Payer: Self-pay

## 2023-09-12 ENCOUNTER — Other Ambulatory Visit: Payer: Self-pay | Admitting: Family Medicine

## 2023-09-12 DIAGNOSIS — Z8639 Personal history of other endocrine, nutritional and metabolic disease: Secondary | ICD-10-CM

## 2023-09-12 DIAGNOSIS — E663 Overweight: Secondary | ICD-10-CM

## 2023-09-14 ENCOUNTER — Ambulatory Visit (INDEPENDENT_AMBULATORY_CARE_PROVIDER_SITE_OTHER): Payer: 59

## 2023-09-14 ENCOUNTER — Encounter: Payer: Self-pay | Admitting: Family Medicine

## 2023-09-14 ENCOUNTER — Other Ambulatory Visit: Payer: Self-pay

## 2023-09-14 DIAGNOSIS — E538 Deficiency of other specified B group vitamins: Secondary | ICD-10-CM | POA: Diagnosis not present

## 2023-09-14 MED ORDER — CYANOCOBALAMIN 1000 MCG/ML IJ SOLN
1000.0000 ug | Freq: Once | INTRAMUSCULAR | Status: AC
Start: 2023-09-14 — End: 2023-09-14
  Administered 2023-09-14: 1000 ug via INTRAMUSCULAR

## 2023-09-15 ENCOUNTER — Other Ambulatory Visit: Payer: Self-pay

## 2023-09-15 NOTE — Telephone Encounter (Signed)
Lvm for pt to schedule appt per Dr Carlynn Purl, "He needs to be seen sooner, I will send 30 days once visit scheduled"

## 2023-09-16 ENCOUNTER — Other Ambulatory Visit: Payer: Self-pay

## 2023-09-16 ENCOUNTER — Other Ambulatory Visit: Payer: Self-pay | Admitting: Family Medicine

## 2023-09-16 DIAGNOSIS — E663 Overweight: Secondary | ICD-10-CM

## 2023-09-16 DIAGNOSIS — Z8639 Personal history of other endocrine, nutritional and metabolic disease: Secondary | ICD-10-CM

## 2023-09-17 ENCOUNTER — Other Ambulatory Visit: Payer: Self-pay | Admitting: Family Medicine

## 2023-09-17 ENCOUNTER — Other Ambulatory Visit: Payer: Self-pay

## 2023-09-17 DIAGNOSIS — Z8639 Personal history of other endocrine, nutritional and metabolic disease: Secondary | ICD-10-CM

## 2023-09-17 DIAGNOSIS — E663 Overweight: Secondary | ICD-10-CM

## 2023-09-17 MED ORDER — QSYMIA 7.5-46 MG PO CP24
1.0000 | ORAL_CAPSULE | ORAL | 0 refills | Status: DC
Start: 2023-09-17 — End: 2023-10-09
  Filled 2023-09-17 – 2023-09-18 (×2): qty 30, 30d supply, fill #0

## 2023-09-18 ENCOUNTER — Other Ambulatory Visit: Payer: Self-pay

## 2023-09-18 ENCOUNTER — Ambulatory Visit: Payer: 59

## 2023-10-08 NOTE — Progress Notes (Signed)
Name: Oscar Allen   MRN: [id number]    DOB: 1986-10-31   Date:10/09/2023       Progress Note  Subjective  Chief Complaint  Medication Refill  HPI  Hypothyroidism: last TSH was at goal, he is very compliant with medication. Continue current dose  He has chronic dry skin, usually bowel movements, he is due for repeat labs   Snoring/interrupted sleep: he had a high ESS - 14- in our office, also states father and younger brother have sleep apnea. He had a sleep study and it was negative. He does not sleep well when hot   Obesity: he tried Contrave, but did not noticed any change in his appetite, also took Belviq for over 6 weeks without any weight loss. He responded well to Qsymia, started  taking it 11/2016 , initial weight of 251 lbs and he went as low as 198 lbs,  he stopped taking medication on his own  a couple of years ago and was able to maintain the weight around 200 lbs, however in 2023 he started to gain more weight and we discussed resuming medication, insurance is approving Zebpound , taking 5  mg dose and has lost 33 lbs  from Dec 23 until 05/24, insurance is no longer paying for Zepbound and he asked if he could go back to Qsymia. His weight was 234 lbs in in Dec, started medication is May when weight was already trending down and today is down to 205 lbs. We will adjust dose today    Vitamin D : he  is taking rx vitamin D weekly, we will recheck labs   Vitamin B12: his level was very low in Dec, he is getting B12 injections but worried about the cost next year with change of his insurance, his wife is a Engineer, civil (consulting) and we may be able to give him the rx. He has tried SL B12 in the past    Gout: he used to see a  Rheumatologist, no episodes of gout since he has been taking Allopurinol daily, we will check uric acid level   Patient Active Problem List   Diagnosis Date Noted   Family history of rheumatoid arthritis 07/28/2018   Hyperuricemia 07/28/2018   Vitreous floater,  bilateral 05/21/2018   Dry eye syndrome of both lacrimal glands 05/21/2018   Peroneal tendinitis 02/12/2018   Overweight (BMI 25.0-29.9) 09/18/2017   Bone spur of left ankle 03/28/2016   Insomnia, persistent 08/26/2015   History of hypertension 08/26/2015   Adult hypothyroidism 08/26/2015   B12 deficiency 08/26/2015   Gout of big toe 08/26/2015   Vitamin D deficiency 08/26/2015    Past Surgical History:  Procedure Laterality Date   EYE SURGERY Bilateral 2012   Laser Eye Surgery   FOOT SURGERY Right 2000   LASIK Bilateral 2012   PRK OU   MANDIBLE FRACTURE SURGERY  2001   RETINAL DETACHMENT SURGERY Right 2012   Laser for repair of non-traumatic RD   RHINOPLASTY  2013   VASECTOMY      Family History  Problem Relation Age of Onset   Anxiety disorder Mother    Rheum arthritis Mother    Hypothyroidism Father    Heart Problems Brother    Psoriasis Brother    Rheum arthritis Maternal Grandmother    Pancreatic cancer Maternal Grandfather    Liver cancer Paternal Grandfather    Glaucoma Paternal Grandfather     Social History   Tobacco Use   Smoking status: Never  Smokeless tobacco: Never  Substance Use Topics   Alcohol use: Yes    Alcohol/week: 0.0 standard drinks of alcohol    Comment: OCCASIONALLY     Current Outpatient Medications:    Acetaminophen 325 MG CAPS, Take 2 capsules by mouth as needed., Disp: , Rfl:    allopurinol (ZYLOPRIM) 100 MG tablet, Take 2.5 tablets (250 mg total) by mouth daily., Disp: 225 tablet, Rfl: 0   colchicine 0.6 MG tablet, Take 2 tablets (1.2 mg total) by mouth daily as needed (and repat in one hour if no resolution of pain)., Disp: 30 tablet, Rfl: 0   famotidine (PEPCID) 20 MG tablet, Take 1 tablet (20 mg total) by mouth 2 (two) times daily., Disp: 100 tablet, Rfl: 0   hydrOXYzine (VISTARIL) 25 MG capsule, Take 1-2 capsules (25-50 mg total) by mouth every 6 (six) hours as needed., Disp: 100 capsule, Rfl: 0   ibuprofen (ADVIL) 200 MG  tablet, Take 2 tablets by mouth as needed., Disp: , Rfl:    levothyroxine (SYNTHROID) 75 MCG tablet, Take 1 tablet (75 mcg total) by mouth daily., Disp: 90 tablet, Rfl: 1   Phentermine-Topiramate (QSYMIA) 7.5-46 MG CP24, Take 1 capsule by mouth every morning., Disp: 30 capsule, Rfl: 0   triamcinolone cream (KENALOG) 0.1 %, Apply 1 Application topically 2 (two) times daily., Disp: 454 g, Rfl: 0   Vitamin D, Ergocalciferol, (DRISDOL) 1.25 MG (50000 UNIT) CAPS capsule, Take 1 capsule (50,000 Units total) by mouth every 7 (seven) days., Disp: 12 capsule, Rfl: 1  Allergies  Allergen Reactions   Rhus Coriaria [Sumac] Hives    I personally reviewed active problem list, medication list, allergies, family history, social history, health maintenance with the patient/caregiver today.   ROS  Ten systems reviewed and is negative except as mentioned in HPI    Objective  Vitals:   10/09/23 1511  BP: 128/76  Pulse: 77  Resp: 16  SpO2: 99%  Weight: 205 lb (93 kg)  Height: 5\' 9"  (1.753 m)    Body mass index is 30.27 kg/m.  Physical Exam  Constitutional: Patient appears well-developed and well-nourished. Obese  No distress.  HEENT: head atraumatic, normocephalic, pupils equal and reactive to light, neck supple Cardiovascular: Normal rate, regular rhythm and normal heart sounds.  No murmur heard. No BLE edema. Pulmonary/Chest: Effort normal and breath sounds normal. No respiratory distress. Abdominal: Soft.  There is no tenderness. Psychiatric: Patient has a normal mood and affect. behavior is normal. Judgment and thought content normal.   PHQ2/9:    10/09/2023    3:11 PM 05/15/2023   10:57 AM 02/13/2023   11:18 AM 12/26/2022    2:02 PM 10/17/2022   11:44 AM  Depression screen PHQ 2/9  Decreased Interest 0 0 0 0 0  Down, Depressed, Hopeless 0 0 0 0 0  PHQ - 2 Score 0 0 0 0 0  Altered sleeping 0 0 0 0 3  Tired, decreased energy 0 0 0 0 0  Change in appetite 0 0 0 0 3  Feeling bad or  failure about yourself  0 0 0 0 0  Trouble concentrating 0 0 0 0 0  Moving slowly or fidgety/restless 0 0 0 0 0  Suicidal thoughts 0 0 0 0 0  PHQ-9 Score 0 0 0 0 6    phq 9 is negative   Fall Risk:    10/09/2023    3:11 PM 05/15/2023   10:57 AM 02/13/2023   11:19 AM 12/26/2022  2:03 PM 10/17/2022   11:38 AM  Fall Risk   Falls in the past year? 0 0 0 0 0  Number falls in past yr: 0 0 0  0  Injury with Fall? 0 0 0  0  Risk for fall due to : No Fall Risks No Fall Risks No Fall Risks No Fall Risks No Fall Risks  Follow up Falls prevention discussed Falls prevention discussed Falls prevention discussed Falls prevention discussed;Education provided;Falls evaluation completed Falls prevention discussed      Functional Status Survey: Is the patient deaf or have difficulty hearing?: No Does the patient have difficulty seeing, even when wearing glasses/contacts?: No Does the patient have difficulty concentrating, remembering, or making decisions?: No Does the patient have difficulty walking or climbing stairs?: No Does the patient have difficulty dressing or bathing?: No Does the patient have difficulty doing errands alone such as visiting a doctor's office or shopping?: No    Assessment & Plan  1. Adult hypothyroidism  - TSH  2. Dyslipidemia  - Lipid panel  3. B12 deficiency  - B12 and Folate Panel  4. Vitamin D deficiency  - VITAMIN D 25 Hydroxy (Vit-D Deficiency, Fractures) - Vitamin D, Ergocalciferol, (DRISDOL) 1.25 MG (50000 UNIT) CAPS capsule; Take 1 capsule (50,000 Units total) by mouth every 7 (seven) days.  Dispense: 12 capsule; Refill: 1  5. Obesity (BMI 30.0-34.9)  - Phentermine-Topiramate (QSYMIA) 11.25-69 MG CP24; Take 1 capsule by mouth every morning.  Dispense: 30 capsule; Refill: 2  We will adjust dose   6. Chronic gout of right ankle, unspecified cause  - Uric acid  7. Diabetes mellitus screening  - Hemoglobin A1c  8. Long-term use of  high-risk medication  - CBC with Differential/Platelet - COMPLETE METABOLIC PANEL WITH GFR

## 2023-10-09 ENCOUNTER — Other Ambulatory Visit (HOSPITAL_COMMUNITY): Payer: Self-pay

## 2023-10-09 ENCOUNTER — Other Ambulatory Visit: Payer: Self-pay

## 2023-10-09 ENCOUNTER — Encounter: Payer: Self-pay | Admitting: Family Medicine

## 2023-10-09 ENCOUNTER — Ambulatory Visit (INDEPENDENT_AMBULATORY_CARE_PROVIDER_SITE_OTHER): Payer: 59 | Admitting: Family Medicine

## 2023-10-09 VITALS — BP 128/76 | HR 77 | Resp 16 | Ht 69.0 in | Wt 205.0 lb

## 2023-10-09 DIAGNOSIS — E66811 Obesity, class 1: Secondary | ICD-10-CM

## 2023-10-09 DIAGNOSIS — E559 Vitamin D deficiency, unspecified: Secondary | ICD-10-CM

## 2023-10-09 DIAGNOSIS — E538 Deficiency of other specified B group vitamins: Secondary | ICD-10-CM

## 2023-10-09 DIAGNOSIS — Z131 Encounter for screening for diabetes mellitus: Secondary | ICD-10-CM

## 2023-10-09 DIAGNOSIS — E039 Hypothyroidism, unspecified: Secondary | ICD-10-CM

## 2023-10-09 DIAGNOSIS — Z79899 Other long term (current) drug therapy: Secondary | ICD-10-CM

## 2023-10-09 DIAGNOSIS — M1A0710 Idiopathic chronic gout, right ankle and foot, without tophus (tophi): Secondary | ICD-10-CM

## 2023-10-09 DIAGNOSIS — E785 Hyperlipidemia, unspecified: Secondary | ICD-10-CM

## 2023-10-09 MED ORDER — QSYMIA 11.25-69 MG PO CP24
1.0000 | ORAL_CAPSULE | Freq: Every morning | ORAL | 2 refills | Status: DC
Start: 2023-10-09 — End: 2024-01-01
  Filled 2023-10-09: qty 30, fill #0
  Filled 2023-10-15 – 2023-10-20 (×3): qty 30, 30d supply, fill #0
  Filled 2023-11-16 – 2023-11-20 (×2): qty 30, 30d supply, fill #1
  Filled 2023-12-22 – 2023-12-25 (×3): qty 30, 30d supply, fill #2

## 2023-10-09 MED ORDER — ALLOPURINOL 100 MG PO TABS
250.0000 mg | ORAL_TABLET | Freq: Every day | ORAL | 1 refills | Status: DC
  Filled 2023-10-09: qty 225, 90d supply, fill #0
  Filled 2024-02-25: qty 225, 90d supply, fill #1

## 2023-10-09 MED ORDER — VITAMIN D (ERGOCALCIFEROL) 1.25 MG (50000 UNIT) PO CAPS
50000.0000 [IU] | ORAL_CAPSULE | ORAL | 1 refills | Status: DC
  Filled 2023-10-09 – 2023-12-07 (×2): qty 12, 84d supply, fill #0
  Filled 2024-03-07: qty 12, 84d supply, fill #1

## 2023-10-12 ENCOUNTER — Other Ambulatory Visit: Payer: Self-pay

## 2023-10-15 ENCOUNTER — Encounter: Payer: Self-pay | Admitting: Family Medicine

## 2023-10-15 ENCOUNTER — Other Ambulatory Visit: Payer: Self-pay

## 2023-10-15 ENCOUNTER — Ambulatory Visit: Payer: 59 | Admitting: Family Medicine

## 2023-10-16 ENCOUNTER — Ambulatory Visit (INDEPENDENT_AMBULATORY_CARE_PROVIDER_SITE_OTHER): Payer: 59

## 2023-10-16 DIAGNOSIS — E039 Hypothyroidism, unspecified: Secondary | ICD-10-CM | POA: Diagnosis not present

## 2023-10-16 DIAGNOSIS — E785 Hyperlipidemia, unspecified: Secondary | ICD-10-CM | POA: Diagnosis not present

## 2023-10-16 DIAGNOSIS — E538 Deficiency of other specified B group vitamins: Secondary | ICD-10-CM

## 2023-10-16 DIAGNOSIS — Z79899 Other long term (current) drug therapy: Secondary | ICD-10-CM | POA: Diagnosis not present

## 2023-10-16 DIAGNOSIS — M1A0710 Idiopathic chronic gout, right ankle and foot, without tophus (tophi): Secondary | ICD-10-CM | POA: Diagnosis not present

## 2023-10-16 DIAGNOSIS — Z131 Encounter for screening for diabetes mellitus: Secondary | ICD-10-CM | POA: Diagnosis not present

## 2023-10-16 DIAGNOSIS — E559 Vitamin D deficiency, unspecified: Secondary | ICD-10-CM | POA: Diagnosis not present

## 2023-10-16 MED ORDER — CYANOCOBALAMIN 1000 MCG/ML IJ SOLN
1000.0000 ug | Freq: Once | INTRAMUSCULAR | Status: AC
Start: 2023-10-16 — End: 2023-10-16
  Administered 2023-10-16: 1000 ug via INTRAMUSCULAR

## 2023-10-17 LAB — COMPLETE METABOLIC PANEL WITH GFR
AG Ratio: 2.3 (calc) (ref 1.0–2.5)
ALT: 21 U/L (ref 9–46)
AST: 16 U/L (ref 10–40)
Albumin: 4.6 g/dL (ref 3.6–5.1)
Alkaline phosphatase (APISO): 46 U/L (ref 36–130)
BUN: 13 mg/dL (ref 7–25)
CO2: 26 mmol/L (ref 20–32)
Calcium: 9.6 mg/dL (ref 8.6–10.3)
Chloride: 109 mmol/L (ref 98–110)
Creat: 0.89 mg/dL (ref 0.60–1.26)
Globulin: 2 g/dL (ref 1.9–3.7)
Glucose, Bld: 86 mg/dL (ref 65–99)
Potassium: 4.1 mmol/L (ref 3.5–5.3)
Sodium: 140 mmol/L (ref 135–146)
Total Bilirubin: 0.6 mg/dL (ref 0.2–1.2)
Total Protein: 6.6 g/dL (ref 6.1–8.1)
eGFR: 112 mL/min/{1.73_m2} (ref >=60)

## 2023-10-17 LAB — CBC WITH DIFFERENTIAL/PLATELET
Absolute Lymphocytes: 2190 {cells}/uL (ref 850–3900)
Absolute Monocytes: 350 {cells}/uL (ref 200–950)
Basophils Absolute: 50 {cells}/uL (ref 0–200)
Basophils Relative: 1 %
Eosinophils Absolute: 180 {cells}/uL (ref 15–500)
Eosinophils Relative: 3.6 %
HCT: 41.2 % (ref 38.5–50.0)
Hemoglobin: 13.4 g/dL (ref 13.2–17.1)
MCH: 27 pg (ref 27.0–33.0)
MCHC: 32.5 g/dL (ref 32.0–36.0)
MCV: 83.1 fL (ref 80.0–100.0)
MPV: 11.2 fL (ref 7.5–12.5)
Monocytes Relative: 7 %
Neutro Abs: 2230 {cells}/uL (ref 1500–7800)
Neutrophils Relative %: 44.6 %
Platelets: 270 10*3/uL (ref 140–400)
RBC: 4.96 10*6/uL (ref 4.20–5.80)
RDW: 15.1 % — ABNORMAL HIGH (ref 11.0–15.0)
Total Lymphocyte: 43.8 %
WBC: 5 10*3/uL (ref 3.8–10.8)

## 2023-10-17 LAB — HEMOGLOBIN A1C
Hgb A1c MFr Bld: 5.2 %{Hb} (ref <5.7)
Mean Plasma Glucose: 103 mg/dL
eAG (mmol/L): 5.7 mmol/L

## 2023-10-17 LAB — LIPID PANEL
Cholesterol: 200 mg/dL — ABNORMAL HIGH (ref <200)
HDL: 62 mg/dL (ref >=40)
LDL Cholesterol (Calc): 122 mg/dL — ABNORMAL HIGH
Non-HDL Cholesterol (Calc): 138 mg/dL — ABNORMAL HIGH (ref <130)
Total CHOL/HDL Ratio: 3.2 (calc) (ref <5.0)
Triglycerides: 66 mg/dL (ref <150)

## 2023-10-17 LAB — B12 AND FOLATE PANEL
Folate: 10 ng/mL
Vitamin B-12: 300 pg/mL (ref 200–1100)

## 2023-10-17 LAB — URIC ACID: Uric Acid, Serum: 4.9 mg/dL (ref 4.0–8.0)

## 2023-10-17 LAB — TSH: TSH: 1.79 m[IU]/L (ref 0.40–4.50)

## 2023-10-17 LAB — VITAMIN D 25 HYDROXY (VIT D DEFICIENCY, FRACTURES): Vit D, 25-Hydroxy: 71 ng/mL (ref 30–100)

## 2023-10-20 ENCOUNTER — Other Ambulatory Visit: Payer: Self-pay

## 2023-10-21 ENCOUNTER — Other Ambulatory Visit: Payer: Self-pay

## 2023-10-30 ENCOUNTER — Ambulatory Visit (INDEPENDENT_AMBULATORY_CARE_PROVIDER_SITE_OTHER): Payer: 59

## 2023-10-30 DIAGNOSIS — E538 Deficiency of other specified B group vitamins: Secondary | ICD-10-CM

## 2023-10-30 MED ORDER — CYANOCOBALAMIN 1000 MCG/ML IJ SOLN
1000.0000 ug | Freq: Once | INTRAMUSCULAR | Status: AC
Start: 2023-10-30 — End: 2023-10-30
  Administered 2023-10-30: 1000 ug via INTRAMUSCULAR

## 2023-11-06 ENCOUNTER — Ambulatory Visit: Payer: 59 | Admitting: Family Medicine

## 2023-11-13 ENCOUNTER — Ambulatory Visit (INDEPENDENT_AMBULATORY_CARE_PROVIDER_SITE_OTHER): Payer: 59

## 2023-11-13 DIAGNOSIS — E538 Deficiency of other specified B group vitamins: Secondary | ICD-10-CM

## 2023-11-13 MED ORDER — CYANOCOBALAMIN 1000 MCG/ML IJ SOLN
1000.0000 ug | Freq: Once | INTRAMUSCULAR | Status: AC
Start: 2023-11-13 — End: 2023-11-13
  Administered 2023-11-13: 1000 ug via INTRAMUSCULAR

## 2023-11-16 ENCOUNTER — Other Ambulatory Visit: Payer: Self-pay

## 2023-11-17 ENCOUNTER — Other Ambulatory Visit: Payer: Self-pay

## 2023-11-19 ENCOUNTER — Encounter: Payer: Self-pay | Admitting: Family Medicine

## 2023-11-19 ENCOUNTER — Other Ambulatory Visit: Payer: Self-pay

## 2023-11-20 ENCOUNTER — Other Ambulatory Visit: Payer: Self-pay

## 2023-11-23 ENCOUNTER — Other Ambulatory Visit: Payer: Self-pay

## 2023-11-24 ENCOUNTER — Other Ambulatory Visit: Payer: Self-pay

## 2023-11-24 ENCOUNTER — Other Ambulatory Visit (HOSPITAL_COMMUNITY): Payer: Self-pay

## 2023-11-24 ENCOUNTER — Ambulatory Visit (INDEPENDENT_AMBULATORY_CARE_PROVIDER_SITE_OTHER): Payer: 59

## 2023-11-24 DIAGNOSIS — E538 Deficiency of other specified B group vitamins: Secondary | ICD-10-CM

## 2023-11-24 MED ORDER — CYANOCOBALAMIN 1000 MCG/ML IJ SOLN
1000.0000 ug | Freq: Once | INTRAMUSCULAR | Status: AC
Start: 2023-11-24 — End: 2023-11-24
  Administered 2023-11-24: 1000 ug via INTRAMUSCULAR

## 2023-11-27 ENCOUNTER — Ambulatory Visit: Payer: 59

## 2023-12-07 ENCOUNTER — Other Ambulatory Visit: Payer: Self-pay

## 2023-12-09 ENCOUNTER — Other Ambulatory Visit: Payer: Self-pay

## 2023-12-11 ENCOUNTER — Ambulatory Visit (INDEPENDENT_AMBULATORY_CARE_PROVIDER_SITE_OTHER): Payer: 59

## 2023-12-11 DIAGNOSIS — E538 Deficiency of other specified B group vitamins: Secondary | ICD-10-CM | POA: Diagnosis not present

## 2023-12-11 MED ORDER — CYANOCOBALAMIN 1000 MCG/ML IJ SOLN
1000.0000 ug | Freq: Once | INTRAMUSCULAR | Status: AC
Start: 2023-12-11 — End: 2023-12-11
  Administered 2023-12-11: 1000 ug via INTRAMUSCULAR

## 2023-12-24 ENCOUNTER — Other Ambulatory Visit: Payer: Self-pay

## 2023-12-25 ENCOUNTER — Other Ambulatory Visit: Payer: Self-pay

## 2023-12-25 ENCOUNTER — Ambulatory Visit (INDEPENDENT_AMBULATORY_CARE_PROVIDER_SITE_OTHER): Payer: 59

## 2023-12-25 ENCOUNTER — Encounter: Payer: Self-pay | Admitting: Family Medicine

## 2023-12-25 DIAGNOSIS — E538 Deficiency of other specified B group vitamins: Secondary | ICD-10-CM | POA: Diagnosis not present

## 2023-12-25 MED ORDER — CYANOCOBALAMIN 1000 MCG/ML IJ SOLN
1000.0000 ug | Freq: Once | INTRAMUSCULAR | Status: AC
Start: 2023-12-25 — End: 2023-12-25
  Administered 2023-12-25: 1000 ug via INTRAMUSCULAR

## 2023-12-29 ENCOUNTER — Encounter: Payer: 59 | Admitting: Family Medicine

## 2024-01-01 ENCOUNTER — Encounter: Payer: Self-pay | Admitting: Family Medicine

## 2024-01-01 ENCOUNTER — Other Ambulatory Visit: Payer: Self-pay | Admitting: Family Medicine

## 2024-01-01 ENCOUNTER — Other Ambulatory Visit: Payer: Self-pay

## 2024-01-01 ENCOUNTER — Ambulatory Visit (INDEPENDENT_AMBULATORY_CARE_PROVIDER_SITE_OTHER): Payer: 59 | Admitting: Family Medicine

## 2024-01-01 VITALS — BP 128/74 | HR 57 | Temp 97.7°F | Resp 16 | Ht 69.0 in | Wt 214.3 lb

## 2024-01-01 DIAGNOSIS — E538 Deficiency of other specified B group vitamins: Secondary | ICD-10-CM

## 2024-01-01 DIAGNOSIS — Z0000 Encounter for general adult medical examination without abnormal findings: Secondary | ICD-10-CM | POA: Diagnosis not present

## 2024-01-01 MED ORDER — CYANOCOBALAMIN 1000 MCG/ML IJ SOLN
1000.0000 ug | INTRAMUSCULAR | 5 refills | Status: AC
  Filled 2024-01-01: qty 2, 28d supply, fill #0
  Filled 2024-01-28: qty 2, 28d supply, fill #1
  Filled 2024-02-25: qty 2, 28d supply, fill #2
  Filled 2024-03-27: qty 2, 28d supply, fill #3
  Filled 2024-04-29: qty 4, 56d supply, fill #4

## 2024-01-01 NOTE — Progress Notes (Signed)
 Name: Oscar Allen   MRN: [id number]    DOB: [birth year]   Date:01/01/2024       Progress Note  Subjective  Chief Complaint  Chief Complaint  Patient presents with   Annual Exam    HPI  Patient presents for annual CPE .  Diet: not as consistent over the holidays, they still prep their meals during regular work schedule Exercise: still going to [employer] three times a week also works in his yard  Last Dental Exam: up to date  Last Eye Exam: up to date   Depression: phq 9 is negative    01/01/2024    1:13 PM 10/09/2023    3:11 PM 05/15/2023   10:57 AM 02/13/2023   11:18 AM 12/26/2022    2:02 PM  Depression screen PHQ 2/9  Decreased Interest 0 0 0 0 0  Down, Depressed, Hopeless 0 0 0 0 0  PHQ - 2 Score 0 0 0 0 0  Altered sleeping 0 0 0 0 0  Tired, decreased energy 0 0 0 0 0  Change in appetite 0 0 0 0 0  Feeling bad or failure about yourself  0 0 0 0 0  Trouble concentrating 0 0 0 0 0  Moving slowly or fidgety/restless 0 0 0 0 0  Suicidal thoughts 0 0 0 0 0  PHQ-9 Score 0 0 0 0 0  Difficult doing work/chores Not difficult at all        Hypertension:  BP Readings from Last 3 Encounters:  01/01/24 128/74  10/09/23 128/76  05/15/23 126/70    Obesity: Wt Readings from Last 3 Encounters:  01/01/24 214 lb 4.8 oz (97.2 kg)  10/09/23 205 lb (93 kg)  05/15/23 203 lb (92.1 kg)   BMI Readings from Last 3 Encounters:  01/01/24 31.65 kg/m  10/09/23 30.27 kg/m  05/15/23 29.13 kg/m     US  Adult Drinking Patterns and Maximum Drinking limits  which include:  Healthy women and men over age 38 - no more than 3 drinks in a day and no more than 7 in a week The patient should know the standard drink size:  12 ounces of regular beer (about 5% alcohol) 5 ounces of wine (about 12% alcohol) 1.5 ounces of distilled spirits (about 40% alcohol)    His score is 3  Married STD testing and prevention (HIV/chl/gon/syphilis):  N/A Sexual history: one partner, no  problems  Hep C Screening: completed Skin cancer: Discussed monitoring for atypical lesions Colorectal cancer: N/A Prostate cancer:  not applicable   Lung cancer:  Low Dose CT Chest recommended if Age 79-80 years, 30 pack-year currently smoking OR have quit w/in 15years. Patient  is not a candidate for screening   AAA: The USPSTF recommends one-time screening with ultrasonography in men ages 17 to 75 years who have ever smoked. Patient   is not a candidate for screening  ECG: 2023  Vaccines:  RSV: not applicable HPV: completed Tdap: completed Shingrix: not applicable Pneumonia: not applicable Flu: completed COVID-19: completed  Advanced Care Planning: A voluntary discussion about advance care planning including the explanation and discussion of advance directives.  Discussed health care proxy and Living will, and the patient was able to identify a health care proxy as wife .  Patient does not have a living will and power of attorney of health care   Patient Active Problem List   Diagnosis Date Noted   Family history of rheumatoid arthritis 07/28/2018   Hyperuricemia 07/28/2018  Vitreous floater, bilateral 05/21/2018   Dry eye syndrome of both lacrimal glands 05/21/2018   Peroneal tendinitis 02/12/2018   Overweight (BMI 25.0-29.9) 09/18/2017   Bone spur of left ankle 03/28/2016   Insomnia, persistent 08/26/2015   History of hypertension 08/26/2015   Adult hypothyroidism 08/26/2015   B12 deficiency 08/26/2015   Gout of big toe 08/26/2015   Vitamin D  deficiency 08/26/2015    Past Surgical History:  Procedure Laterality Date   EYE SURGERY Bilateral 2012   Laser Eye Surgery   FOOT SURGERY Right 2000   LASIK Bilateral 2012   PRK OU   MANDIBLE FRACTURE SURGERY  2001   RETINAL DETACHMENT SURGERY Right 2012   Laser for repair of non-traumatic RD   RHINOPLASTY  2013   VASECTOMY      Family History  Problem Relation Age of Onset   Anxiety disorder Mother    Rheum  arthritis Mother    Arthritis Mother    Hypothyroidism Father    Heart Problems Brother    Psoriasis Brother    Rheum arthritis Maternal Grandmother    Pancreatic cancer Maternal Grandfather    Liver cancer Paternal Grandfather    Glaucoma Paternal Grandfather     Social History   Socioeconomic History   Marital status: Married    Spouse name: Magazine Features Editor   Number of children: 0   Years of education: Not on file   Highest education level: Professional school degree (e.g., MD, DDS, DVM, JD)  Occupational History   Occupation: PT  Tobacco Use   Smoking status: Never   Smokeless tobacco: Never   Tobacco comments:    I don't smoke  Vaping Use   Vaping status: Never Used  Substance and Sexual Activity   Alcohol use: Yes    Alcohol/week: 2.0 - 3.0 standard drinks of alcohol    Types: 2 - 3 Standard drinks or equivalent per week    Comment: OCCASIONALLY   Drug use: No   Sexual activity: Yes    Partners: Female    Birth control/protection: Surgical  Other Topics Concern   Not on file  Social History Narrative   Not on file   Social Drivers of Health   Financial Resource Strain: Low Risk  (01/01/2024)   Overall Financial Resource Strain (CARDIA)    Difficulty of Paying Living Expenses: Not hard at all  Food Insecurity: No Food Insecurity (01/01/2024)   Hunger Vital Sign    Worried About Running Out of Food in the Last Year: Never true    Ran Out of Food in the Last Year: Never true  Transportation Needs: No Transportation Needs (01/01/2024)   PRAPARE - Administrator, Civil Service (Medical): No    Lack of Transportation (Non-Medical): No  Physical Activity: Sufficiently Active (01/01/2024)   Exercise Vital Sign    Days of Exercise per Week: 5 days    Minutes of Exercise per Session: 60 min  Stress: No Stress Concern Present (01/01/2024)   [employer] of Occupational Health - Occupational Stress Questionnaire    Feeling of Stress : Only a little  Social  Connections: Moderately Integrated (01/01/2024)   Social Connection and Isolation Panel [NHANES]    Frequency of Communication with Friends and Family: More than three times a week    Frequency of Social Gatherings with Friends and Family: More than three times a week    Attends Religious Services: Never    Database Administrator or Organizations: Yes  Attends Banker Meetings: Never    Marital Status: Married  Catering Manager Violence: Not At Risk (01/01/2024)   Humiliation, Afraid, Rape, and Kick questionnaire    Fear of Current or Ex-Partner: No    Emotionally Abused: No    Physically Abused: No    Sexually Abused: No     Current Outpatient Medications:    Acetaminophen  325 MG CAPS, Take 2 capsules by mouth as needed., Disp: , Rfl:    allopurinol  (ZYLOPRIM ) 100 MG tablet, Take 2.5 tablets (250 mg total) by mouth daily., Disp: 225 tablet, Rfl: 1   colchicine  0.6 MG tablet, Take 2 tablets (1.2 mg total) by mouth daily as needed (and repat in one hour if no resolution of pain)., Disp: 30 tablet, Rfl: 0   ibuprofen (ADVIL) 200 MG tablet, Take 2 tablets by mouth as needed., Disp: , Rfl:    levothyroxine  (SYNTHROID ) 75 MCG tablet, Take 1 tablet (75 mcg total) by mouth daily., Disp: 90 tablet, Rfl: 1   Phentermine -Topiramate  (QSYMIA ) 11.25-69 MG CP24, Take 1 capsule by mouth every morning., Disp: 30 capsule, Rfl: 2   triamcinolone  cream (KENALOG ) 0.1 %, Apply 1 Application topically 2 (two) times daily., Disp: 454 g, Rfl: 0   Vitamin D , Ergocalciferol , (DRISDOL ) 1.25 MG (50000 UNIT) CAPS capsule, Take 1 capsule (50,000 Units total) by mouth every 7 (seven) days., Disp: 12 capsule, Rfl: 1  Allergies  Allergen Reactions   Rhus Coriaria [[place]] Hives     ROS  Constitutional: Negative for fever, positive for weight change.  Respiratory: Negative for cough and shortness of breath.   Cardiovascular: Negative for chest pain or palpitations.  Gastrointestinal: Negative for  abdominal pain, no bowel changes.  Musculoskeletal: Negative for gait problem or joint swelling.  Skin: Negative for rash.  Neurological: Negative for dizziness or headache.  No other specific complaints in a complete review of systems (except as listed in HPI above).   Objective  Vitals:   01/01/24 1319  BP: 128/74  Pulse: (!) 57  Resp: 16  Temp: 97.7 F (36.5 C)  TempSrc: Oral  SpO2: 99%  Weight: 214 lb 4.8 oz (97.2 kg)  Height: 5' 9 (1.753 m)    Body mass index is 31.65 kg/m.  Physical Exam  Constitutional: Patient appears well-developed and well-nourished. No distress.  HENT: Head: Normocephalic and atraumatic. Ears: B TMs ok, no erythema or effusion; Nose: Nose normal. Mouth/Throat: Oropharynx is clear and moist. No oropharyngeal exudate.  Eyes: Conjunctivae and EOM are normal. Pupils are equal, round, and reactive to light. No scleral icterus.  Neck: Normal range of motion. Neck supple. No JVD present. No thyromegaly present.  Cardiovascular: Normal rate, regular rhythm and normal heart sounds.  No murmur heard. No BLE edema. Pulmonary/Chest: Effort normal and breath sounds normal. No respiratory distress. Abdominal: Soft. Bowel sounds are normal, no distension. There is no tenderness. no masses MALE GENITALIA: Normal descended testes bilaterally, no masses palpated, no hernias, no lesions, no discharge RECTAL: not done  Musculoskeletal: Normal range of motion, no joint effusions. No gross deformities Neurological: he is alert and oriented to person, place, and time. No cranial nerve deficit. Coordination, balance, strength, speech and gait are normal.  Skin: Skin is warm and dry. No rash noted. No erythema. Atypical lesions Psychiatric: Patient has a normal mood and affect. behavior is normal. Judgment and thought content normal.     Assessment & Plan  1. Well adult exam (Primary)     -Prostate cancer screening and  PSA options (with potential risks and benefits  of testing vs not testing) were discussed along with recent recs/guidelines. -USPSTF grade A and B recommendations reviewed with patient; age-appropriate recommendations, preventive care, screening tests, etc discussed and encouraged; healthy living encouraged; see AVS for patient education given to patient -Discussed importance of 150 minutes of physical activity weekly, eat two servings of fish weekly, eat one serving of tree nuts ( cashews, pistachios, pecans, almonds.SABRA) every other day, eat 6 servings of fruit/vegetables daily and drink plenty of water and avoid sweet beverages.  -Reviewed Health Maintenance: yes

## 2024-01-04 ENCOUNTER — Other Ambulatory Visit: Payer: Self-pay

## 2024-01-04 ENCOUNTER — Other Ambulatory Visit: Payer: Self-pay | Admitting: Family Medicine

## 2024-01-04 MED ORDER — QSYMIA 11.25-69 MG PO CP24
1.0000 | ORAL_CAPSULE | Freq: Every day | ORAL | 2 refills | Status: DC
  Filled 2024-01-04: qty 30, 30d supply, fill #0

## 2024-01-05 ENCOUNTER — Other Ambulatory Visit: Payer: Self-pay

## 2024-01-05 ENCOUNTER — Other Ambulatory Visit: Payer: Self-pay | Admitting: Family Medicine

## 2024-01-05 MED ORDER — QSYMIA 15-92 MG PO CP24
1.0000 | ORAL_CAPSULE | Freq: Every day | ORAL | 2 refills | Status: DC
  Filled 2024-01-05: qty 30, 30d supply, fill #0
  Filled 2024-01-31 – 2024-02-08 (×2): qty 30, 30d supply, fill #1
  Filled 2024-02-09 (×2): qty 30, 30d supply, fill #0
  Filled 2024-03-07 – 2024-03-11 (×2): qty 30, 30d supply, fill #1

## 2024-01-06 ENCOUNTER — Other Ambulatory Visit: Payer: Self-pay

## 2024-01-07 ENCOUNTER — Other Ambulatory Visit: Payer: Self-pay | Admitting: Family Medicine

## 2024-01-07 ENCOUNTER — Other Ambulatory Visit: Payer: Self-pay

## 2024-01-07 DIAGNOSIS — E039 Hypothyroidism, unspecified: Secondary | ICD-10-CM

## 2024-01-07 MED FILL — Levothyroxine Sodium Tab 75 MCG: ORAL | 90 days supply | Qty: 90 | Fill #0 | Status: AC

## 2024-01-08 ENCOUNTER — Other Ambulatory Visit: Payer: Self-pay

## 2024-01-28 ENCOUNTER — Other Ambulatory Visit: Payer: Self-pay

## 2024-01-31 ENCOUNTER — Other Ambulatory Visit: Payer: Self-pay

## 2024-02-05 ENCOUNTER — Other Ambulatory Visit: Payer: Self-pay

## 2024-02-08 ENCOUNTER — Other Ambulatory Visit: Payer: Self-pay

## 2024-02-09 ENCOUNTER — Other Ambulatory Visit: Payer: Self-pay

## 2024-02-09 ENCOUNTER — Other Ambulatory Visit (HOSPITAL_COMMUNITY): Payer: Self-pay

## 2024-02-10 ENCOUNTER — Other Ambulatory Visit: Payer: Self-pay

## 2024-02-18 ENCOUNTER — Encounter: Payer: No Typology Code available for payment source | Admitting: Dermatology

## 2024-02-18 ENCOUNTER — Encounter: Payer: Commercial Managed Care - PPO | Admitting: Dermatology

## 2024-02-18 ENCOUNTER — Ambulatory Visit (INDEPENDENT_AMBULATORY_CARE_PROVIDER_SITE_OTHER): Payer: Commercial Managed Care - PPO | Admitting: Dermatology

## 2024-02-18 DIAGNOSIS — D225 Melanocytic nevi of trunk: Secondary | ICD-10-CM

## 2024-02-18 DIAGNOSIS — L578 Other skin changes due to chronic exposure to nonionizing radiation: Secondary | ICD-10-CM

## 2024-02-18 DIAGNOSIS — Z1283 Encounter for screening for malignant neoplasm of skin: Secondary | ICD-10-CM | POA: Diagnosis not present

## 2024-02-18 DIAGNOSIS — D229 Melanocytic nevi, unspecified: Secondary | ICD-10-CM | POA: Diagnosis not present

## 2024-02-18 DIAGNOSIS — L814 Other melanin hyperpigmentation: Secondary | ICD-10-CM

## 2024-02-18 DIAGNOSIS — L811 Chloasma: Secondary | ICD-10-CM

## 2024-02-18 DIAGNOSIS — L649 Androgenic alopecia, unspecified: Secondary | ICD-10-CM | POA: Diagnosis not present

## 2024-02-18 DIAGNOSIS — Z808 Family history of malignant neoplasm of other organs or systems: Secondary | ICD-10-CM

## 2024-02-18 DIAGNOSIS — L821 Other seborrheic keratosis: Secondary | ICD-10-CM | POA: Diagnosis not present

## 2024-02-18 DIAGNOSIS — Z79899 Other long term (current) drug therapy: Secondary | ICD-10-CM

## 2024-02-18 DIAGNOSIS — W908XXA Exposure to other nonionizing radiation, initial encounter: Secondary | ICD-10-CM

## 2024-02-18 DIAGNOSIS — Z7189 Other specified counseling: Secondary | ICD-10-CM

## 2024-02-18 NOTE — Patient Instructions (Addendum)
Melasma is a chronic; persistent condition of hyperpigmented patches generally on the face, worse in summer due to higher UV exposure.    Heredity; thyroid disease; sun exposure; pregnancy; birth control pills; epilepsy medication and darker skin may predispose to Melasma.   Recommendations include: - Sun avoidance and daily broad spectrum (UVA/UVB) sunscreen SPF 30+, preferably with Zinc or Titanium Dioxide. - Rx topical bleaching creams (i.e. hydroquinone) is a common treatment but should not be used long term.  Hydroquinones may be mixed with retinoids; steroids; Kojic Acid. - Rx Azelaic Acid is also a treatment option that is safe for pregnancy (Category B). - OTC Heliocare can be helpful in control and prevention. - Oral Rx with Tranexamic Acid 250 mg - 650 mg po daily can be used for moderate to severe cases especially during summer (contraindications include pregnancy; lactation; hx of PE; DVT; clotting disorder; heart disease; anticoagulant use and upcoming long trips)   - Chemical peels (would need multiple for best result).  - Lasers and  Microdermabrasion may also be helpful adjunct treatments.  Recommend taking Heliocare sun protection supplement daily in sunny weather for additional sun protection. For maximum protection on the sunniest days, you can take up to 2 capsules of regular Heliocare OR take 1 capsule of Heliocare Ultra. For prolonged exposure (such as a full day in the sun), you can repeat your dose of the supplement 4 hours after your first dose. Heliocare can be purchased at [employer], at some [facility] or at [email or web address].    Melanoma ABCDEs  Melanoma is the most dangerous type of skin cancer, and is the leading cause of death from skin disease.  You are more likely to develop melanoma if you: Have light-colored skin, light-colored eyes, or red or blond hair Spend a lot of time in the sun Tan regularly, either outdoors or in a tanning bed Have had  blistering sunburns, especially during childhood Have a close family member who has had a melanoma Have atypical moles or large birthmarks  Early detection of melanoma is key since treatment is typically straightforward and cure rates are extremely high if we catch it early.   The first sign of melanoma is often a change in a mole or a new dark spot.  The ABCDE system is a way of remembering the signs of melanoma.  A for asymmetry:  The two halves do not match. B for border:  The edges of the growth are irregular. C for color:  A mixture of colors are present instead of an even brown color. D for diameter:  Melanomas are usually (but not always) greater than 6mm - the size of a pencil eraser. E for evolution:  The spot keeps changing in size, shape, and color.  Please check your skin once per month between visits. You can use a small mirror in front and a large mirror behind you to keep an eye on the back side or your body.   If you see any new or changing lesions before your next follow-up, please call to schedule a visit.  Please continue daily skin protection including broad spectrum sunscreen SPF 30+ to sun-exposed areas, reapplying every 2 hours as needed when you're outdoors.    Due to recent changes in healthcare laws, you may see results of your pathology and/or laboratory studies on MyChart before the doctors have had a chance to review them. We understand that in some cases there may be results that are confusing or concerning to you.  Please understand that not all results are received at the same time and often the doctors may need to interpret multiple results in order to provide you with the best plan of care or course of treatment. Therefore, we ask that you please give [place] 2 business days to thoroughly review all your results before contacting the office for clarification. Should we see a critical lab result, you will be contacted sooner.   If You Need Anything After Your  Visit  If you have any questions or concerns for your doctor, please call our main line at [phone] and press option 4 to reach your doctor's medical assistant. If no one answers, please leave a voicemail as directed and we will return your call as soon as possible. Messages left after 4 pm will be answered the following business day.   You may also send [place] a message via MyChart. We typically respond to MyChart messages within 1-2 business days.  For prescription refills, please ask your pharmacy to contact our office. Our fax number is [phone].  If you have an urgent issue when the clinic is closed that cannot wait until the next business day, you can page your doctor at the number below.    Please note that while we do our best to be available for urgent issues outside of office hours, we are not available 24/7.   If you have an urgent issue and are unable to reach [place], you may choose to seek medical care at your doctor's office, retail clinic, urgent care center, or emergency room.  If you have a medical emergency, please immediately call 911 or go to the emergency department.  Pager Numbers  - Dr. Gwen Pounds: [phone]  - Dr. Roseanne Reno: [phone]  - Dr. Katrinka Blazing: [phone]   In the event of inclement weather, please call our main line at [phone] for an update on the status of any delays or closures.  Dermatology Medication Tips: Please keep the boxes that topical medications come in in order to help keep track of the instructions about where and how to use these. Pharmacies typically print the medication instructions only on the boxes and not directly on the medication tubes.   If your medication is too expensive, please contact our office at [phone] option 4 or send [place] a message through MyChart.   We are unable to tell what your co-pay for medications will be in advance as this is different depending on your insurance coverage. However, we may be able to find a  substitute medication at lower cost or fill out paperwork to get insurance to cover a needed medication.   If a prior authorization is required to get your medication covered by your insurance company, please allow [place] 1-2 business days to complete this process.  Drug prices often vary depending on where the prescription is filled and some pharmacies may offer cheaper prices.  The website www.goodrx.com contains coupons for medications through different pharmacies. The prices here do not account for what the cost may be with help from insurance (it may be cheaper with your insurance), but the website can give you the price if you did not use any insurance.  - You can print the associated coupon and take it with your prescription to the pharmacy.  - You may also stop by our office during regular business hours and pick up a GoodRx coupon card.  - If you need your prescription sent electronically to a different pharmacy, notify our office through [facility]  MyChart or by phone at [phone] option 4.     Si Usted Necesita Algo Despus de Su Visita  Tambin puede enviarnos un mensaje a travs de Clinical cytogeneticist. Por lo general respondemos a los mensajes de MyChart en el transcurso de 1 a 2 das hbiles.  Para renovar recetas, por favor pida a su farmacia que se ponga en contacto con nuestra oficina. Annie Sable de fax es [place] [phone].  Si tiene un asunto urgente cuando la clnica est cerrada y que no puede esperar hasta el siguiente da hbil, puede llamar/localizar a su doctor(a) al nmero que aparece a continuacin.   Por favor, tenga en cuenta que aunque hacemos todo lo posible para estar disponibles para asuntos urgentes fuera del horario de [place], no estamos disponibles las 24 horas del da, los 7 [address]  [address] de la [place].   Si tiene un problema urgente y no puede comunicarse con nosotros, puede optar por buscar atencin mdica  en el consultorio de su doctor(a), en una clnica privada, en un  centro de atencin urgente o en una sala de emergencias.  Si tiene Engineer, drilling, por favor llame inmediatamente al 911 o vaya a la sala de emergencias.  Nmeros de bper  - Dr. Gwen Pounds: [phone]  - Dra. Roseanne Reno: [phone]  - Dr. Katrinka Blazing: [phone]   En caso de inclemencias del tiempo, por favor llame a Lacy Duverney principal al [phone] para una actualizacin sobre el [place] de cualquier retraso o cierre.  Consejos para la medicacin en dermatologa: Por favor, guarde las cajas en las que vienen los medicamentos de uso tpico para ayudarle a seguir las instrucciones sobre dnde y cmo usarlos. Las farmacias generalmente imprimen las instrucciones del medicamento slo en las cajas y no directamente en los tubos del [place].   Si su medicamento es muy caro, por favor, pngase en contacto con Rolm [place]ala llamando al [phone] y presione la opcin 4 o envenos un mensaje a travs de Clinical cytogeneticist.   No podemos decirle cul ser su copago por los medicamentos por adelantado ya que esto es diferente dependiendo de la cobertura de su seguro. Sin embargo, es posible que podamos encontrar un medicamento sustituto a Audiological scientist un formulario para que el seguro cubra el medicamento que se considera necesario.   Si se requiere una autorizacin previa para que su compaa de seguros [place] su medicamento, por favor permtanos de 1 a 2 das hbiles para completar [address]  Los precios de los medicamentos varan con frecuencia dependiendo del Environmental consultant de dnde se surte la receta y alguna farmacias pueden ofrecer precios ms baratos.  El sitio web www.goodrx.com tiene cupones para medicamentos de Health and safety inspector. Los precios aqu no tienen en cuenta lo que podra costar con la ayuda del seguro (puede ser ms barato con su seguro), pero el sitio web puede darle el precio si no utiliz Tourist information centre manager.  - Puede imprimir el cupn correspondiente y llevarlo con su  receta a la farmacia.  - Tambin puede pasar por nuestra oficina durante el horario de atencin regular y Education officer, museum una tarjeta de cupones de GoodRx.  - Si necesita que su receta se enve electrnicamente a una farmacia diferente, informe a nuestra oficina a travs de MyChart de [place] o por telfono llamando al [phone] y presione la opcin 4.

## 2024-02-18 NOTE — Progress Notes (Signed)
 Follow-Up Visit   Subjective  Oscar Allen is a 38 y.o. male who presents for the following: Skin Cancer Screening and Full Body Skin Exam  The patient presents for Total-Body Skin Exam (TBSE) for skin cancer screening and mole check. The patient has spots, moles and lesions to be evaluated, some may be new or changing and the patient may have concern these could be cancer.  Patient asks about melasma at nose and under eyes. He does use moisturizer with SPF. Patient advises there is a family hx skin cancer but he is unsure what kind, possibly melanoma with paternal grandmother.    The following portions of the chart were reviewed this encounter and updated as appropriate: medications, allergies, medical history  Review of Systems:  No other skin or systemic complaints except as noted in HPI or Assessment and Plan.  Objective  Well appearing patient in no apparent distress; mood and affect are within normal limits.  A full examination was performed including scalp, head, eyes, ears, nose, lips, neck, chest, axillae, abdomen, back, buttocks, bilateral upper extremities, bilateral lower extremities, hands, feet, fingers, toes, fingernails, and toenails. All findings within normal limits unless otherwise noted below.   Relevant physical exam findings are noted in the Assessment and Plan.    Assessment & Plan   SKIN CANCER SCREENING PERFORMED TODAY.  ACTINIC DAMAGE - Chronic condition, secondary to cumulative UV/sun exposure - diffuse scaly erythematous macules with underlying dyspigmentation - Recommend daily broad spectrum sunscreen SPF 30+ to sun-exposed areas, reapply every 2 hours as needed.  - Staying in the shade or wearing long sleeves, sun glasses (UVA+UVB protection) and wide brim hats (4-inch brim around the entire circumference of the hat) are also recommended for sun protection.  - Call for new or changing lesions.  LENTIGINES, SEBORRHEIC KERATOSES, HEMANGIOMAS -  Benign normal skin lesions - Benign-appearing - Call for any changes  MELANOCYTIC NEVI - Tan-brown and/or pink-flesh-colored symmetric macules and papules - Benign appearing on exam today - Observation - Call clinic for new or changing moles - Recommend daily use of broad spectrum spf 30+ sunscreen to sun-exposed areas.  - 0.6 cm slightly irregular nevi at upper back x 2 - 0.7 cm slightly irregular nevus at left of midline epigastric        MELASMA Exam: reticulated hyperpigmented patches at face Chronic and persistent condition with duration or expected duration over one year. Condition is symptomatic / bothersome to patient. Not to goal. Melasma is a chronic; persistent condition of hyperpigmented patches generally on the face, worse in summer due to higher UV exposure.    Heredity; thyroid disease; sun exposure; pregnancy; birth control pills; epilepsy medication and darker skin may predispose to Melasma.   Recommendations include: - Sun avoidance and daily broad spectrum (UVA/UVB) tinted mineral sunscreen SPF 30+, with Zinc or Titanium Dioxide. - Rx topical bleaching creams (i.e. hydroquinone) is a common treatment but should not be used long term.  Hydroquinones may be mixed with retinoids; vitamin C; steroids; Kojic Acid. - Alastin A-luminate, retinoids, vitamin C, topical tranexamic acid, glycolic acid and kojic acid can be used for brightening while on break from hydroquinone - Rx Azelaic Acid is also a treatment option that is safe for pregnancy (Category B). - OTC Heliocare can be helpful in control and prevention. - Oral Rx with Tranexamic Acid 250 mg - 650 mg po daily can be used for moderate to severe cases especially during summer (contraindications include pregnancy; lactation; hx of PE;  hx of DVT; clotting disorder; heart disease; anticoagulant use and upcoming long trips)   - Chemical peels (would need multiple for best result).  - Lasers and  Microdermabrasion may  also be helpful adjunct treatments.  Treatment Plan: Recommend daily sunscreen and OTC Heliocare with sun exposure.   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 Condition is symptomatic / bothersome to patient. Not to goal.  Androgenetic Alopecia (or Male pattern hair loss) refers to the common patterned hair loss affecting many men.  Male pattern alopecia is mediated by dihydrotestosterone which induces miniaturization of androgen-sensitive hair follicles.  It is chronic and persistent, but treatable; not curable. Topical treatment includes: - 5% topical Minoxidil Oral treatment includes: - Finasteride 1 mg qd - Minoxidil 1.25 - 5 mg qd - Dutasteride 0.5 mg qd Adjunct therapy includes: - Low Level Laser Light Therapy (LLLT) - Platelet-rich Plasma injections (PRP) - Hair Transplantation or scalp reduction  Treatment Plan: No hx heart problems. Recommend Minoxidil 2.5 mg every day #90 1RF  Patient defers treatment, photos today.  Doses of oral minoxidil for hair loss are considered 'low dose'. This is because the doses used for hair loss are much lower than the doses which are used for conditions such as high blood pressure (hypertension). The doses used for hypertension are 10-40mg  per day.  Side effects are uncommon at the low doses (up to 2.5 mg/day) used to treat hair loss. Potential side effects, more commonly seen at higher doses, include: Increase in hair growth (hypertrichosis) elsewhere on face and body Temporary hair shedding upon starting medication which may last up to 4 weeks Ankle swelling, fluid retention, rapid weight gain more than 5 pounds Low blood pressure and feeling lightheaded or dizzy when standing up quickly Fast or irregular heartbeat Headaches  Long term medication management.  Patient is  using long term (months to years) prescription medication  to control their dermatologic condition.  These medications require periodic monitoring to evaluate for efficacy and side effects and may require periodic laboratory monitoring.    Return in about 2 years (around 02/17/2026) for TBSE, Fhx MM, recheck nevus.  Anise Salvo, RMA, am acting as scribe for Armida Sans, MD .   Documentation: I have reviewed the above documentation for accuracy and completeness, and I agree with the above.  Armida Sans, MD

## 2024-02-22 ENCOUNTER — Encounter: Payer: Self-pay | Admitting: Dermatology

## 2024-03-07 ENCOUNTER — Other Ambulatory Visit: Payer: Self-pay

## 2024-03-08 ENCOUNTER — Telehealth: Payer: Self-pay

## 2024-03-08 NOTE — Telephone Encounter (Signed)
 Prior Auth on   Phentermine-Topiramate (QSYMIA) 15-92 MG   HKV:[id number]

## 2024-03-08 NOTE — Telephone Encounter (Signed)
 PA done waiting for insurance to determine

## 2024-03-11 ENCOUNTER — Other Ambulatory Visit: Payer: Self-pay

## 2024-03-11 ENCOUNTER — Other Ambulatory Visit (HOSPITAL_COMMUNITY): Payer: Self-pay

## 2024-03-22 ENCOUNTER — Other Ambulatory Visit: Payer: Self-pay

## 2024-03-27 ENCOUNTER — Other Ambulatory Visit: Payer: Self-pay | Admitting: Family Medicine

## 2024-03-27 DIAGNOSIS — E039 Hypothyroidism, unspecified: Secondary | ICD-10-CM

## 2024-03-28 ENCOUNTER — Other Ambulatory Visit: Payer: Self-pay

## 2024-04-01 ENCOUNTER — Ambulatory Visit: Payer: Self-pay | Admitting: Family Medicine

## 2024-04-06 ENCOUNTER — Other Ambulatory Visit: Payer: Self-pay

## 2024-04-11 ENCOUNTER — Other Ambulatory Visit: Payer: Self-pay | Admitting: Family Medicine

## 2024-04-11 DIAGNOSIS — E039 Hypothyroidism, unspecified: Secondary | ICD-10-CM

## 2024-04-12 ENCOUNTER — Other Ambulatory Visit: Payer: Self-pay

## 2024-04-19 ENCOUNTER — Other Ambulatory Visit: Payer: Self-pay | Admitting: Family Medicine

## 2024-04-19 ENCOUNTER — Other Ambulatory Visit: Payer: Self-pay

## 2024-04-19 DIAGNOSIS — E039 Hypothyroidism, unspecified: Secondary | ICD-10-CM

## 2024-04-21 ENCOUNTER — Encounter: Payer: Self-pay | Admitting: Family Medicine

## 2024-04-21 ENCOUNTER — Other Ambulatory Visit: Payer: Self-pay

## 2024-04-22 ENCOUNTER — Other Ambulatory Visit: Payer: Self-pay

## 2024-04-22 ENCOUNTER — Other Ambulatory Visit: Payer: Self-pay | Admitting: Family Medicine

## 2024-04-22 DIAGNOSIS — E039 Hypothyroidism, unspecified: Secondary | ICD-10-CM

## 2024-04-22 MED ORDER — LEVOTHYROXINE SODIUM 75 MCG PO TABS
75.0000 ug | ORAL_TABLET | Freq: Every day | ORAL | 1 refills | Status: DC
  Filled 2024-04-22: qty 90, 90d supply, fill #0
  Filled 2024-07-17: qty 90, 90d supply, fill #1

## 2024-04-29 ENCOUNTER — Other Ambulatory Visit: Payer: Self-pay

## 2024-05-05 ENCOUNTER — Other Ambulatory Visit: Payer: Self-pay

## 2024-06-23 ENCOUNTER — Encounter: Payer: Self-pay | Admitting: Family Medicine

## 2024-06-23 ENCOUNTER — Other Ambulatory Visit: Payer: Self-pay | Admitting: Family Medicine

## 2024-06-23 ENCOUNTER — Other Ambulatory Visit: Payer: Self-pay

## 2024-06-23 DIAGNOSIS — E559 Vitamin D deficiency, unspecified: Secondary | ICD-10-CM

## 2024-06-24 ENCOUNTER — Ambulatory Visit: Admitting: Family Medicine

## 2024-06-24 ENCOUNTER — Encounter: Payer: Self-pay | Admitting: Family Medicine

## 2024-06-24 ENCOUNTER — Other Ambulatory Visit: Payer: Self-pay

## 2024-06-24 VITALS — BP 128/70 | HR 60 | Resp 16 | Ht 69.0 in | Wt 218.0 lb

## 2024-06-24 DIAGNOSIS — L308 Other specified dermatitis: Secondary | ICD-10-CM

## 2024-06-24 DIAGNOSIS — R21 Rash and other nonspecific skin eruption: Secondary | ICD-10-CM | POA: Diagnosis not present

## 2024-06-24 MED ORDER — PREDNISONE 10 MG PO TABS
ORAL_TABLET | ORAL | 0 refills | Status: AC
  Filled 2024-06-24: qty 42, 12d supply, fill #0

## 2024-06-24 MED ORDER — LEVOCETIRIZINE DIHYDROCHLORIDE 5 MG PO TABS
5.0000 mg | ORAL_TABLET | Freq: Every evening | ORAL | 1 refills | Status: AC
Start: 2024-06-24 — End: ?
  Filled 2024-06-24: qty 90, 90d supply, fill #0

## 2024-06-24 MED ORDER — DEXAMETHASONE SODIUM PHOSPHATE 10 MG/ML IJ SOLN
10.0000 mg | Freq: Once | INTRAMUSCULAR | Status: AC
  Administered 2024-06-24: 10 mg via INTRAMUSCULAR

## 2024-06-24 MED ORDER — FAMOTIDINE 20 MG PO TABS
20.0000 mg | ORAL_TABLET | Freq: Two times a day (BID) | ORAL | 1 refills | Status: DC | PRN
  Filled 2024-06-24: qty 60, 30d supply, fill #0

## 2024-06-24 NOTE — Progress Notes (Signed)
 Patient ID: Oscar Allen, male    DOB: [birth year], 38 y.o.   MRN: [id number]  PCP: Glenard Mire, MD  Chief Complaint  Patient presents with   Rash    X2-3 days. Using OTC cream. Itching. Side/back of neck, feels like spreading to eye. Has been in the woods a little bit- wondering if poison oak/ivy.    Subjective:   Oscar Allen is a 38 y.o. male, presents to clinic with CC of the following:  HPI  Rash onset 2-3 d ago rapidly spreading and very itchy  He has tried topical steroids (OTC and Rx) and hydroxyzine  No new soaps or detergents, he has been out in the yard a little and is very sensitive to poison oak/ivy, hx of recurrent rashes similar to this He and his wife have also been out exercising and sweating a lot and he wonders if its a heat rash Rash is different than how poison oak has looked in the past, but very itchy like it He's had a fungal rash before  Interested in allergist consult  Patient Active Problem List   Diagnosis Date Noted   Family history of rheumatoid arthritis 07/28/2018   Hyperuricemia 07/28/2018   Vitreous floater, bilateral 05/21/2018   Dry eye syndrome of both lacrimal glands 05/21/2018   Peroneal tendinitis 02/12/2018   Overweight (BMI 25.0-29.9) 09/18/2017   Bone spur of left ankle 03/28/2016   Insomnia, persistent 08/26/2015   History of hypertension 08/26/2015   Adult hypothyroidism 08/26/2015   B12 deficiency 08/26/2015   Gout of big toe 08/26/2015   Vitamin D  deficiency 08/26/2015      Current Outpatient Medications:    Acetaminophen  325 MG CAPS, Take 2 capsules by mouth as needed., Disp: , Rfl:    allopurinol  (ZYLOPRIM ) 100 MG tablet, Take 2.5 tablets (250 mg total) by mouth daily., Disp: 225 tablet, Rfl: 1   colchicine  0.6 MG tablet, Take 2 tablets (1.2 mg total) by mouth daily as needed (and repat in one hour if no resolution of pain)., Disp: 30 tablet, Rfl: 0   cyanocobalamin  (VITAMIN B12) 1000 MCG/ML  injection, Inject 1 mL (1,000 mcg total) into the muscle every 14 (fourteen) days., Disp: 2 mL, Rfl: 5   ibuprofen (ADVIL) 200 MG tablet, Take 2 tablets by mouth as needed., Disp: , Rfl:    levothyroxine  (SYNTHROID ) 75 MCG tablet, Take 1 tablet (75 mcg total) by mouth daily., Disp: 90 tablet, Rfl: 1   triamcinolone  cream (KENALOG ) 0.1 %, Apply 1 Application topically 2 (two) times daily., Disp: 454 g, Rfl: 0   Vitamin D , Ergocalciferol , (DRISDOL ) 1.25 MG (50000 UNIT) CAPS capsule, Take 1 capsule (50,000 Units total) by mouth every 7 (seven) days., Disp: 12 capsule, Rfl: 1   Allergies  Allergen Reactions   Rhus Coriaria [Sumac] Hives     Social History   Tobacco Use   Smoking status: Never   Smokeless tobacco: Never   Tobacco comments:    I don't smoke  Vaping Use   Vaping status: Never Used  Substance Use Topics   Alcohol use: Yes    Alcohol/week: 2.0 - 3.0 standard drinks of alcohol    Types: 2 - 3 Standard drinks or equivalent per week    Comment: OCCASIONALLY   Drug use: No      Chart Review Today: I personally reviewed active problem list, medication list, allergies, family history, social history, health maintenance, notes from last encounter, lab results, imaging with the patient/caregiver today.  Review of Systems  Constitutional: Negative.   HENT: Negative.    Eyes: Negative.   Respiratory: Negative.    Cardiovascular: Negative.   Gastrointestinal: Negative.   Endocrine: Negative.   Genitourinary: Negative.   Musculoskeletal: Negative.   Skin: Negative.   Allergic/Immunologic: Negative.   Neurological: Negative.   Hematological: Negative.   Psychiatric/Behavioral: Negative.    All other systems reviewed and are negative.      Objective:   Vitals:   06/24/24 1002  BP: 128/70  Pulse: 60  Resp: 16  SpO2: 97%  Weight: 218 lb (98.9 kg)  Height: 5' 9 (1.753 m)    Body mass index is 32.19 kg/m.  Physical Exam Vitals and nursing note reviewed.   Constitutional:      General: He is not in acute distress.    Appearance: Normal appearance. He is normal weight. He is not ill-appearing, toxic-appearing or diaphoretic.  HENT:     Head: Normocephalic and atraumatic.     Right Ear: External ear normal.     Left Ear: External ear normal.  Pulmonary:     Effort: No respiratory distress.     Breath sounds: No stridor.   Skin:    Findings: Rash (flat erythematous rash to anterior left neck, single spot to left nare next to eye, more confluent area to lower abd right at belt line) present.   Neurological:     Mental Status: He is alert.      Results for orders placed or performed in visit on 10/09/23  Lipid panel   Collection Time: 10/16/23 11:23 AM  Result Value Ref Range   Cholesterol 200 (H) <200 mg/dL   HDL 62 > OR = 40 mg/dL   Triglycerides 66 <849 mg/dL   LDL Cholesterol (Calc) 122 (H) mg/dL (calc)   Total CHOL/HDL Ratio 3.2 <5.0 (calc)   Non-HDL Cholesterol (Calc) 138 (H) <130 mg/dL (calc)  CBC with Differential/Platelet   Collection Time: 10/16/23 11:23 AM  Result Value Ref Range   WBC 5.0 3.8 - 10.8 Thousand/uL   RBC 4.96 4.20 - 5.80 Million/uL   Hemoglobin 13.4 13.2 - 17.1 g/dL   HCT 58.7 61.4 - 49.9 %   MCV 83.1 80.0 - 100.0 fL   MCH 27.0 27.0 - 33.0 pg   MCHC 32.5 32.0 - 36.0 g/dL   RDW 84.8 (H) 88.9 - 84.9 %   Platelets 270 140 - 400 Thousand/uL   MPV 11.2 7.5 - 12.5 fL   Neutro Abs 2,230 1,500 - 7,800 cells/uL   Absolute Lymphocytes 2,190 850 - 3,900 cells/uL   Absolute Monocytes 350 200 - 950 cells/uL   Eosinophils Absolute 180 15 - 500 cells/uL   Basophils Absolute 50 0 - 200 cells/uL   Neutrophils Relative % 44.6 %   Total Lymphocyte 43.8 %   Monocytes Relative 7.0 %   Eosinophils Relative 3.6 %   Basophils Relative 1.0 %  COMPLETE METABOLIC PANEL WITH GFR   Collection Time: 10/16/23 11:23 AM  Result Value Ref Range   Glucose, Bld 86 65 - 99 mg/dL   BUN 13 7 - 25 mg/dL   Creat 9.10 9.39 - 8.73  mg/dL   eGFR 887 > OR = 60 fO/fpw/8.26f7   BUN/Creatinine Ratio SEE NOTE: 6 - 22 (calc)   Sodium 140 135 - 146 mmol/L   Potassium 4.1 3.5 - 5.3 mmol/L   Chloride 109 98 - 110 mmol/L   CO2 26 20 - 32 mmol/L   Calcium 9.6 8.6 -  10.3 mg/dL   Total Protein 6.6 6.1 - 8.1 g/dL   Albumin 4.6 3.6 - 5.1 g/dL   Globulin 2.0 1.9 - 3.7 g/dL (calc)   AG Ratio 2.3 1.0 - 2.5 (calc)   Total Bilirubin 0.6 0.2 - 1.2 mg/dL   Alkaline phosphatase (APISO) 46 36 - 130 U/L   AST 16 10 - 40 U/L   ALT 21 9 - 46 U/L  TSH   Collection Time: 10/16/23 11:23 AM  Result Value Ref Range   TSH 1.79 0.40 - 4.50 mIU/L  VITAMIN D  25 Hydroxy (Vit-D Deficiency, Fractures)   Collection Time: 10/16/23 11:23 AM  Result Value Ref Range   Vit D, 25-Hydroxy 71 30 - 100 ng/mL  B12 and Folate Panel   Collection Time: 10/16/23 11:23 AM  Result Value Ref Range   Vitamin B-12 300 200 - 1,100 pg/mL   Folate 10.0 ng/mL  Hemoglobin A1c   Collection Time: 10/16/23 11:23 AM  Result Value Ref Range   Hgb A1c MFr Bld 5.2 <5.7 % of total Hgb   Mean Plasma Glucose 103 mg/dL   eAG (mmol/L) 5.7 mmol/L  Uric acid   Collection Time: 10/16/23 11:23 AM  Result Value Ref Range   Uric Acid, Serum 4.9 4.0 - 8.0 mg/dL       Assessment & Plan:   1. Rash and nonspecific skin eruption (Primary) Cause of rash unclear - possibly contact dermatitis/poison oak/ivy - levocetirizine (XYZAL) 5 MG tablet; Take 1 tablet (5 mg total) by mouth every evening.  Dispense: 90 tablet; Refill: 1 - famotidine  (PEPCID ) 20 MG tablet; Take 1 tablet (20 mg total) by mouth 2 (two) times daily as needed (allergic rxn rash).  Dispense: 60 tablet; Refill: 1 - dexamethasone  (DECADRON ) injection 10 mg - predniSONE  (DELTASONE ) 10 MG tablet; 6 tabs poqd 1-2, 5 poqd 3-4.[id number] tab poqd 11-12  Dispense: 42 tablet; Refill: 0 AVS plan for pt: Recommend for the next couple weeks staying on a once daily 2nd generation antihistamine like zyrtec, xyzal (prescribed), or  claritin and while you are very itchy right now you can also add pepcid  twice a day  Hydroxyzine  is still an option for severe itching You may also still use topical steroids Please follow up if there is no improvement with the steroids or if you have a worsening rash when steroids are completed.  Referred to allergist after discussion with pt  Return for As needed if not improving.    Michelene Cower, PA-C 06/24/24 10:20 AM

## 2024-06-24 NOTE — Patient Instructions (Signed)
 Recommend for the next couple weeks staying on a once daily 2nd generation antihistamine like zyrtec, xyzal (prescribed), or claritin and while you are very itchy right now you can also add pepcid  twice a day  Hydroxyzine  is still an option for severe itching You may also still use topical steroids Please follow up if there is no improvement with the steroids or if you have a worsening rash when steroids are completed.

## 2024-07-06 ENCOUNTER — Encounter: Payer: Self-pay | Admitting: Family Medicine

## 2024-07-07 NOTE — Telephone Encounter (Signed)
 Appt scheduled

## 2024-07-08 ENCOUNTER — Ambulatory Visit: Admitting: Family Medicine

## 2024-07-08 ENCOUNTER — Other Ambulatory Visit: Payer: Self-pay

## 2024-07-08 ENCOUNTER — Encounter: Payer: Self-pay | Admitting: Family Medicine

## 2024-07-08 VITALS — BP 126/74 | HR 62 | Resp 16 | Ht 69.0 in | Wt 218.0 lb

## 2024-07-08 DIAGNOSIS — R21 Rash and other nonspecific skin eruption: Secondary | ICD-10-CM

## 2024-07-08 DIAGNOSIS — M109 Gout, unspecified: Secondary | ICD-10-CM | POA: Diagnosis not present

## 2024-07-08 DIAGNOSIS — L308 Other specified dermatitis: Secondary | ICD-10-CM | POA: Diagnosis not present

## 2024-07-08 DIAGNOSIS — E538 Deficiency of other specified B group vitamins: Secondary | ICD-10-CM

## 2024-07-08 MED ORDER — PREDNISONE 10 MG PO TABS
ORAL_TABLET | ORAL | 0 refills | Status: AC
  Filled 2024-07-08: qty 21, 6d supply, fill #0

## 2024-07-08 MED ORDER — FLUCONAZOLE 150 MG PO TABS
ORAL_TABLET | ORAL | 0 refills | Status: DC
  Filled 2024-07-08: qty 15, 30d supply, fill #0

## 2024-07-08 MED ORDER — ALLOPURINOL 100 MG PO TABS
250.0000 mg | ORAL_TABLET | Freq: Every day | ORAL | 0 refills | Status: DC
Start: 2024-07-08 — End: 2024-10-20
  Filled 2024-07-08: qty 225, 90d supply, fill #0

## 2024-07-08 NOTE — Progress Notes (Signed)
 Patient ID: Oscar Allen, male    DOB: [birth year], 38 y.o.   MRN: [id number]  PCP: Sowles, Krichna, MD  Chief Complaint  Patient presents with   Rash    Not going away even after treatments, itching worse    Subjective:   Oscar Allen is a 38 y.o. male, presents to clinic with CC of the following:  HPI  Discussed the use of AI scribe software for clinical note transcription with the patient, who gave verbal consent to proceed.  History of Present Illness Oscar Allen is a 38 year old male who presents with a persistent rash and itching.  Pruritic rash - Persistent rash with significant pruritus since the last week of June 2025 - Rash continues to spread despite treatment - Rash located on the neck, chest, and back; spares the axillae - No associated tick bites, fevers, sweats, or body aches - No recent changes in personal care products or routines - No evidence of allergic trigger identified - Family history of psoriasis  Prior and current treatments for rash - Completed a 12-day oral steroid taper and received an injection, similar to prior poison ivy treatments - Pruritus recurred after the last dose of prednisone  - Applies hydrocortisone cream daily - Continues cetirizine and famotidine  (Pepcid ) as prescribed - Topical Lamisil  applied recently, with slight reduction in pruritus - Developed a bruise, unclear if related to scratching or another cause  Fungal exposure and antifungal therapy - History of exposure to fungal spores two years ago when moving into a new house - Required antifungal therapy in 2023, including oral Lamisil  and terbinafine  for a short duration - Recent use of topical Lamisil  for current rash  Gout management - Currently taking allopurinol  for gout - Colchicine  available for emergency use, but not used recently    Patient Active Problem List   Diagnosis Date Noted   Family history of rheumatoid arthritis  07/28/2018   Hyperuricemia 07/28/2018   Vitreous floater, bilateral 05/21/2018   Dry eye syndrome of both lacrimal glands 05/21/2018   Peroneal tendinitis 02/12/2018   Overweight (BMI 25.0-29.9) 09/18/2017   Bone spur of left ankle 03/28/2016   Insomnia, persistent 08/26/2015   History of hypertension 08/26/2015   Adult hypothyroidism 08/26/2015   B12 deficiency 08/26/2015   Gout of big toe 08/26/2015   Vitamin D  deficiency 08/26/2015      Current Outpatient Medications:    Acetaminophen  325 MG CAPS, Take 2 capsules by mouth as needed., Disp: , Rfl:    allopurinol  (ZYLOPRIM ) 100 MG tablet, Take 2.5 tablets (250 mg total) by mouth daily., Disp: 225 tablet, Rfl: 1   colchicine  0.6 MG tablet, Take 2 tablets (1.2 mg total) by mouth daily as needed (and repat in one hour if no resolution of pain)., Disp: 30 tablet, Rfl: 0   cyanocobalamin  (VITAMIN B12) 1000 MCG/ML injection, Inject 1 mL (1,000 mcg total) into the muscle every 14 (fourteen) days., Disp: 2 mL, Rfl: 5   famotidine  (PEPCID ) 20 MG tablet, Take 1 tablet (20 mg total) by mouth 2 (two) times daily as needed (allergic rxn rash)., Disp: 60 tablet, Rfl: 1   ibuprofen (ADVIL) 200 MG tablet, Take 2 tablets by mouth as needed., Disp: , Rfl:    levocetirizine (XYZAL ) 5 MG tablet, Take 1 tablet (5 mg total) by mouth every evening., Disp: 90 tablet, Rfl: 1   levothyroxine  (SYNTHROID ) 75 MCG tablet, Take 1 tablet (75 mcg total) by mouth daily., Disp: 90 tablet,  Rfl: 1   triamcinolone  cream (KENALOG ) 0.1 %, Apply 1 Application topically 2 (two) times daily., Disp: 454 g, Rfl: 0   Vitamin D , Ergocalciferol , (DRISDOL ) 1.25 MG (50000 UNIT) CAPS capsule, Take 1 capsule (50,000 Units total) by mouth every 7 (seven) days., Disp: 12 capsule, Rfl: 1   Allergies  Allergen Reactions   Rhus Coriaria [Sumac] Hives     Social History   Tobacco Use   Smoking status: Never   Smokeless tobacco: Never   Tobacco comments:    I don't smoke  Vaping  Use   Vaping status: Never Used  Substance Use Topics   Alcohol use: Yes    Alcohol/week: 2.0 - 3.0 standard drinks of alcohol    Types: 2 - 3 Standard drinks or equivalent per week    Comment: OCCASIONALLY   Drug use: No      Chart Review Today: I personally reviewed active problem list, medication list, allergies, family history, social history, health maintenance, notes from last encounter, lab results, imaging with the patient/caregiver today.   Review of Systems  Constitutional: Negative.  Negative for activity change, appetite change, chills, diaphoresis, fatigue, fever and unexpected weight change.  HENT: Negative.    Eyes: Negative.   Respiratory: Negative.    Cardiovascular: Negative.   Gastrointestinal: Negative.  Negative for abdominal pain.  Endocrine: Negative.   Genitourinary: Negative.   Musculoskeletal: Negative.  Negative for arthralgias, back pain, gait problem and joint swelling.  Skin:  Positive for rash. Negative for color change and wound.  Allergic/Immunologic: Negative.   Neurological: Negative.   Hematological: Negative.  Negative for adenopathy.  Psychiatric/Behavioral: Negative.    All other systems reviewed and are negative.      Objective:   Vitals:   07/08/24 1359  BP: 126/74  Pulse: 62  Resp: 16  SpO2: 98%  Weight: 218 lb (98.9 kg)  Height: 5' 9 (1.753 m)    Body mass index is 32.19 kg/m.  Physical Exam Vitals and nursing note reviewed.  Constitutional:      General: He is not in acute distress.    Appearance: Normal appearance. He is well-developed, well-groomed and overweight. He is not ill-appearing, toxic-appearing or diaphoretic.  HENT:     Head: Normocephalic and atraumatic.     Right Ear: External ear normal.     Left Ear: External ear normal.     Nose: Nose normal.  Eyes:     General: No scleral icterus.       Right eye: No discharge.        Left eye: No discharge.     Conjunctiva/sclera: Conjunctivae normal.   Neck:     Trachea: No tracheal deviation.  Cardiovascular:     Rate and Rhythm: Normal rate.  Pulmonary:     Effort: Pulmonary effort is normal. No respiratory distress.     Breath sounds: No stridor.  Skin:    General: Skin is warm and dry.     Findings: Rash present.     Comments: Scattered erythematous maculopapular rash to left neck, some improvement from past OV Left low abd/groin under belt line more concentrated erythematous rash with some bruising  No rash to arms/legs upper abd chest or back - distribution to skin folds neck/collar area   Neurological:     Mental Status: He is alert.     Motor: No abnormal muscle tone.     Coordination: Coordination normal.     Gait: Gait normal.  Psychiatric:  Mood and Affect: Mood normal.        Behavior: Behavior normal. Behavior is cooperative.      Results for orders placed or performed in visit on 10/09/23  Lipid panel   Collection Time: 10/16/23 11:23 AM  Result Value Ref Range   Cholesterol 200 (H) <200 mg/dL   HDL 62 > OR = 40 mg/dL   Triglycerides 66 <849 mg/dL   LDL Cholesterol (Calc) 122 (H) mg/dL (calc)   Total CHOL/HDL Ratio 3.2 <5.0 (calc)   Non-HDL Cholesterol (Calc) 138 (H) <130 mg/dL (calc)  CBC with Differential/Platelet   Collection Time: 10/16/23 11:23 AM  Result Value Ref Range   WBC 5.0 3.8 - 10.8 Thousand/uL   RBC 4.96 4.20 - 5.80 Million/uL   Hemoglobin 13.4 13.2 - 17.1 g/dL   HCT 58.7 61.4 - 49.9 %   MCV 83.1 80.0 - 100.0 fL   MCH 27.0 27.0 - 33.0 pg   MCHC 32.5 32.0 - 36.0 g/dL   RDW 84.8 (H) 88.9 - 84.9 %   Platelets 270 140 - 400 Thousand/uL   MPV 11.2 7.5 - 12.5 fL   Neutro Abs 2,230 1,500 - 7,800 cells/uL   Absolute Lymphocytes 2,190 850 - 3,900 cells/uL   Absolute Monocytes 350 200 - 950 cells/uL   Eosinophils Absolute 180 15 - 500 cells/uL   Basophils Absolute 50 0 - 200 cells/uL   Neutrophils Relative % 44.6 %   Total Lymphocyte 43.8 %   Monocytes Relative 7.0 %   Eosinophils  Relative 3.6 %   Basophils Relative 1.0 %  COMPLETE METABOLIC PANEL WITH GFR   Collection Time: 10/16/23 11:23 AM  Result Value Ref Range   Glucose, Bld 86 65 - 99 mg/dL   BUN 13 7 - 25 mg/dL   Creat 9.10 9.39 - 8.73 mg/dL   eGFR 887 > OR = 60 fO/fpw/8.26f7   BUN/Creatinine Ratio SEE NOTE: 6 - 22 (calc)   Sodium 140 135 - 146 mmol/L   Potassium 4.1 3.5 - 5.3 mmol/L   Chloride 109 98 - 110 mmol/L   CO2 26 20 - 32 mmol/L   Calcium 9.6 8.6 - 10.3 mg/dL   Total Protein 6.6 6.1 - 8.1 g/dL   Albumin 4.6 3.6 - 5.1 g/dL   Globulin 2.0 1.9 - 3.7 g/dL (calc)   AG Ratio 2.3 1.0 - 2.5 (calc)   Total Bilirubin 0.6 0.2 - 1.2 mg/dL   Alkaline phosphatase (APISO) 46 36 - 130 U/L   AST 16 10 - 40 U/L   ALT 21 9 - 46 U/L  TSH   Collection Time: 10/16/23 11:23 AM  Result Value Ref Range   TSH 1.79 0.40 - 4.50 mIU/L  VITAMIN D  25 Hydroxy (Vit-D Deficiency, Fractures)   Collection Time: 10/16/23 11:23 AM  Result Value Ref Range   Vit D, 25-Hydroxy 71 30 - 100 ng/mL  B12 and Folate Panel   Collection Time: 10/16/23 11:23 AM  Result Value Ref Range   Vitamin B-12 300 200 - 1,100 pg/mL   Folate 10.0 ng/mL  Hemoglobin A1c   Collection Time: 10/16/23 11:23 AM  Result Value Ref Range   Hgb A1c MFr Bld 5.2 <5.7 % of total Hgb   Mean Plasma Glucose 103 mg/dL   eAG (mmol/L) 5.7 mmol/L  Uric acid   Collection Time: 10/16/23 11:23 AM  Result Value Ref Range   Uric Acid, Serum 4.9 4.0 - 8.0 mg/dL       Assessment & Plan:  1. Rash and nonspecific skin eruption (Primary) Rash onset around June 24th, more than 2.5 weeks ago Persistent rash with pruritus, initially treated with a 12-day steroid taper and antihistamines. The rash continues to spread with associated ecchymosis, possibly due to excoriation. He has a history of fungal exposure and previous effective treatment with terbinafine . Differential diagnosis includes fungal infection, intertrigo, or other systemic causes. He has been using  topical Lamisil  with some relief. Considering the history of fungal exposure and previous antifungal efficacy, systemic antifungal treatment with Diflucan  is recommended. Diflucan  is considered safe without the need for liver enzyme monitoring unless pre-existing liver issues are present. He expressed concern about lab test costs due to insurance limitations. If no improvement, further lab tests for liver enzymes, immune cells, and platelets will be considered. He is advised to avoid colchicine  and Diflucan  together due to potential hepatic strain. - Prescribe Diflucan  for systemic antifungal treatment, daily for 7 days, then twice weekly for 4 weeks. - Prescribe a 6-day taper of prednisone  for pruritus relief. - Continue topical antifungal treatments. - Discuss using heat for pruritus alleviation. - Avoid colchicine  and Diflucan  together due to potential hepatic strain. - Consider alternative antifungal if no improvement by early next week. - Discuss affordable lab testing options due to high out-of-pocket costs. - fluconazole  (DIFLUCAN ) 150 MG tablet; Take one daily x 7 days then take two times weekly until supply is gone  Dispense: 15 tablet; Refill: 0 - predniSONE  (DELTASONE ) 10 MG tablet; Take 6 tablets (60 mg total) by mouth daily for 1 day, THEN 5 tablets (50 mg total) daily for 1 day, THEN 4 tablets (40 mg total) daily for 1 day, THEN 3 tablets (30 mg total) daily for 1 day, THEN 2 tablets (20 mg total) daily for 1 day, THEN 1 tablet (10 mg total) daily for 1 day. Take as directed on package.  (60 mg po on day 1, 50 mg po on day 2...).  Dispense: 21 tablet; Refill: 0  2. Pruritic dermatitis See #1 He is waiting for allergy specialists consult No labs Could consider switching antihistamines, continuing pepcid  prn, add singulair?  Though with no improvement I'm not sure his rash is allergic - will pursue the plan outlined above in #1 - predniSONE  (DELTASONE ) 10 MG tablet; Take 6 tablets (60 mg  total) by mouth daily for 1 day, THEN 5 tablets (50 mg total) daily for 1 day, THEN 4 tablets (40 mg total) daily for 1 day, THEN 3 tablets (30 mg total) daily for 1 day, THEN 2 tablets (20 mg total) daily for 1 day, THEN 1 tablet (10 mg total) daily for 1 day. Take as directed on package.  (60 mg po on day 1, 50 mg po on day 2...).  Dispense: 21 tablet; Refill: 0  3. Gout of big toe He asks for med refill - request was initially sent to pcp but I did send in 90 d supply Gout is well-managed with allopurinol . Requires allopurinol  refill. Advised against using colchicine  and Diflucan  together due to potential hepatic strain. - Refill allopurinol  prescription. - Avoid colchicine  and Diflucan  together. - allopurinol  (ZYLOPRIM ) 100 MG tablet; Take 2.5 tablets (250 mg total) by mouth daily.  Dispense: 225 tablet; Refill: 0   Assessment & Plan  General Health Maintenance He is considering delaying lab tests due to high out-of-pocket costs and is exploring options for more affordable testing. - Discuss affordable lab testing options, such as using [facility] cash pay for potentially lower cash  prices.  Recording duration: 19 minutes   Lab Results  Component Value Date   TSH 1.79 10/16/2023    Labs to check if not better - CBC w diff CMP possibly thyroid  but pt feels euthryoid and labs checked oct   F/up if not improving in the next week Could possibly get derm referral if no improvement  Michelene Cower, PA-C 07/08/24 2:05 PM

## 2024-07-08 NOTE — Patient Instructions (Signed)
 If this antifungal does not help we can try switching you to lamisil  (terbinafine ) since you used this in the past, but I would need to check some labs first.  Try the diflucan  daily x 7 days then decrease to about 2 doses a week for a few weeks  I would continue the antihistamines and pepcid  for now  Continue to try and keep your skin dry and avoid friction with clothes/sweating.  You can get and try over the counter antifungal powders.  And you may still try topical steroids to areas that are still itching.  If itching is severe you can do the steroid taper but I hope you can avoid systemic steroids.  If this doesn't improve we may want another provider to look at you for new ideas, or get you to dermatology or consider other testing in clinic.

## 2024-08-01 ENCOUNTER — Encounter: Payer: Self-pay | Admitting: Dermatology

## 2024-08-01 ENCOUNTER — Ambulatory Visit: Admitting: Dermatology

## 2024-08-01 ENCOUNTER — Other Ambulatory Visit: Payer: Self-pay

## 2024-08-01 DIAGNOSIS — D485 Neoplasm of uncertain behavior of skin: Secondary | ICD-10-CM

## 2024-08-01 DIAGNOSIS — R21 Rash and other nonspecific skin eruption: Secondary | ICD-10-CM

## 2024-08-01 DIAGNOSIS — L308 Other specified dermatitis: Secondary | ICD-10-CM

## 2024-08-01 DIAGNOSIS — D721 Eosinophilia, unspecified: Secondary | ICD-10-CM

## 2024-08-01 DIAGNOSIS — L309 Dermatitis, unspecified: Secondary | ICD-10-CM | POA: Diagnosis not present

## 2024-08-01 MED ORDER — CLOBETASOL PROPIONATE 0.05 % EX CREA
1.0000 | TOPICAL_CREAM | Freq: Two times a day (BID) | CUTANEOUS | 0 refills | Status: AC
  Filled 2024-08-01: qty 30, 30d supply, fill #0

## 2024-08-01 NOTE — Progress Notes (Unsigned)
   Follow-Up Visit   Subjective  Oscar Allen is a 38 y.o. male who presents for the following: itchy rash that started at left neck about 2 1/2 months ago and has spread now to waistline and flank. Patient has had 2 prednisone  tapers and is taking fluconazole  as well as ketoconazole  2% and TMC 0.1% creams. PCP recommend patient see allergist but patient wanted to see derm first.  Patient had a similar rash a few years ago after crawling under house that was believed to be fungal and was treated with terbinafine  which helped.   No recent illnesses.  The following portions of the chart were reviewed this encounter and updated as appropriate: medications, allergies, medical history  Review of Systems:  No other skin or systemic complaints except as noted in HPI or Assessment and Plan.  Objective  Well appearing patient in no apparent distress; mood and affect are within normal limits.   A focused examination was performed of the following areas: trunk  Relevant exam findings are noted in the Assessment and Plan.  right hip Pink blanchable patch   Assessment & Plan     RASH AND OTHER NONSPECIFIC SKIN ERUPTION right hip Start clobetasol  0.05% cr twice daily for up to 1 week prn. Avoid applying to face, groin, and axilla. Use as directed. Long-term use can cause thinning of the skin. Stop antifungals Topical steroids (such as triamcinolone , fluocinolone, fluocinonide, mometasone, clobetasol , halobetasol, betamethasone, hydrocortisone) can cause thinning and lightening of the skin if they are used for too long in the same area. Your physician has selected the right strength medicine for your problem and area affected on the body. Please use your medication only as directed by your physician to prevent side effects.   Patient will take sutures out at home in 1-2 weeks.  Skin / nail biopsy - right hip Type of biopsy: punch   Informed consent: discussed and consent obtained    Timeout: patient name, date of birth, surgical site, and procedure verified   Procedure prep:  Patient was prepped and draped in usual sterile fashion Prep type:  Isopropyl alcohol Anesthesia: the lesion was anesthetized in a standard fashion   Anesthetic:  1% lidocaine w/ epinephrine 1-100,000 buffered w/ 8.4% NaHCO3 Punch size:  3 mm Suture size:  4-0 Suture type: Prolene (polypropylene)   Suture removal (days):  7 Hemostasis achieved with: suture, pressure and aluminum chloride   Outcome: patient tolerated procedure well   Post-procedure details: sterile dressing applied and wound care instructions given   Dressing type: bandage and petrolatum    Specimen 1 - Surgical pathology Differential Diagnosis: viral exanthem vs eczema vs urticaria vs intertrigo  Check Margins: No  No follow-ups on file.  Oscar Allen Oscar Allen, RMA, am acting as scribe for Oscar Sharps, MD .   Documentation: I have reviewed the above documentation for accuracy and completeness, and I agree with the above.  Oscar Sharps, MD

## 2024-08-02 ENCOUNTER — Encounter: Payer: Self-pay | Admitting: Dermatology

## 2024-08-05 ENCOUNTER — Ambulatory Visit: Admitting: Internal Medicine

## 2024-08-08 ENCOUNTER — Other Ambulatory Visit: Payer: Self-pay

## 2024-08-08 ENCOUNTER — Other Ambulatory Visit: Payer: Self-pay | Admitting: Family Medicine

## 2024-08-08 DIAGNOSIS — M109 Gout, unspecified: Secondary | ICD-10-CM

## 2024-08-08 MED ORDER — COLCHICINE 0.6 MG PO TABS
1.2000 mg | ORAL_TABLET | Freq: Every day | ORAL | 0 refills | Status: AC | PRN
Start: 2024-08-08 — End: ?
  Filled 2024-08-08: qty 30, 15d supply, fill #0

## 2024-08-08 NOTE — Telephone Encounter (Signed)
 Sent pt a message to have him schedule.

## 2024-08-08 NOTE — Telephone Encounter (Signed)
 Pt needs follow up scheduled

## 2024-08-09 LAB — SURGICAL PATHOLOGY

## 2024-08-10 ENCOUNTER — Telehealth: Payer: Self-pay | Admitting: Dermatology

## 2024-08-10 ENCOUNTER — Ambulatory Visit: Payer: Self-pay | Admitting: Dermatology

## 2024-08-10 ENCOUNTER — Other Ambulatory Visit: Payer: Self-pay

## 2024-08-10 NOTE — Telephone Encounter (Signed)
 Spoke to patient. Biopsy shows hypersensitivity. Suspect drug reaction  Rash better clobetasol . No itching 1 week. Redness gone  No new meds. Only med is allopurinol  and levothyroxine  and vit D Hasn't taken colchicine .  Recently started Diff brand of med allopurinol  09/2023 3/5 7/16 vs levothy 4/25 7/25 same last 2 fills   If rash doesn't recur then likely viral. If current med is a trigger then re-exposure will trigger rash. Patient will contact us  if this occurs via [employer], levothy unchanged. Allop unchanged. Other fills pred pepcid  xyzal  vit D. On qsymia  until feb name brand only

## 2024-09-06 DIAGNOSIS — H5213 Myopia, bilateral: Secondary | ICD-10-CM | POA: Diagnosis not present

## 2024-09-28 ENCOUNTER — Other Ambulatory Visit: Payer: Self-pay | Admitting: Family Medicine

## 2024-09-28 ENCOUNTER — Other Ambulatory Visit: Payer: Self-pay

## 2024-09-28 DIAGNOSIS — E039 Hypothyroidism, unspecified: Secondary | ICD-10-CM

## 2024-10-17 ENCOUNTER — Other Ambulatory Visit: Payer: Self-pay | Admitting: Family Medicine

## 2024-10-17 ENCOUNTER — Other Ambulatory Visit: Payer: Self-pay

## 2024-10-17 DIAGNOSIS — E039 Hypothyroidism, unspecified: Secondary | ICD-10-CM

## 2024-10-17 DIAGNOSIS — M109 Gout, unspecified: Secondary | ICD-10-CM

## 2024-10-18 ENCOUNTER — Other Ambulatory Visit: Payer: Self-pay

## 2024-10-18 ENCOUNTER — Telehealth: Payer: Self-pay

## 2024-10-18 NOTE — Telephone Encounter (Signed)
 Copied from CRM [id number]. Topic: Clinical - Prescription Issue >> Oct 18, 2024  4:15 PM Amy B wrote: Reason for CRM: Patient received the following message:  The refills for your prescriptions for allopurinol  and levothyroxine  have been denied by your provider.  Please contact your provider with any questions.  He would like to know why his medications were denied and requests a call back to discuss.  [phone]

## 2024-10-18 NOTE — Telephone Encounter (Signed)
 Pt has not been seen since 09/2023, I advised pt needed to be seen for regular follow up for more refills. Pt is upset since he has requested multiple times previously and no one had reached out to him to let him know. Pt states he does not need to come in for a follow up, he tends to only do CPE appt. Can PCP do split visit? Pt would like to hear from Manager.

## 2024-10-20 ENCOUNTER — Telehealth: Payer: Self-pay

## 2024-10-20 ENCOUNTER — Other Ambulatory Visit: Payer: Self-pay

## 2024-10-20 DIAGNOSIS — M109 Gout, unspecified: Secondary | ICD-10-CM

## 2024-10-20 DIAGNOSIS — E039 Hypothyroidism, unspecified: Secondary | ICD-10-CM

## 2024-10-20 MED ORDER — LEVOTHYROXINE SODIUM 75 MCG PO TABS
75.0000 ug | ORAL_TABLET | Freq: Every day | ORAL | 0 refills | Status: DC
  Filled 2024-10-20: qty 30, 30d supply, fill #0

## 2024-10-20 MED ORDER — ALLOPURINOL 100 MG PO TABS
250.0000 mg | ORAL_TABLET | Freq: Every day | ORAL | 0 refills | Status: DC
  Filled 2024-10-20: qty 30, 12d supply, fill #0

## 2024-10-20 NOTE — Telephone Encounter (Signed)
 Pt called confused, as to why his medication had been denied and no one called regarding.  Pt states he was under the impression that Dr. Glenard only needed to see him every year. I told him since out I would give 30 day supply and reach out to you weather he could wait til his CPE in [place] or if you needed to see him sooner?  Please clarify if you need to see him once a yr or every 6 months?  I told patient I will notify once you are back in office.

## 2024-10-24 ENCOUNTER — Other Ambulatory Visit: Payer: Self-pay | Admitting: Family Medicine

## 2024-10-24 DIAGNOSIS — E039 Hypothyroidism, unspecified: Secondary | ICD-10-CM

## 2024-10-24 MED ORDER — LEVOTHYROXINE SODIUM 75 MCG PO TABS
75.0000 ug | ORAL_TABLET | Freq: Every day | ORAL | 0 refills | Status: AC
Start: 2024-10-24 — End: 2025-10-24
  Filled 2024-10-24 – 2024-11-18 (×2): qty 90, 90d supply, fill #0

## 2024-10-25 ENCOUNTER — Other Ambulatory Visit: Payer: Self-pay

## 2024-11-18 ENCOUNTER — Other Ambulatory Visit: Payer: Self-pay

## 2024-11-18 ENCOUNTER — Other Ambulatory Visit: Payer: Self-pay | Admitting: Family Medicine

## 2024-11-18 DIAGNOSIS — M109 Gout, unspecified: Secondary | ICD-10-CM

## 2024-11-20 NOTE — Telephone Encounter (Signed)
 Requested medication (s) are due for refill today - yes  Requested medication (s) are on the active medication list -yues  Future visit scheduled -yes  Last refill: 10/20/24 #30  Notes to clinic: fails lab protocol-over 1 year-10/16/23  Requested Prescriptions  Pending Prescriptions Disp Refills   allopurinol  (ZYLOPRIM ) 100 MG tablet 30 tablet 0    Sig: Take 2.5 tablets (250 mg total) by mouth daily.     Endocrinology:  Gout Agents - allopurinol  Failed - 11/20/2024 11:24 AM      Failed - Uric Acid in normal range and within 360 days    Uric Acid, Serum  Date Value Ref Range Status  10/16/2023 4.9 4.0 - 8.0 mg/dL Final    Comment:    Therapeutic target for gout patients: <6.0 mg/dL .    Uric Acid  Date Value Ref Range Status  08/31/2015 9.4 (H) 3.7 - 8.6 mg/dL Final    Comment:               Therapeutic target for gout patients: <6.0         Failed - Cr in normal range and within 360 days    Creat  Date Value Ref Range Status  10/16/2023 0.89 0.60 - 1.26 mg/dL Final         Failed - CBC within normal limits and completed in the last 12 months    WBC  Date Value Ref Range Status  10/16/2023 5.0 3.8 - 10.8 Thousand/uL Final   RBC  Date Value Ref Range Status  10/16/2023 4.96 4.20 - 5.80 Million/uL Final   Hemoglobin  Date Value Ref Range Status  10/16/2023 13.4 13.2 - 17.1 g/dL Final  88/95/7983 85.2 12.6 - 17.7 g/dL Final   HCT  Date Value Ref Range Status  10/16/2023 41.2 38.5 - 50.0 % Final   Hematocrit  Date Value Ref Range Status  11/02/2015 43.7 37.5 - 51.0 % Final   MCHC  Date Value Ref Range Status  10/16/2023 32.5 32.0 - 36.0 g/dL Final    Comment:    For adults, a slight decrease in the calculated MCHC value (in the range of 30 to 32 g/dL) is most likely not clinically significant; however, it should be interpreted with caution in correlation with other red cell parameters and the patient's clinical condition.    [facility]  Date Value Ref  Range Status  10/16/2023 27.0 27.0 - 33.0 pg Final   MCV  Date Value Ref Range Status  10/16/2023 83.1 80.0 - 100.0 fL Final  11/02/2015 81 79 - 97 fL Final   No results found for: PLTCOUNTKUC, LABPLAT, POCPLA RDW  Date Value Ref Range Status  10/16/2023 15.1 (H) 11.0 - 15.0 % Final  11/02/2015 14.0 12.3 - 15.4 % Final         Passed - Valid encounter within last 12 months    Recent Outpatient Visits           4 months ago Rash and nonspecific skin eruption   [facility] Leavy Mole, PA-C   4 months ago Rash and nonspecific skin eruption   [facility] Leavy Mole, [place]       Future Appointments             In 3 months Hester Alm BROCKS, MD [facility]               Requested Prescriptions  Pending Prescriptions Disp Refills  allopurinol  (ZYLOPRIM ) 100 MG tablet 30 tablet 0    Sig: Take 2.5 tablets (250 mg total) by mouth daily.     Endocrinology:  Gout Agents - allopurinol  Failed - 11/20/2024 11:24 AM      Failed - Uric Acid in normal range and within 360 days    Uric Acid, Serum  Date Value Ref Range Status  10/16/2023 4.9 4.0 - 8.0 mg/dL Final    Comment:    Therapeutic target for gout patients: <6.0 mg/dL .    Uric Acid  Date Value Ref Range Status  08/31/2015 9.4 (H) 3.7 - 8.6 mg/dL Final    Comment:               Therapeutic target for gout patients: <6.0         Failed - Cr in normal range and within 360 days    Creat  Date Value Ref Range Status  10/16/2023 0.89 0.60 - 1.26 mg/dL Final         Failed - CBC within normal limits and completed in the last 12 months    WBC  Date Value Ref Range Status  10/16/2023 5.0 3.8 - 10.8 Thousand/uL Final   RBC  Date Value Ref Range Status  10/16/2023 4.96 4.20 - 5.80 Million/uL Final   Hemoglobin  Date Value Ref Range Status  10/16/2023 13.4 13.2 - 17.1 g/dL Final  88/95/7983 85.2 12.6 - 17.7 g/dL Final    HCT  Date Value Ref Range Status  10/16/2023 41.2 38.5 - 50.0 % Final   Hematocrit  Date Value Ref Range Status  11/02/2015 43.7 37.5 - 51.0 % Final   MCHC  Date Value Ref Range Status  10/16/2023 32.5 32.0 - 36.0 g/dL Final    Comment:    For adults, a slight decrease in the calculated MCHC value (in the range of 30 to 32 g/dL) is most likely not clinically significant; however, it should be interpreted with caution in correlation with other red cell parameters and the patient's clinical condition.    [facility]  Date Value Ref Range Status  10/16/2023 27.0 27.0 - 33.0 pg Final   MCV  Date Value Ref Range Status  10/16/2023 83.1 80.0 - 100.0 fL Final  11/02/2015 81 79 - 97 fL Final   No results found for: PLTCOUNTKUC, LABPLAT, POCPLA RDW  Date Value Ref Range Status  10/16/2023 15.1 (H) 11.0 - 15.0 % Final  11/02/2015 14.0 12.3 - 15.4 % Final         Passed - Valid encounter within last 12 months    Recent Outpatient Visits           4 months ago Rash and nonspecific skin eruption   [facility] Leavy Mole, PA-C   4 months ago Rash and nonspecific skin eruption   [facility] Leavy Mole, [place]       Future Appointments             In 3 months Hester Alm BROCKS, MD [facility]

## 2024-11-21 ENCOUNTER — Other Ambulatory Visit: Payer: Self-pay

## 2024-11-21 MED ORDER — ALLOPURINOL 100 MG PO TABS
250.0000 mg | ORAL_TABLET | Freq: Every day | ORAL | 0 refills | Status: AC
Start: 2024-11-21 — End: ?
  Filled 2024-11-21: qty 30, 12d supply, fill #0

## 2024-11-21 NOTE — Telephone Encounter (Signed)
 HAS APPT 1/9

## 2024-12-09 ENCOUNTER — Other Ambulatory Visit: Payer: Self-pay

## 2024-12-09 ENCOUNTER — Ambulatory Visit: Admitting: Family Medicine

## 2024-12-09 ENCOUNTER — Encounter: Payer: Self-pay | Admitting: Family Medicine

## 2024-12-09 VITALS — BP 128/74 | HR 76 | Ht 69.0 in | Wt 232.0 lb

## 2024-12-09 DIAGNOSIS — Z6834 Body mass index (BMI) 34.0-34.9, adult: Secondary | ICD-10-CM

## 2024-12-09 DIAGNOSIS — Z0000 Encounter for general adult medical examination without abnormal findings: Secondary | ICD-10-CM | POA: Diagnosis not present

## 2024-12-09 DIAGNOSIS — G4700 Insomnia, unspecified: Secondary | ICD-10-CM | POA: Diagnosis not present

## 2024-12-09 DIAGNOSIS — E039 Hypothyroidism, unspecified: Secondary | ICD-10-CM | POA: Diagnosis not present

## 2024-12-09 DIAGNOSIS — M1A00X0 Idiopathic chronic gout, unspecified site, without tophus (tophi): Secondary | ICD-10-CM | POA: Diagnosis not present

## 2024-12-09 DIAGNOSIS — R198 Other specified symptoms and signs involving the digestive system and abdomen: Secondary | ICD-10-CM | POA: Diagnosis not present

## 2024-12-10 LAB — COMPREHENSIVE METABOLIC PANEL WITH GFR
ALT: 29 IU/L (ref 0–44)
AST: 22 IU/L (ref 0–40)
Albumin: 4.9 g/dL (ref 4.1–5.1)
Alkaline Phosphatase: 44 IU/L — ABNORMAL LOW (ref 47–123)
BUN/Creatinine Ratio: 13 (ref 9–20)
BUN: 13 mg/dL (ref 6–20)
Bilirubin Total: 0.5 mg/dL (ref 0.0–1.2)
CO2: 25 mmol/L (ref 20–29)
Calcium: 9.7 mg/dL (ref 8.7–10.2)
Chloride: 106 mmol/L (ref 96–106)
Creatinine, Ser: 1.03 mg/dL (ref 0.76–1.27)
Globulin, Total: 1.9 g/dL (ref 1.5–4.5)
Glucose: 94 mg/dL (ref 70–99)
Potassium: 4.1 mmol/L (ref 3.5–5.2)
Sodium: 143 mmol/L (ref 134–144)
Total Protein: 6.8 g/dL (ref 6.0–8.5)
eGFR: 95 mL/min/1.73 (ref >59)

## 2024-12-10 LAB — CBC
Hematocrit: 44.9 % (ref 37.5–51.0)
Hemoglobin: 14.9 g/dL (ref 13.0–17.7)
MCH: 28.7 pg (ref 26.6–33.0)
MCHC: 33.2 g/dL (ref 31.5–35.7)
MCV: 87 fL (ref 79–97)
Platelets: 265 x10E3/uL (ref 150–450)
RBC: 5.19 x10E6/uL (ref 4.14–5.80)
RDW: 13.3 % (ref 11.6–15.4)
WBC: 7 x10E3/uL (ref 3.4–10.8)

## 2024-12-10 LAB — HEMOGLOBIN A1C
Est. average glucose Bld gHb Est-mCnc: 100 mg/dL
Hgb A1c MFr Bld: 5.1 % (ref 4.8–5.6)

## 2024-12-10 LAB — LIPID PANEL
Chol/HDL Ratio: 3.2 ratio (ref 0.0–5.0)
Cholesterol, Total: 213 mg/dL — ABNORMAL HIGH (ref 100–199)
HDL: 66 mg/dL (ref >39)
LDL Chol Calc (NIH): 116 mg/dL — ABNORMAL HIGH (ref 0–99)
Triglycerides: 180 mg/dL — ABNORMAL HIGH (ref 0–149)
VLDL Cholesterol Cal: 31 mg/dL (ref 5–40)

## 2024-12-10 LAB — AMYLASE: Amylase: 44 U/L (ref 31–110)

## 2024-12-10 LAB — LIPASE: Lipase: 26 U/L (ref 13–78)

## 2024-12-10 LAB — TSH+T4F+T3FREE
Free T4: 1.56 ng/dL (ref 0.82–1.77)
T3, Free: 3.3 pg/mL (ref 2.0–4.4)
TSH: 2.28 u[IU]/mL (ref 0.450–4.500)

## 2024-12-12 ENCOUNTER — Other Ambulatory Visit: Payer: Self-pay | Admitting: Family Medicine

## 2024-12-12 ENCOUNTER — Other Ambulatory Visit: Payer: Self-pay

## 2024-12-12 ENCOUNTER — Ambulatory Visit: Payer: Self-pay | Admitting: Family Medicine

## 2024-12-12 DIAGNOSIS — M109 Gout, unspecified: Secondary | ICD-10-CM

## 2024-12-12 DIAGNOSIS — E039 Hypothyroidism, unspecified: Secondary | ICD-10-CM

## 2024-12-12 DIAGNOSIS — R198 Other specified symptoms and signs involving the digestive system and abdomen: Secondary | ICD-10-CM | POA: Insufficient documentation

## 2024-12-12 MED ORDER — TRAZODONE HCL 50 MG PO TABS
25.0000 mg | ORAL_TABLET | Freq: Every evening | ORAL | 0 refills | Status: AC | PRN
  Filled 2024-12-12: qty 30, 30d supply, fill #0

## 2024-12-12 NOTE — Progress Notes (Signed)
 Primary Care / Sports Medicine Office Visit  Patient Information:  Patient ID: Oscar Allen, male DOB: [id number] Age: 38 y.o. MRN: [id number]   Oscar Allen is a pleasant 38 y.o. male presenting with the following:  Chief Complaint  Patient presents with   Establish Care    Patient presents today to establish care. He would like to discuss GI referral  and medication refills.     Vitals:   12/09/24 1409  BP: 128/74  Pulse: 76  SpO2: 98%   Vitals:   12/09/24 1409  Weight: 232 lb (105.2 kg)  Height: 5' 9 (1.753 m)   Body mass index is 34.26 kg/m.  No results found.   Discussed the use of AI scribe software for clinical note transcription with the patient, who gave verbal consent to proceed.   Independent interpretation of notes and tests performed by another provider:   None  Procedures performed:   None  Pertinent History, Exam, Impression, and Recommendations:   History of Present Illness Oscar Allen is a 38 year old male who presents for an annual physical exam and to address gastrointestinal concerns.  Gastrointestinal symptoms - Lethargy and gastrointestinal symptoms, including bloating and fullness, most pronounced in the mornings, described as feeling as if he has had a 'three course meal' - Symptoms have persisted for a couple of years - Bowel movements are inconsistent, alternating between constipation and diarrhea - No blood in stool - Symptoms have persisted despite previous weight loss efforts and medication use (tirzepatide , Qsymia ) - Fiber supplements such as Metamucil have worsened symptoms - Low FODMAP diet and pre/postbiotics used for 3-4 weeks with difficulty maintaining consistency at every meal  Sleep disturbances and night sweats - Difficulty staying asleep - Night sweats and describes himself as a 'hot sleeper' - Sleep study performed approximately six years ago did not indicate need for CPAP -  Melatonin used to assist with sleep initiation  Weight management and medication history - Previous use of tirzepatide  and Qsymia  for weight loss, discontinued due to cost and side effects - No improvement in gastrointestinal symptoms with these medications  Thyroid  dysfunction - History of hypothyroidism - Currently taking levothyroxine  0.75 mg - Thyroid  levels stable when last checked in 2024  Gout - History of gout - Currently taking allopurinol  - Colchicine  available for flares, but no recent flare  Alcohol use - Enjoys bourbon, consuming approximately three ounces every couple of days - Increased involvement in this hobby over the past year  Cancer risk concerns - Family history of pancreatic cancer (maternal grandfather) and liver cancer (paternal grandfather) - No family history of colon cancer - Wife concerned about possibility of colon cancer due to gastrointestinal symptoms  Physical Exam CHEST: Lungs clear. CARDIOVASCULAR: Heart sounds normal. ABDOMEN: Abdomen soft, minimally tender in the left upper and left lower quadrants without rebound. Normal active bowel sounds. No hepatomegaly.  Assessment and Plan Alternating diarrhea and constipation Chronic alternating diarrhea and constipation. Symptoms worsened post-tirzepatide . Possible IBS-M. - Initiated low FODMAP diet. - Referred to GI specialist. - Prescribed Miralax as needed. - Prescribed pantoprazole. - Advised to avoid Metamucil.  Adult hypothyroidism Long-standing hypothyroidism managed with levothyroxine . Current symptoms may not be thyroid -related. - Ordered TSH, T4, and T3 levels. - Continue levothyroxine  0.75 mg daily.  Insomnia Chronic insomnia with stress and poor sleep architecture contributing. Previous sleep study negative for apnea. - Prescribed trazodone  50 mg, start with half tablet at bedtime. - Advised to monitor  sleep patterns and adjust trazodone  as needed.  Gout Well-controlled, no  recent flares. - Refilled allopurinol  for 90 days. - Advised to use colchicine  as needed for flares.  Body mass index is 34.26 kg/m. Weight gain over eight years. Previous weight loss attempts partially successful but discontinued. - Encouraged dietary modifications for inflammation reduction and weight loss. - Advised to monitor weight and adjust lifestyle interventions.  Problem List Items Addressed This Visit     Adult hypothyroidism   Relevant Orders   TSH+T4F+T3Free (Completed)   BMI 34.0-34.9,adult   Gastrointestinal symptoms - Primary   Gout   Insomnia, persistent   Other Visit Diagnoses       Healthcare maintenance       Relevant Orders   CBC (Completed)   Comprehensive metabolic panel with GFR (Completed)   Hemoglobin A1c (Completed)   Lipid panel (Completed)   Amylase (Completed)   Lipase (Completed)   TSH+T4F+T3Free (Completed)        Orders & Medications Medications: No orders of the defined types were placed in this encounter.  Orders Placed This Encounter  Procedures   CBC   Comprehensive metabolic panel with GFR   Hemoglobin A1c   Lipid panel   Amylase   Lipase   TSH+T4F+T3Free     No follow-ups on file.     Selinda JINNY Ku, MD, [facility]   Primary Care Sports Medicine Primary [facility] and [facility] Medicine at [facility]

## 2024-12-12 NOTE — Patient Instructions (Addendum)
 VISIT SUMMARY:  Today, you had your annual physical exam and discussed your gastrointestinal symptoms, sleep disturbances, weight management, and other health concerns. We have made some adjustments to your treatment plan and provided new prescriptions to help manage your symptoms.  YOUR PLAN:  GI SYMPTOMS: You have chronic alternating diarrhea and constipation, which may be due to irritable bowel syndrome (IBS). -Start a low FODMAP diet to help manage your symptoms. -You have been referred to a GI specialist for further evaluation. -Take Miralax as needed for constipation. -Start taking pantoprazole as prescribed. -Avoid using Metamucil as it worsens your symptoms.  HYPOTHYROIDISM: You have a history of hypothyroidism, which is currently managed with levothyroxine . -Continue taking levothyroxine  0.75 mg daily. -We have ordered blood tests to check your thyroid  levels (TSH, T4, and T3).  INSOMNIA: You have difficulty staying asleep and experience night sweats. -Start taking trazodone  50 mg, beginning with half a tablet at bedtime. -Monitor your sleep patterns and adjust the trazodone  dosage as needed.  GOUT: Your gout is well-controlled with no recent flares. -Continue taking allopurinol  as prescribed. Your prescription has been refilled for 90 days. -Use colchicine  as needed if you experience a gout flare.  Body mass index is 34.26 kg/m.: You have experienced weight gain over the past eight years and have had partial success with previous weight loss attempts. -Make dietary modifications to help reduce inflammation and support weight loss. -Monitor your weight and adjust your lifestyle interventions as needed.

## 2024-12-15 ENCOUNTER — Other Ambulatory Visit: Payer: Self-pay

## 2024-12-15 MED FILL — Levothyroxine Sodium Tab 75 MCG: ORAL | 90 days supply | Qty: 90 | Fill #0 | Status: CN

## 2024-12-15 MED FILL — Allopurinol Tab 100 MG: ORAL | 90 days supply | Qty: 225 | Fill #0 | Status: AC

## 2024-12-15 NOTE — Telephone Encounter (Signed)
 Requested Prescriptions  Pending Prescriptions Disp Refills   allopurinol  (ZYLOPRIM ) 100 MG tablet 90 tablet 2    Sig: Take 2.5 tablets (250 mg total) by mouth daily.     Endocrinology:  Gout Agents - allopurinol  Failed - 12/15/2024  1:24 PM      Failed - Uric Acid in normal range and within 360 days    Uric Acid, Serum  Date Value Ref Range Status  10/16/2023 4.9 4.0 - 8.0 mg/dL Final    Comment:    Therapeutic target for gout patients: <6.0 mg/dL .    Uric Acid  Date Value Ref Range Status  08/31/2015 9.4 (H) 3.7 - 8.6 mg/dL Final    Comment:               Therapeutic target for gout patients: <6.0         Failed - CBC within normal limits and completed in the last 12 months    WBC  Date Value Ref Range Status  12/09/2024 7.0 3.4 - 10.8 x10E3/uL Final  10/16/2023 5.0 3.8 - 10.8 Thousand/uL Final   RBC  Date Value Ref Range Status  12/09/2024 5.19 4.14 - 5.80 x10E6/uL Final  10/16/2023 4.96 4.20 - 5.80 Million/uL Final   Hemoglobin  Date Value Ref Range Status  12/09/2024 14.9 13.0 - 17.7 g/dL Final   Hematocrit  Date Value Ref Range Status  12/09/2024 44.9 37.5 - 51.0 % Final   MCHC  Date Value Ref Range Status  12/09/2024 33.2 31.5 - 35.7 g/dL Final  89/81/7975 67.4 32.0 - 36.0 g/dL Final    Comment:    For adults, a slight decrease in the calculated MCHC value (in the range of 30 to 32 g/dL) is most likely not clinically significant; however, it should be interpreted with caution in correlation with other red cell parameters and the patient's clinical condition.    [facility]  Date Value Ref Range Status  12/09/2024 28.7 26.6 - 33.0 pg Final  10/16/2023 27.0 27.0 - 33.0 pg Final   MCV  Date Value Ref Range Status  12/09/2024 87 79 - 97 fL Final   No results found for: PLTCOUNTKUC, LABPLAT, POCPLA RDW  Date Value Ref Range Status  12/09/2024 13.3 11.6 - 15.4 % Final         Passed - Cr in normal range and within 360 days    Creat  Date Value  Ref Range Status  10/16/2023 0.89 0.60 - 1.26 mg/dL Final   Creatinine, Ser  Date Value Ref Range Status  12/09/2024 1.03 0.76 - 1.27 mg/dL Final         Passed - Valid encounter within last 12 months    Recent Outpatient Visits           6 days ago Gastrointestinal symptoms   [facility] Al[facility], Selinda PARAS, MD   5 months ago Rash and nonspecific skin eruption   [facility] Leavy Mole, PA-C   5 months ago Rash and nonspecific skin eruption   [facility] Leavy Mole, PA-C       Future Appointments             In 2 months Hester Alm BROCKS, MD [facility]             levothyroxine  (SYNTHROID ) 75 MCG tablet 90 tablet 0    Sig: Take 1 tablet (75 mcg total) by  mouth daily.     Endocrinology:  Hypothyroid Agents Passed - 12/15/2024  1:24 PM      Passed - TSH in normal range and within 360 days    TSH  Date Value Ref Range Status  12/09/2024 2.280 0.450 - 4.500 uIU/mL Final         Passed - Valid encounter within last 12 months    Recent Outpatient Visits           6 days ago Gastrointestinal symptoms   [place] [facility], Selinda PARAS, MD   5 months ago Rash and nonspecific skin eruption   [facility] Leavy Mole, PA-C   5 months ago Rash and nonspecific skin eruption   [facility] Leavy Mole, [place]       Future Appointments             In 2 months Hester Alm BROCKS, MD [facility]

## 2024-12-26 ENCOUNTER — Other Ambulatory Visit: Payer: Self-pay

## 2024-12-26 ENCOUNTER — Encounter: Payer: Self-pay | Admitting: Family Medicine

## 2024-12-26 DIAGNOSIS — R198 Other specified symptoms and signs involving the digestive system and abdomen: Secondary | ICD-10-CM

## 2024-12-26 NOTE — Progress Notes (Signed)
 Referral placed on behalf of Dr. Alvia. Patient discussed referral to GI with Dr. Alvia. Dr. Alvia agreed to place referral.  ONA

## 2025-01-06 ENCOUNTER — Encounter: Admitting: Family Medicine

## 2025-02-23 ENCOUNTER — Ambulatory Visit: Payer: Commercial Managed Care - PPO | Admitting: Dermatology

## 2025-02-23 ENCOUNTER — Ambulatory Visit: Admitting: Dermatology

## 2025-03-02 ENCOUNTER — Ambulatory Visit: Admitting: Gastroenterology

## 2025-03-10 ENCOUNTER — Encounter: Admitting: Family Medicine

## 2026-02-22 ENCOUNTER — Ambulatory Visit: Admitting: Dermatology
